# Patient Record
Sex: Female | Born: 1945
Health system: Southern US, Community
[De-identification: ages and names within clinical notes are randomized; demographics above are authoritative.]

## PROBLEM LIST (undated history)

## (undated) DIAGNOSIS — G35 Multiple sclerosis: Secondary | ICD-10-CM

## (undated) DIAGNOSIS — M810 Age-related osteoporosis without current pathological fracture: Secondary | ICD-10-CM

## (undated) DIAGNOSIS — E119 Type 2 diabetes mellitus without complications: Secondary | ICD-10-CM

## (undated) DIAGNOSIS — F329 Major depressive disorder, single episode, unspecified: Secondary | ICD-10-CM

## (undated) DIAGNOSIS — K219 Gastro-esophageal reflux disease without esophagitis: Secondary | ICD-10-CM

## (undated) DIAGNOSIS — F32A Depression, unspecified: Secondary | ICD-10-CM

## (undated) DIAGNOSIS — E785 Hyperlipidemia, unspecified: Secondary | ICD-10-CM

## (undated) HISTORY — PX: HIP FRACTURE SURGERY: SHX118

## (undated) HISTORY — DX: Multiple sclerosis: G35

## (undated) HISTORY — DX: Major depressive disorder, single episode, unspecified: F32.9

## (undated) HISTORY — DX: Depression, unspecified: F32.A

## (undated) HISTORY — DX: Gastro-esophageal reflux disease without esophagitis: K21.9

## (undated) HISTORY — DX: Hyperlipidemia, unspecified: E78.5

---

## 2017-10-23 ENCOUNTER — Other Ambulatory Visit: Payer: Self-pay

## 2017-10-23 ENCOUNTER — Emergency Department (HOSPITAL_COMMUNITY)
Admission: EM | Admit: 2017-10-23 | Discharge: 2017-10-23 | Disposition: A | Discharge status: Home / Self Care | Payer: Medicare Other | Attending: Emergency Medicine | Admitting: Emergency Medicine

## 2017-10-23 ENCOUNTER — Emergency Department (HOSPITAL_COMMUNITY): Payer: Medicare Other

## 2017-10-23 ENCOUNTER — Encounter (HOSPITAL_COMMUNITY): Payer: Self-pay | Admitting: Emergency Medicine

## 2017-10-23 DIAGNOSIS — M810 Age-related osteoporosis without current pathological fracture: Secondary | ICD-10-CM | POA: Diagnosis not present

## 2017-10-23 DIAGNOSIS — Z993 Dependence on wheelchair: Secondary | ICD-10-CM | POA: Diagnosis not present

## 2017-10-23 DIAGNOSIS — W230XXA Caught, crushed, jammed, or pinched between moving objects, initial encounter: Secondary | ICD-10-CM | POA: Insufficient documentation

## 2017-10-23 DIAGNOSIS — S93401A Sprain of unspecified ligament of right ankle, initial encounter: Secondary | ICD-10-CM

## 2017-10-23 DIAGNOSIS — E119 Type 2 diabetes mellitus without complications: Secondary | ICD-10-CM | POA: Insufficient documentation

## 2017-10-23 DIAGNOSIS — Y999 Unspecified external cause status: Secondary | ICD-10-CM | POA: Diagnosis not present

## 2017-10-23 DIAGNOSIS — G35 Multiple sclerosis: Secondary | ICD-10-CM | POA: Diagnosis not present

## 2017-10-23 DIAGNOSIS — Y929 Unspecified place or not applicable: Secondary | ICD-10-CM | POA: Insufficient documentation

## 2017-10-23 DIAGNOSIS — S99911A Unspecified injury of right ankle, initial encounter: Secondary | ICD-10-CM | POA: Diagnosis present

## 2017-10-23 DIAGNOSIS — Y939 Activity, unspecified: Secondary | ICD-10-CM | POA: Insufficient documentation

## 2017-10-23 DIAGNOSIS — T1490XA Injury, unspecified, initial encounter: Secondary | ICD-10-CM

## 2017-10-23 HISTORY — DX: Age-related osteoporosis without current pathological fracture: M81.0

## 2017-10-23 HISTORY — DX: Type 2 diabetes mellitus without complications: E11.9

## 2017-10-23 NOTE — ED Triage Notes (Signed)
Reports getting right foot twisted in wheelchair.  Having pain since.  Patient does not walk normally anyway.

## 2017-10-23 NOTE — Discharge Instructions (Signed)
Please read attached information regarding your condition. Wear Ace wrap as directed.  Continue taking ibuprofen or Tylenol as needed for pain. Follow-up with orthopedist listed below for further evaluation if symptoms persist. Continue your home medications as previously prescribed. Return to ED for worsening symptoms, additional injuries or falls, numbness in legs, red hot or tender joint.

## 2017-10-23 NOTE — Progress Notes (Signed)
Orthopedic Tech Progress Note Patient Details:  Claudia Walter [birth year] [id number]  Ortho Devices Type of Ortho Device: Ace wrap Ortho Device/Splint Location: rle ankle Ortho Device/Splint Interventions: Ordered, Application, Adjustment   Post Interventions Patient Tolerated: Well Instructions Provided: Care of device, Adjustment of device   Karolee Stamps 10/23/2017, 10:43 PM

## 2017-10-23 NOTE — ED Provider Notes (Signed)
[facility] EMERGENCY DEPARTMENT Provider Note   CSN: [id number] Arrival date & time: 10/23/17  1955     History   Chief Complaint Chief Complaint  Patient presents with  . Ankle Injury    HPI Claudia Walter is a 71 y.o. female history of multiple sclerosis, diabetes, osteoporosis, who presents to ED for evaluation of ankle pain and swelling for the past week.  She states that symptoms began after she was going down a ramp on her wheelchair (which she uses at baseline) when she got her ankle caught under the wheelchair.  She states that she did not fall out of the wheelchair, hit her head or lose consciousness.  States that over the past week she has had progressive pain worse with movement and swelling.  She has tried ibuprofen intermittently with mild relief in her symptoms.  She states she gets the most relief when she elevates her supports her foot.  She has not had any previous fracture, dislocations or procedures in the area.  She did have a hip fracture several years ago on the right side and reports decreased with range of motion at baseline since then.  She denies any numbness in the legs, wounds, back pain.  HPI  Past Medical History:  Diagnosis Date  . Diabetes mellitus without complication ([place])   . MS (multiple sclerosis) ([place])   . Osteoporosis     There are no active problems to display for this patient.   Past Surgical History:  Procedure Laterality Date  . CESAREAN SECTION N/A    X 2  . HIP FRACTURE SURGERY      OB History    No data available       Home Medications    Prior to Admission medications   Not on File    Family History No family history on file.  Social History Social History   Tobacco Use  . Smoking status: Former Research scientist (life sciences)  . Smokeless tobacco: Never Used  Substance Use Topics  . Alcohol use: Yes    Comment: occasionally  . Drug use: No     Allergies   Sulfa antibiotics   Review of Systems Review of  Systems  Constitutional: Negative for chills and fever.  Gastrointestinal: Negative for nausea and vomiting.  Musculoskeletal: Positive for arthralgias and joint swelling. Negative for back pain, gait problem, myalgias, neck pain and neck stiffness.  Skin: Negative for color change, rash and wound.  Neurological: Negative for weakness and numbness.     Physical Exam Updated Vital Signs BP (!) 160/79 (BP Location: Right Arm)   Pulse 91   Temp 98.4 F (36.9 C) (Oral)   Resp 18   Ht 5' (1.524 m)   Wt 68 kg (150 lb)   SpO2 95%   BMI 29.29 kg/m   Physical Exam  Constitutional: She appears well-developed and well-nourished. No distress.  Nontoxic-appearing and in no acute distress.  Pleasant.  HENT:  Head: Normocephalic and atraumatic.  Eyes: Conjunctivae and EOM are normal. No scleral icterus.  Neck: Normal range of motion.  Pulmonary/Chest: Effort normal. No respiratory distress.  Musculoskeletal: Normal range of motion. She exhibits edema and tenderness. She exhibits no deformity.  Mild edema and tenderness to palpation of the lateral malleolus of the right ankle.  Sensation intact to light touch of bilateral lower extremities.  2+ DP pulse noted bilaterally.  Patient unable to perform range of motion of toes, which she states is per her baseline.  Neurological: She  is alert.  Skin: No rash noted. She is not diaphoretic.  Psychiatric: She has a normal mood and affect.  Nursing note and vitals reviewed.    ED Treatments / Results  Labs (all labs ordered are listed, but only abnormal results are displayed) Labs Reviewed - No data to display  EKG  EKG Interpretation None       Radiology Dg Ankle 2 Views Right  Result Date: 10/23/2017 CLINICAL DATA:  Foot got caught in wheelchair with pain, initial encounter EXAM: RIGHT ANKLE - 2 VIEW COMPARISON:  None. FINDINGS: Generalized osteopenia is noted consistent with the given clinical history of immobility. Soft tissue  swelling is noted medially. No acute fracture or dislocation is noted. IMPRESSION: No acute fracture noted.  Soft tissue changes are seen medially. Electronically Signed   By: Inez Catalina M.D.   On: 10/23/2017 21:16    Procedures Procedures (including critical care time)  Medications Ordered in ED Medications - No data to display   Initial Impression / Assessment and Plan / ED Course  I have reviewed the triage vital signs and the nursing notes.  Pertinent labs & imaging results that were available during my care of the patient were reviewed by me and considered in my medical decision making (see chart for details).     Patient presents to ED for evaluation of right ankle pain for the past week after getting twisted in a wheelchair.  She is nonambulatory at baseline and wheelchair for transportation.  She does have a history of MS and osteoporosis.  She denies any other injuries, back pain, headache or vision changes.  She has some edema and tenderness noted at the lateral malleolus of the right ankle.  She has limited range of motion of the digits on the right foot states that this is similar to her baseline due to her MS.  She has no wounds, deficits on her neurological exam.  Her x-rays returned as negative for acute abnormalities. I have low suspicion for infectious or vascular cause of her symptoms. I suspect ligamentous injury or contusion.  Will give patient Ace wrap, advised her to continue anti-inflammatories and to follow-up with orthopedist if symptoms persist.  Patient appears stable for discharge at this time.  Strict return precautions given.  Patient discussed with and seen by Dr. Tamera Punt.  Final Clinical Impressions(s) / ED Diagnoses   Final diagnoses:  Sprain of right ankle, unspecified ligament, initial encounter    ED Discharge Orders    None     Portions of this note were generated with Dragon dictation software. Dictation errors may occur despite best attempts at  proofreading.    Delia Heady, PA-C 10/23/17 2200    Malvin Johns, MD 10/23/17 2303

## 2017-10-23 NOTE — ED Notes (Signed)
Patient transported to X-ray 

## 2017-12-14 ENCOUNTER — Ambulatory Visit: Payer: BLUE CROSS/BLUE SHIELD | Admitting: Family Medicine

## 2017-12-14 ENCOUNTER — Ambulatory Visit (INDEPENDENT_AMBULATORY_CARE_PROVIDER_SITE_OTHER): Payer: Medicare Other | Admitting: Family Medicine

## 2017-12-14 VITALS — BP 125/80 | HR 85 | Temp 98.2°F

## 2017-12-14 DIAGNOSIS — K449 Diaphragmatic hernia without obstruction or gangrene: Secondary | ICD-10-CM

## 2017-12-14 DIAGNOSIS — Z9359 Other cystostomy status: Secondary | ICD-10-CM

## 2017-12-14 DIAGNOSIS — E119 Type 2 diabetes mellitus without complications: Secondary | ICD-10-CM | POA: Diagnosis not present

## 2017-12-14 DIAGNOSIS — E782 Mixed hyperlipidemia: Secondary | ICD-10-CM | POA: Diagnosis not present

## 2017-12-14 DIAGNOSIS — D649 Anemia, unspecified: Secondary | ICD-10-CM

## 2017-12-14 DIAGNOSIS — G35 Multiple sclerosis: Secondary | ICD-10-CM | POA: Insufficient documentation

## 2017-12-14 DIAGNOSIS — E785 Hyperlipidemia, unspecified: Secondary | ICD-10-CM | POA: Insufficient documentation

## 2017-12-14 DIAGNOSIS — M810 Age-related osteoporosis without current pathological fracture: Secondary | ICD-10-CM

## 2017-12-14 DIAGNOSIS — N319 Neuromuscular dysfunction of bladder, unspecified: Secondary | ICD-10-CM

## 2017-12-14 DIAGNOSIS — E118 Type 2 diabetes mellitus with unspecified complications: Secondary | ICD-10-CM | POA: Insufficient documentation

## 2017-12-14 MED ORDER — BUPROPION HCL 75 MG PO TABS: 75.0000 mg | ORAL_TABLET | Freq: Every day | ORAL | 0 refills | Status: DC

## 2017-12-14 MED ORDER — CARBAMAZEPINE 200 MG PO TABS: 200.0000 mg | ORAL_TABLET | Freq: Two times a day (BID) | ORAL | 0 refills | Status: DC

## 2017-12-14 MED ORDER — GABAPENTIN 300 MG PO CAPS: 300.0000 mg | ORAL_CAPSULE | Freq: Two times a day (BID) | ORAL | 0 refills | Status: DC

## 2017-12-14 MED ORDER — POLYETHYLENE GLYCOL 3350 17 GM/SCOOP PO POWD: 17.0000 g | Freq: Once | ORAL | 3 refills | Status: AC

## 2017-12-14 MED ORDER — PRAVASTATIN SODIUM 20 MG PO TABS: 20.0000 mg | ORAL_TABLET | Freq: Every day | ORAL | 3 refills | Status: DC

## 2017-12-14 NOTE — Progress Notes (Signed)
    Subjective:    Patient ID: Claudia Walter, female    DOB: 07-13-1946, 72 y.o.   MRN: [id number]   CC: establish care  Moved here from [place]. Is established with urology and neurology in [place] already.  Todays concerns include: used to be on Prolia injections, did not get these in [place]. Had recent DEXA in [place]. She is concerned she should be on Prolia still.  Has air mattress obtained from home health service in [place], it is leaking air, unsure what she can do to get this fixed. She has not had a recent weight in >5 years. She is unable to be weighed on our scale in wheelchair. She knows how much chair weighs.  PMH- MS, neurogenic bladder with suprapubic cath, T2DM, hiatal hernia, osteoporosis Meds- ibuprofen as needed, pravastatin 20 mg, k-phos, metformin 500 every day, dulcolax, oxybutynin 5 mg, carbamazepine 200 mg BID, baclofen 20 mg BID, buproprion 75 mg daily, gabapentin 300 BID - (only taking once a day for gaba, carb, baclofen) Allergies- sulfa drugs Surg Hx- c section x 2, hip surgery x 2, suprapubic cath FH- colon cancer in father, mom DM, HTN, grandmother thyroid disease [place]- living w/ son and his GF, occasional alcohol, no drugs, no smoking  Smoking status reviewed- non-smoker  Review of Systems - denies fevers, chills, chest pain, SOB, decreased appetite, lethargy.   Objective:  BP 125/80 (Cuff Size: Normal)   Pulse 85   Temp 98.2 F (36.8 C) (Oral)   SpO2 99%  Vitals and nursing note reviewed  General: pleasant woman in wheelchair in NAD HEENT: normocephalic, moist mucous membranes Neck: supple, non-tender, without lymphadenopathy Cardiac: RRR, clear S1 and S2, no murmurs, rubs, or gallops Respiratory: clear to auscultation bilaterally, no increased work of breathing Abdomen: soft, nontender, nondistended, no masses or organomegaly. Bowel sounds present Neuro: wheelchair bound. Moves all extremities equally.   Assessment & Plan:    Type 2 diabetes  mellitus (HCC)  Will check A1C today. Continue current meds for now. Will inform patient of results and make adjustments from there. Check BMP to assess kidney function today.  Hiatal hernia  Patient states this was followed by GI in [place]. She would like to be referred to GI again for this issue. Referral placed.  Multiple sclerosis ([place])  Patient in need of [place] aide, nurse for suprapubic cath changes. Will order HH. Patient has appt w/ neurology next month. Refilled some of her medications to get her to this appointment.  Osteoporosis  Will obtain records and review most recent DEXA. Will decide course of treatment based on these results.  Hyperlipidemia  Check lipid panel today.  Anemia  Check CBC today.   Return in about 4 weeks (around 01/11/2018).   Lucila Maine, DO Family Medicine Resident PGY-2

## 2017-12-14 NOTE — Patient Instructions (Signed)
   It was great seeing you today!  I'd like to review your records closely and go from there.  I'll order the home health nurse for you to help with catheter changes.   If you have questions or concerns please do not hesitate to call at [phone].  Lucila Maine, DO PGY-2, [facility] 12/14/2017 3:14 PM

## 2017-12-15 DIAGNOSIS — D649 Anemia, unspecified: Secondary | ICD-10-CM | POA: Insufficient documentation

## 2017-12-15 DIAGNOSIS — K449 Diaphragmatic hernia without obstruction or gangrene: Secondary | ICD-10-CM | POA: Insufficient documentation

## 2017-12-15 LAB — BASIC METABOLIC PANEL
BUN/Creatinine Ratio: 24 (ref 12–28)
BUN: 19 mg/dL (ref 8–27)
CALCIUM: 9.6 mg/dL (ref 8.7–10.3)
CO2: 23 mmol/L (ref 20–29)
CREATININE: 0.78 mg/dL (ref 0.57–1.00)
Chloride: 99 mmol/L (ref 96–106)
GFR calc Af Amer: 88 mL/min/{1.73_m2} (ref >59)
GFR calc non Af Amer: 76 mL/min/{1.73_m2} (ref >59)
GLUCOSE: 96 mg/dL (ref 65–99)
POTASSIUM: 4.5 mmol/L (ref 3.5–5.2)
SODIUM: 141 mmol/L (ref 134–144)

## 2017-12-15 LAB — LIPID PANEL
CHOL/HDL RATIO: 6.1 ratio — AB (ref 0.0–4.4)
CHOLESTEROL TOTAL: 250 mg/dL — AB (ref 100–199)
HDL: 41 mg/dL (ref >39)
LDL CALC: 159 mg/dL — AB (ref 0–99)
TRIGLYCERIDES: 249 mg/dL — AB (ref 0–149)
VLDL CHOLESTEROL CAL: 50 mg/dL — AB (ref 5–40)

## 2017-12-15 LAB — CBC
Hematocrit: 35.7 % (ref 34.0–46.6)
Hemoglobin: 11.7 g/dL (ref 11.1–15.9)
MCH: 27.3 pg (ref 26.6–33.0)
MCHC: 32.8 g/dL (ref 31.5–35.7)
MCV: 83 fL (ref 79–97)
PLATELETS: 437 10*3/uL — AB (ref 150–379)
RBC: 4.29 x10E6/uL (ref 3.77–5.28)
RDW: 14.2 % (ref 12.3–15.4)
WBC: 8.3 10*3/uL (ref 3.4–10.8)

## 2017-12-15 LAB — HEMOGLOBIN A1C
ESTIMATED AVERAGE GLUCOSE: 140 mg/dL
Hgb A1c MFr Bld: 6.5 % — ABNORMAL HIGH (ref 4.8–5.6)

## 2017-12-15 NOTE — Assessment & Plan Note (Signed)
Check lipid panel today 

## 2017-12-15 NOTE — Assessment & Plan Note (Signed)
  Will obtain records and review most recent DEXA. Will decide course of treatment based on these results.

## 2017-12-15 NOTE — Assessment & Plan Note (Signed)
Check CBC today.  

## 2017-12-15 NOTE — Assessment & Plan Note (Signed)
  Patient in need of [facility] aide, nurse for suprapubic cath changes. Will order HH. Patient has appt w/ neurology next month. Refilled some of her medications to get her to this appointment.

## 2017-12-15 NOTE — Assessment & Plan Note (Addendum)
  Will check A1C today. Continue current meds for now. Will inform patient of results and make adjustments from there. Check BMP to assess kidney function today.

## 2017-12-15 NOTE — Assessment & Plan Note (Signed)
  Patient states this was followed by GI in [place]. She would like to be referred to GI again for this issue. Referral placed.

## 2017-12-17 ENCOUNTER — Telehealth: Payer: Self-pay | Admitting: Family Medicine

## 2017-12-17 ENCOUNTER — Encounter: Payer: Self-pay | Admitting: Gastroenterology

## 2017-12-17 ENCOUNTER — Encounter: Payer: Self-pay | Admitting: Family Medicine

## 2017-12-17 NOTE — Telephone Encounter (Signed)
  Called Ms. Brackeen to review labs. A1C 6.5. Cholesterol panel elevated. She reports she hasn't been taking it faithfully like she should; forgets occasionally. She reports she will start taking it more. Does not want to increase dose at this point. She is requesting copy of her labs.   Lucila Maine, DO PGY-2, [facility] 12/17/2017 11:50 AM

## 2018-01-04 ENCOUNTER — Ambulatory Visit: Payer: BLUE CROSS/BLUE SHIELD | Admitting: Family Medicine

## 2018-01-06 ENCOUNTER — Ambulatory Visit: Payer: Medicare Other | Admitting: Family Medicine

## 2018-01-14 ENCOUNTER — Other Ambulatory Visit: Payer: Self-pay

## 2018-01-14 MED ORDER — PRAVASTATIN SODIUM 20 MG PO TABS: 20.0000 mg | ORAL_TABLET | Freq: Every day | ORAL | 3 refills | Status: DC

## 2018-01-18 ENCOUNTER — Ambulatory Visit (INDEPENDENT_AMBULATORY_CARE_PROVIDER_SITE_OTHER): Payer: Medicare Other | Admitting: Neurology

## 2018-01-18 ENCOUNTER — Telehealth: Payer: Self-pay | Admitting: Neurology

## 2018-01-18 ENCOUNTER — Encounter: Payer: Self-pay | Admitting: Neurology

## 2018-01-18 VITALS — BP 116/65 | HR 95

## 2018-01-18 DIAGNOSIS — G35 Multiple sclerosis: Secondary | ICD-10-CM | POA: Diagnosis not present

## 2018-01-18 DIAGNOSIS — N319 Neuromuscular dysfunction of bladder, unspecified: Secondary | ICD-10-CM

## 2018-01-18 DIAGNOSIS — Z9359 Other cystostomy status: Secondary | ICD-10-CM | POA: Diagnosis not present

## 2018-01-18 MED ORDER — GABAPENTIN 300 MG PO CAPS: 300.0000 mg | ORAL_CAPSULE | Freq: Two times a day (BID) | ORAL | 4 refills | Status: DC

## 2018-01-18 MED ORDER — BUPROPION HCL 75 MG PO TABS: 75.0000 mg | ORAL_TABLET | Freq: Every day | ORAL | 4 refills | Status: DC

## 2018-01-18 NOTE — Telephone Encounter (Signed)
Please call her wheelchair company,   [employer] in [place]  At [phone]  To fix her old wheelchair, and sitting clinic, evaluate a new chair, last evaluation was in 2009

## 2018-01-18 NOTE — Progress Notes (Signed)
PATIENT: Claudia Walter DOB: Feb 25, 1946  Chief Complaint  Patient presents with  . Multiple Sclerosis    She is here to establish new neurological care. She was diagnosed with MS in 1994.  She has been on Betaseron and Copaxone in the past.  She has been off medications completely for the last four years.  She is wheelchair bound and has a suprapubic catheter.  She lives with her son, his girlfriend and her children.    Marland Kitchen PCP    [name], DO (previously treated by Dr. [name] at [facility])     HISTORICAL  Claudia Walter is a 72 year old female, seen in refer by primary care physician [name], for evaluation of relapsing remitting multiple sclerosis,  She was brought in by transportation, in electronic wheelchair, alone at today's clinical visit,  I was able to review old her previous neurological evaluation from [facility] by Dr.   [name], most recent note was dated May 14, 2017,  She is a retired Public relations account executive, was diagnosed with multiple sclerosis since 1993, she presented with thoracic myelopathy, initial symptoms involved pinching around her waist felt like tightening sensation, paresthesia involving all limbs, lumbar puncture was positive, Evoked potential was positive,  She had slowly progressive worsening gait abnormality, remembered in 2000, after her mother passed away, she began to ambulate with a cane, in 2005, after her husband passed away, she began to use her walker, has to quit her job due to worsening gait difficulty, was given a course of IV steroid, did improve her strength temporarily, Betaseron since 2004, she moved to [place] in 2007 due to family stress, was followed by local neurologist [name] at, , [facility], noticed continue progressive gait abnormality, eventually begin to use wheelchair in 2009, but still able to take a few steps with his walker, switched to  Copaxone in 2013,   In 2014 she injured her right hip, later also suffered right tibial and fibular fracture, was put in place for 8 weeks, was never able to walk again, she also stopped long-term immunomodification therapy since 2014, had a short trial of Ampyra without helping her gait.   Per record, last MRI was in 2018, showed stable hemisphere lesions,  Laboratory evaluation per record showed: vitamin D was 17, she is JC virus positive with index of 0.41, Tegretol level was 9.4,  She is now living with her son, and his girlfriend and children, she is tearful during today's interview, as being her wheelchair for 10 years, it has become malfunction, she has more difficulty positioning herself in the wheelchair, also complains of worsening bilateral hip pain due to difficulty positioning, developed decubitus ulcer.  She had suprapubic catheter for 10 years, was recently evaluated by urologist [name] at [facility] urologist, ultrasound of kidney was reported normal  MRI of the brain without contrast in November 2018 from [place] reported mild cerebral atrophy, with proportional ventricular dilatation, scattered supratentorium periventricular, and subcortical white matter hyper intensity, was reported as stable,  Tegretol 200mg  help her Laboratory evaluation in June 2018, normal CBC hemoglobin of 12.8, CMP creatinine of 0.76, normal TSH 3.78, vitamin D of 41, mild elevated alkaline phosphate 158, B12 is 564,  REVIEW OF SYSTEMS: Full 14 system review of systems performed and notable only for joint pain, achy muscles, constipation depression  ALLERGIES: Allergies  Allergen Reactions  . Sulfa Antibiotics Nausea And Vomiting  HOME MEDICATIONS: Current Outpatient Medications  Medication Sig Dispense Refill  . BACLOFEN PO Take 40 mg by mouth 2 (two) times daily.    Marland Kitchen buPROPion (WELLBUTRIN) 75 MG tablet Take 1 tablet (75 mg total) by mouth daily. 30 tablet 0  . carbamazepine  (TEGRETOL) 200 MG tablet Take 1 tablet (200 mg total) by mouth 2 (two) times daily. 60 tablet 0  . gabapentin (NEURONTIN) 300 MG capsule Take 1 capsule (300 mg total) by mouth 2 (two) times daily. 60 capsule 0  . HYDROCHLOROTHIAZIDE PO Take 25 mg by mouth daily.    . metFORMIN (GLUCOPHAGE) 500 MG tablet Take 500 mg by mouth daily.    . OXYBUTYNIN CHLORIDE PO Take 5 mg by mouth daily.    . pravastatin (PRAVACHOL) 20 MG tablet Take 1 tablet (20 mg total) by mouth daily. 90 tablet 3   No current facility-administered medications for this visit.     PAST MEDICAL HISTORY: Past Medical History:  Diagnosis Date  . Depression   . Diabetes mellitus without complication ([place])   . Hyperlipemia   . MS (multiple sclerosis) ([place])   . Osteoporosis     PAST SURGICAL HISTORY: Past Surgical History:  Procedure Laterality Date  . CESAREAN SECTION N/A    X 2  . HIP FRACTURE SURGERY Right     FAMILY HISTORY: Family History  Problem Relation Age of Onset  . Diabetes Mother   . Heart attack Mother   . Colon cancer Father     SOCIAL HISTORY:  Social History   Socioeconomic History  . Marital status: Widowed    Spouse name: Not on file  . Number of children: 2  . Years of education: college  . Highest education level: Bachelor's degree (e.g., BA, AB, BS)  Occupational History  . Occupation: Disabled  Social Needs  . Financial resource strain: Not on file  . Food insecurity:    Worry: Not on file    Inability: Not on file  . Transportation needs:    Medical: Not on file    Non-medical: Not on file  Tobacco Use  . Smoking status: Former Smoker    Types: Cigarettes    Last attempt to quit: 1978    Years since quitting: 41.2  . Smokeless tobacco: Never Used  Substance and Sexual Activity  . Alcohol use: Yes    Comment: occasional glass of wine  . Drug use: Never  . Sexual activity: Not on file  Lifestyle  . Physical activity:    Days per week: Not on file    Minutes per  session: Not on file  . Stress: Not on file  Relationships  . Social connections:    Talks on phone: Not on file    Gets together: Not on file    Attends religious service: Not on file    Active member of club or organization: Not on file    Attends meetings of clubs or organizations: Not on file    Relationship status: Not on file  . Intimate partner violence:    Fear of current or ex partner: Not on file    Emotionally abused: Not on file    Physically abused: Not on file    Forced sexual activity: Not on file  Other Topics Concern  . Not on file  Social History Narrative   Lives at home with her son, his girlfriend and her children.   Left-handed.   1-2 cups caffeine daily.  PHYSICAL EXAM   Vitals:   01/18/18 1415  BP: 116/65  Pulse: 95    Not recorded      There is no height or weight on file to calculate BMI.  PHYSICAL EXAMNIATION:  Gen: NAD, conversant, well nourised, obese, well groomed                     Cardiovascular: Regular rate rhythm, no peripheral edema, warm, nontender. Eyes: Conjunctivae clear without exudates or hemorrhage Neck: Supple, no carotid bruits. Pulmonary: Clear to auscultation bilaterally   NEUROLOGICAL EXAM:  MENTAL STATUS: Depressed looking elderly female, sitting her wheelchair, Speech:    Speech is normal; fluent and spontaneous with normal comprehension.  Cognition:     Orientation to time, place and person     Normal recent and remote memory     Normal Attention span and concentration     Normal Language, naming, repeating,spontaneous speech     Fund of knowledge   CRANIAL NERVES: CN II: Visual fields are full to confrontation.   Pupils are round equal and briskly reactive to light. CN III, IV, VI: extraocular movement are normal. No ptosis. CN V: Facial sensation is intact to pinprick in all 3 divisions bilaterally. Corneal responses are intact.  CN VII: Face is symmetric with normal eye closure and smile. CN VIII:  Hearing is normal to rubbing fingers CN IX, X: Palate elevates symmetrically. Phonation is normal. CN XI: Head turning and shoulder shrug are intact CN XII: Tongue is midline with normal movements and no atrophy.  MOTOR: She can move bilateral upper extremity against gravity, but complains of mild difficulty with left upper extremity during left shoulder pain, she has no bilateral lower extremity proximal and distal muscles.  REFLEXES: Reflexes are 2+ and symmetric at the biceps, triceps, absent at knees and ankles. Plantar responses are flexor.  SENSORY: Withdraw to pain  COORDINATION:  There is no dysmetria on finger-to-nose  GAIT/STANCE: Nonambulatory   DIAGNOSTIC DATA (LABS, IMAGING, TESTING) - I reviewed patient records, labs, notes, testing and imaging myself where available.   ASSESSMENT AND PLAN  Keyetta Hollingworth is a 72 y.o. female   Multiple sclerosis, likely secondary progressive  Home physical therapy, wound care  Electronic wheelchair evaluations,  She has stopped long-term immunomodulation therapy since 2014,  Will consider repeat MRI of the brain, cervical, thoracic spine if she can continue follow-up    Marcial Pacas, M.D. Ph.D.  [facility] [address], [place], [place] [address] Ph: [phone] Fax: [phone]  CC: [name], DO

## 2018-01-20 ENCOUNTER — Other Ambulatory Visit: Payer: Self-pay

## 2018-01-20 ENCOUNTER — Ambulatory Visit (INDEPENDENT_AMBULATORY_CARE_PROVIDER_SITE_OTHER): Payer: Medicare Other | Admitting: Family Medicine

## 2018-01-20 ENCOUNTER — Encounter: Payer: Self-pay | Admitting: Family Medicine

## 2018-01-20 VITALS — BP 118/58 | HR 98 | Temp 98.1°F | Ht 60.0 in

## 2018-01-20 DIAGNOSIS — M25512 Pain in left shoulder: Secondary | ICD-10-CM

## 2018-01-20 MED ORDER — PREDNISONE 50 MG PO TABS: 50.0000 mg | ORAL_TABLET | Freq: Every day | ORAL | 1 refills | Status: DC

## 2018-01-20 NOTE — Telephone Encounter (Signed)
Called [employer] and spoke to Conway.  She informed me the company went out to the patient's house on 01/12/18 for a service call.  They have sent orders to her insurance plan for approval of repair and/or replacement.  They will eventually need paperwork from our office and will fax it over for completion at the time it is needed.

## 2018-01-20 NOTE — Progress Notes (Signed)
    Subjective:    Patient ID: Claudia Walter, female    DOB: 06-26-1946, 72 y.o.   MRN: [id number]   CC: left shoulder pain  Hurt arm picking up a bag 2 weeks ago Very sore, throbbing throughout day Taking ibuprofen 600 mg 1 to 2 times a day with minimal relief Also using topical tiger balm which has worked in the past w/o relief Certain movements make it hurt worse- doing hair, lifting items Resting in bed makes pain better Denies weakness in arms, numbness or tingling Has some associated pain radiating up to the back of her neck  Smoking status reviewed- former smoker  Review of Systems- see HPI   Objective:  BP (!) 118/58   Pulse 98   Temp 98.1 F (36.7 C) (Oral)   Ht 5' (1.524 m)   SpO2 93%   BMI 29.29 kg/m  Vitals and nursing note reviewed  General: well nourished, in no acute distress HEENT: normocephalic, moist mucous membranes Neck: supple, full ROM. Tender to palpation of left sided paraspinal muscles.  Cardiac: RRR, clear S1 and S2, no murmurs, rubs, or gallops Respiratory: clear to auscultation bilaterally, no increased work of breathing Extremities: decreased ROM in shoulder abduction on left. Positive empty can test. Neurovascularly in tact distally. Skin: warm and dry, no rashes noted Neuro: alert and oriented, no focal deficits. Strength 5/5 in upper extremities bilaterally, sensation in tact.    Assessment & Plan:    1. Acute pain of left shoulder Consistent with acute rotator cuff tendinitis. No muscle weakness or numbness/tingling. Continue 600 mg ibuprofen, increase to q8 hours. Add 50 mg prednisone x 5 days. Continue topical measures including ice or heat as needed. If no improvement, would refer to ortho for further management. Patient verbalized understanding and agreement with plan.   Return if symptoms worsen or fail to improve.   Lucila Maine, DO Family Medicine Resident PGY-2

## 2018-01-20 NOTE — Patient Instructions (Signed)
It was great seeing you today!  Please let me know if you are still having pain in a week. I'll refer you to ortho if no improvement.   If you have questions or concerns please do not hesitate to call at [phone].  Lucila Maine, DO PGY-2, [facility] 01/20/2018 2:34 PM   Rotator Cuff Tendinitis Rotator cuff tendinitis is inflammation of the tough, cord-like bands that connect muscle to bone (tendons) in the rotator cuff. The rotator cuff includes all of the muscles and tendons that connect the arm to the shoulder. The rotator cuff holds the head of the upper arm bone (humerus) in the cup (fossa) of the shoulder blade (scapula). This condition can lead to a long-lasting (chronic) tear. The tear may be partial or complete. What are the causes? This condition is usually caused by overusing the rotator cuff. What increases the risk? This condition is more likely to develop in athletes and workers who frequently use their shoulder or reach over their heads. This can include activities such as:  Tennis.  Baseball or softball.  Swimming.  Construction work.  Painting.  What are the signs or symptoms? Symptoms of this condition include:  Pain spreading (radiating) from the shoulder to the upper arm.  Swelling and tenderness in front of the shoulder.  Pain when reaching, pulling, or lifting the arm above the head.  Pain when lowering the arm from above the head.  Minor pain in the shoulder when resting.  Increased pain in the shoulder at night.  Difficulty placing the arm behind the back.  How is this diagnosed? This condition is diagnosed with a medical history and physical exam. Tests may also be done, including:  X-rays.  MRI.  Ultrasounds.  CT or MR arthrogram. During this test, a contrast material is injected and then images are taken.  How is this treated? Treatment for this condition depends on the severity of the condition. In less  severe cases, treatment may include:  Rest. This may be done with a sling that holds the shoulder still (immobilization). Your health care provider may also recommend avoiding activities that involve lifting your arm over your head.  Icing the shoulder.  Anti-inflammatory medicines, such as aspirin or ibuprofen.  In more severe cases, treatment may include:  Physical therapy.  Steroid injections.  Surgery.  Follow these instructions at home: If you have a sling:  Wear the sling as told by your health care provider. Remove it only as told by your health care provider.  Loosen the sling if your fingers tingle, become numb, or turn cold and blue.  Keep the sling clean.  If the sling is not waterproof, do not let it get wet. Remove it, if allowed, or cover it with a watertight covering when you take a bath or shower. Managing pain, stiffness, and swelling  If directed, put ice on the injured area. ? If you have a removable sling, remove it as told by your health care provider. ? Put ice in a plastic bag. ? Place a towel between your skin and the bag. ? Leave the ice on for 20 minutes, 2-3 times a day.  Move your fingers often to avoid stiffness and to lessen swelling.  Raise (elevate) the injured area above the level of your heart while you are lying down.  Find a comfortable sleeping position or sleep on a recliner, if available. Driving  Do not drive or use heavy machinery while taking prescription pain medicine.  Ask your health care provider when it is safe to drive if you have a sling on your arm. Activity  Rest your shoulder as told by your health care provider.  Return to your normal activities as told by your health care provider. Ask your health care provider what activities are safe for you.  Do any exercises or stretches as told by your health care provider.  If you do repetitive overhead tasks, take small breaks in between and include stretching exercises  as told by your health care provider. General instructions  Do not use any products that contain nicotine or tobacco, such as cigarettes and e-cigarettes. These can delay healing. If you need help quitting, ask your health care provider.  Take over-the-counter and prescription medicines only as told by your health care provider.  Keep all follow-up visits as told by your health care provider. This is important. Contact a health care provider if:  Your pain gets worse.  You have new pain in your arm, hands, or fingers.  Your pain is not relieved with medicine or does not get better after 6 weeks of treatment.  You have cracking sensations when moving your shoulder in certain directions.  You hear a snapping sound after using your shoulder, followed by severe pain and weakness. Get help right away if:  Your arm, hand, or fingers are numb or tingling.  Your arm, hand, or fingers are swollen or painful or they turn white or blue. Summary  Rotator cuff tendinitis is inflammation of the tough, cord-like bands that connect muscle to bone (tendons) in the rotator cuff.  This condition is usually caused by overusing the rotator cuff, which includes all of the muscles and tendons that connect the arm to the shoulder.  This condition is more likely to develop in athletes and workers who frequently use their shoulder or reach over their heads.  Treatment generally includes rest, anti-inflammatory medicines, and icing. In some cases, physical therapy and steroid injections may be needed. In severe cases, surgery may be needed. This information is not intended to replace advice given to you by your health care provider. Make sure you discuss any questions you have with your health care provider. Document Released: 01/02/2004 Document Revised: 09/28/2016 Document Reviewed: 09/28/2016 Elsevier Interactive Patient Education  2017 [employer].

## 2018-01-26 ENCOUNTER — Telehealth: Payer: Self-pay | Admitting: Family Medicine

## 2018-01-26 DIAGNOSIS — M810 Age-related osteoporosis without current pathological fracture: Secondary | ICD-10-CM

## 2018-01-26 NOTE — Telephone Encounter (Signed)
  Patient requesting Prolia for osteoporosis. I had requested records from prior provider in [place] which I got, but there is no DEXA scan report in there to go off of. We need to call provider and request DEXA specifically, otherwise repeat scan.   She used to be seen at [facility], [place] [phone]- Phone number.

## 2018-01-26 NOTE — Telephone Encounter (Signed)
Office closed will try again in the morning. Jelani Trueba,CMA

## 2018-01-28 NOTE — Telephone Encounter (Signed)
Spoke with Tamika with their office and she will be faxing the Dexa scan from 2016 to our office.  Will call her back after lunch if it is not received.  Oluwatimilehin Balfour,CMA

## 2018-01-28 NOTE — Telephone Encounter (Signed)
Record received and placed in PCPs box.  It was suggested that patient have a repeat DEXA in 3-5 years, but will wait for provider to order it and then we can call to schedule it. Jazmin Hartsell,CMA

## 2018-01-31 ENCOUNTER — Encounter: Payer: Self-pay | Admitting: Gastroenterology

## 2018-01-31 ENCOUNTER — Ambulatory Visit (INDEPENDENT_AMBULATORY_CARE_PROVIDER_SITE_OTHER): Payer: Medicare Other | Admitting: Gastroenterology

## 2018-01-31 VITALS — BP 110/58 | HR 104

## 2018-01-31 DIAGNOSIS — G35 Multiple sclerosis: Secondary | ICD-10-CM

## 2018-01-31 DIAGNOSIS — K5909 Other constipation: Secondary | ICD-10-CM

## 2018-01-31 DIAGNOSIS — K219 Gastro-esophageal reflux disease without esophagitis: Secondary | ICD-10-CM | POA: Diagnosis not present

## 2018-01-31 MED ORDER — OMEPRAZOLE 20 MG PO CPDR: 20.0000 mg | DELAYED_RELEASE_CAPSULE | Freq: Every day | ORAL | 1 refills | Status: DC

## 2018-01-31 MED ORDER — LINACLOTIDE 145 MCG PO CAPS: 145.0000 ug | ORAL_CAPSULE | Freq: Every day | ORAL | 0 refills | Status: DC

## 2018-01-31 NOTE — Progress Notes (Signed)
HPI :  72 y/o female with history of MS, depression, HLD, osteoporosis, here for new patient evaluation for reflux. She is wheelchair bound for history of MS but otherwise in good health.  She states she has had longstanding symptoms of reflux for years. Main symptoms include pyrosis and regurgitation. No dysphagia or odynophagia. This has bothered her for years. Initially had an EGD done while in [place], 04/23/2009, by Dr. Donivan Scull showing LA grade B esophagitis with a "large" hiatal hernia. She was placed on omeprazole 40mg  / day at that time which she states worked really well to treat her symptoms. Over time she was able to come off the omeprazole and symptoms had not bothered her for a while. More recently she has had recurrence of symptoms. When using a different wheel chair, one in which sat her up at a different angle, she thinks she had more reflux symptoms. Not using anything for her symptoms at present time. No dysphagia. Some decreased appetite with some occasional "gas pains". She never had a follow up EGD.  She's also had some constipation bothering her. She endorses having a bowel movement about once per week without blood in the stools noted. She is using Miralax once daily which helps somewhat but does not have any significant benefit from it. She has not tried higher dosing. She is using ibuprofen twice daily for chronic pains.   No FH of esophageal cancer. Father had CRC diagnosed at age 33s. She's had 2 prior colonoscopies done but no reports available, unclear when the last one was done.   Past Medical History:  Diagnosis Date  . Depression   . Diabetes mellitus without complication ([place])   . Hyperlipemia   . MS (multiple sclerosis) ([place])   . Osteoporosis      Past Surgical History:  Procedure Laterality Date  . CESAREAN SECTION N/A    X 2  . HIP FRACTURE SURGERY Right    Family History  Problem Relation Age of Onset  . Diabetes Mother   . Heart attack  Mother   . Colon cancer Father    Social History   Tobacco Use  . Smoking status: Former Smoker    Types: Cigarettes    Last attempt to quit: 1978    Years since quitting: 41.2  . Smokeless tobacco: Never Used  Substance Use Topics  . Alcohol use: Yes    Comment: occasional glass of wine  . Drug use: Never   Current Outpatient Medications  Medication Sig Dispense Refill  . BACLOFEN PO Take 40 mg by mouth 2 (two) times daily.    Marland Kitchen buPROPion (WELLBUTRIN) 75 MG tablet Take 1 tablet (75 mg total) by mouth daily. 90 tablet 4  . gabapentin (NEURONTIN) 300 MG capsule Take 1 capsule (300 mg total) by mouth 2 (two) times daily. 180 capsule 4  . HYDROCHLOROTHIAZIDE PO Take 25 mg by mouth daily.    Marland Kitchen ibuprofen (ADVIL,MOTRIN) 600 MG tablet Take 600-1,200 mg by mouth daily.    . metFORMIN (GLUCOPHAGE) 500 MG tablet Take 500 mg by mouth daily.    . OXYBUTYNIN CHLORIDE PO Take 5 mg by mouth daily.    . pravastatin (PRAVACHOL) 20 MG tablet Take 1 tablet (20 mg total) by mouth daily. 90 tablet 3  . predniSONE (DELTASONE) 50 MG tablet Take 1 tablet (50 mg total) by mouth daily with breakfast. 5 tablet 1   No current facility-administered medications for this visit.    Allergies  Allergen Reactions  .  Sulfa Antibiotics Nausea And Vomiting     Review of Systems: All systems reviewed and negative except where noted in HPI.   Lab Results  Component Value Date   WBC 8.3 12/14/2017   HGB 11.7 12/14/2017   HCT 35.7 12/14/2017   MCV 83 12/14/2017   PLT 437 (H) 12/14/2017    Lab Results  Component Value Date   CREATININE 0.78 12/14/2017   BUN 19 12/14/2017   NA 141 12/14/2017   K 4.5 12/14/2017   CL 99 12/14/2017   CO2 23 12/14/2017    No results found for: ALT, AST, GGT, ALKPHOS, BILITOT    Physical Exam: BP (!) 110/58   Pulse (!) 104  Constitutional: Pleasant,wemale in no acute distress, in wheelchair HEENT: Normocephalic and atraumatic. Conjunctivae are normal. No scleral  icterus. Neck supple.  Cardiovascular: Normal rate, regular rhythm.  Pulmonary/chest: Effort normal and breath sounds normal. No wheezing, rales or rhonchi. Abdominal: Soft, nondistended, nontender.There are no masses palpable. No hepatomegaly. Extremities: no edema Lymphadenopathy: No cervical adenopathy noted. Neurological: Alert and oriented to person place and time. Skin: Skin is warm and dry. No rashes noted. Psychiatric: Normal mood and affect. Behavior is normal.   ASSESSMENT AND PLAN: 72 year old female with a history of multiple sclerosis leading to wheelchair dependence, here for new patient visit regarding reflux and chronic constipation.  GERD - recurrence of symptoms, which may potentially be related to positional changes in her wheelchair.  Prior EGD reviewed, she had grade B esophagitis at the time of her last exam, has not had a follow-up exam to rule out Barrett's.  This is in the setting of a large hiatal hernia as well.  Recommend we resume an antacid to get a control of symptoms.  Recommending starting with omeprazole 20 mg once to twice daily, using the lowest dose needed to control symptoms.  I offered her a follow-up of upper endoscopy in a few months to reassess for interval healing of the esophagus and rule out Barrett's esophagus. If no evidence of Barrett's may want to manage her reflux with H2 blocker if possible given history of osteoporosis. I discussed the risks and benefits of endoscopy with her, following discussion of the she want to proceed with an exam.  If her symptoms fail to respond to omeprazole interim I asked her to contact me.  Chronic constipation - using low-dose MiraLAX which has provided some benefit but she remains largely symptomatic.  I discussed other options with her to include simply increasing the dose of her MiraLAX as needed versus other options such as a trial of Linzess.  She want to try Linzess is a suspect she will need something stronger  MiraLAX to help with the symptoms.  We will start her on 145 mcg once a day, free sample provided for 2 weeks.  If this works for her we can fill a prescription.  I otherwise we will reach out to her previous GI physician and obtain records from her last colonoscopy to clarify when she is next due for screening.  She agreed with the plan.  Leando Cellar, MD [facility]

## 2018-01-31 NOTE — Patient Instructions (Addendum)
If you are age 73 or older, your body mass index should be between 23-30. Your There is no height or weight on file to calculate BMI. If this is out of the aforementioned range listed, please consider follow up with your Primary Care Provider.  If you are age 62 or younger, your body mass index should be between 19-25. Your There is no height or weight on file to calculate BMI. If this is out of the aformentioned range listed, please consider follow up with your Primary Care Provider.   You have been scheduled for an endoscopy. Please follow written instructions given to you at your visit today. If you use inhalers (even only as needed), please bring them with you on the day of your procedure. Your physician has requested that you go to www.startemmi.com and enter the access code given to you at your visit today. This web site gives a general overview about your procedure. However, you should still follow specific instructions given to you by our office regarding your preparation for the procedure.   We have sent the following medications to your pharmacy for you to pick up at your convenience: Omeprazole 20 mg: Take 1 to 2 capsules once a day  We have given you samples of the following medication to take: Linzess 145 mcg  Thank you for entrusting me with your care and for choosing [employer], Dr. [place] Cellar

## 2018-02-02 ENCOUNTER — Telehealth: Payer: Self-pay

## 2018-02-02 ENCOUNTER — Other Ambulatory Visit: Payer: Self-pay | Admitting: Family Medicine

## 2018-02-02 MED ORDER — DENOSUMAB 60 MG/ML ~~LOC~~ SOLN: 60.0000 mg | SUBCUTANEOUS | 0 refills | Status: DC

## 2018-02-02 NOTE — Telephone Encounter (Signed)
  Order placed. T-score -3.0 at femur. Will order prolia for patient.

## 2018-02-02 NOTE — Telephone Encounter (Signed)
Received fax from [facility] requesting prior authorization of Prolia. PA completed via CoverMyMeds. Status pending. Will check status in 24 hours. Danley Danker, RN [facility] RN)

## 2018-02-04 ENCOUNTER — Telehealth: Payer: Self-pay

## 2018-02-04 NOTE — Telephone Encounter (Signed)
Caryl Pina- RN with [facility] calling for orders for PT, OT and Home Health Aid. Her call back for VO [phone] Wallace Cullens, RN

## 2018-02-04 NOTE — Telephone Encounter (Signed)
Insurer has sent clinical questions that must be completed. I have answered the first page but PCP will need to complete the rest. Placed in PCP's box. Danley Danker, RN [facility] RN)

## 2018-02-04 NOTE — Telephone Encounter (Signed)
HH orders verbally given to [facility].   Lucila Maine, DO PGY-2, [facility] 02/04/2018 4:18 PM

## 2018-02-07 NOTE — Telephone Encounter (Signed)
Received fax from [facility] that [place] approved from 02/07/18 - 02/05/2021. [facility] notified. Danley Danker, RN [facility] RN)

## 2018-02-08 ENCOUNTER — Telehealth: Payer: Self-pay | Admitting: Family Medicine

## 2018-02-08 NOTE — Telephone Encounter (Signed)
Caryl Pina from [facility] called concerning pt. They wanted to let Dr Vanetta Shawl know that Claudia Walter is having lower abdominal pain and is seeing Dr Karsten Ro for these issues. Caryl Pina also would like to have a verbal request for the Pt to have additional skilled nurse visits for her ulcer on her buttock. The best number to contact Caryl Pina is [phone].

## 2018-02-08 NOTE — Telephone Encounter (Signed)
Returned call to [place] for verbal orders for skilled nursing re: pressure ulcer. LM w/ verbal orders.

## 2018-02-08 NOTE — Telephone Encounter (Signed)
Thank you, patient may bring this medication into our office for RN visit to administer. Thanks!

## 2018-02-08 NOTE — Telephone Encounter (Signed)
Will forward to MD. Jazmin Hartsell,CMA  

## 2018-02-09 ENCOUNTER — Ambulatory Visit: Payer: Medicare Other | Attending: Neurology | Admitting: Physical Therapy

## 2018-02-09 DIAGNOSIS — M6281 Muscle weakness (generalized): Secondary | ICD-10-CM | POA: Diagnosis present

## 2018-02-09 DIAGNOSIS — R2689 Other abnormalities of gait and mobility: Secondary | ICD-10-CM | POA: Diagnosis present

## 2018-02-10 ENCOUNTER — Encounter: Payer: Self-pay | Admitting: Physical Therapy

## 2018-02-10 ENCOUNTER — Telehealth: Payer: Self-pay

## 2018-02-10 ENCOUNTER — Other Ambulatory Visit: Payer: Self-pay

## 2018-02-10 NOTE — Telephone Encounter (Signed)
Betsy- PT with [facility] calling for verbal orders to see patient 1x wk for 2 week 2x wk for 2 weeks Her call back .[phone]

## 2018-02-10 NOTE — Telephone Encounter (Signed)
Will- OT with [facility] calling for verbal orders to see pt  1x for 1 week 2x for 2 weeks Call back for VO [phone] Wallace Cullens, RN

## 2018-02-10 NOTE — Therapy (Signed)
[facility] [address] [place], [place], [address] Phone: [phone]   Fax:  [phone]  Physical Therapy Evaluation  Patient Details  Name: Claudia Walter MRN: [id number] Date of Birth: Aug 06, 1946 Referring Provider: Dr. Marcial Pacas   Encounter Date: 02/09/2018  PT End of Session - 02/10/18 2242    Visit Number  1    PT Start Time  0930    PT Stop Time  1050    PT Time Calculation (min)  80 min       Past Medical History:  Diagnosis Date  . Depression   . Diabetes mellitus without complication ([place])   . Hyperlipemia   . MS (multiple sclerosis) ([place])   . Osteoporosis     Past Surgical History:  Procedure Laterality Date  . CESAREAN SECTION N/A    X 2  . HIP FRACTURE SURGERY Right     There were no vitals filed for this visit.   Subjective Assessment - 02/10/18 2241    Currently in Pain?  No/denies         [facility] PT Assessment - 02/10/18 0001      Assessment   Medical Diagnosis  Multiple Sclerosis    Referring Provider  Dr. Marcial Pacas    Onset Date/Surgical Date  -- 1994      Precautions   Precautions  Fall      Prior Function   Level of Independence  Needs assistance with ADLs;Needs assistance with transfers;Needs assistance with homemaking                Objective measurements completed on examination: See above findings.            Pt seen for power wheelchair eval with Deberah Pelton, ATP with [employer]; recommend group 3 power wheelchair with power tilt and recline              Plan - 02/10/18 2243    Clinical Impression Statement  Pt is a 72 yr old lady with MS who presents for power wheelchair evaluation.  Pt evaluated for Group 3 power wheelchair - Erlene Quan Sisk, ATP with [employer] present for eval; recommend power wheelchair with power tilt and recline    Clinical Presentation  Evolving    Clinical Decision Making  Moderate    Rehab Potential  Good    PT  Frequency  One time visit    PT Treatment/Interventions  -- wheelchair management    Recommended Other Services  obtain power wheelchair from [employer]     Consulted and Agree with Plan of Care  Patient       Patient will benefit from skilled therapeutic intervention in order to improve the following deficits and impairments:  Hypomobility  Visit Diagnosis: Muscle weakness (generalized) - Plan: PT plan of care cert/re-cert  Other abnormalities of gait and mobility - Plan: PT plan of care cert/re-cert     Problem List Patient Active Problem List   Diagnosis Date Noted  . Hiatal hernia 12/15/2017  . Anemia 12/15/2017  . Multiple sclerosis ([place]) 12/14/2017  . Neurogenic bladder 12/14/2017  . Suprapubic catheter ([place]) 12/14/2017  . Osteoporosis 12/14/2017  . Type 2 diabetes mellitus ([place]) 12/14/2017  . Hyperlipidemia 12/14/2017    Alda Lea, PT 02/10/2018, 10:50 PM  [facility] [address] [place], [place], [address] Phone: [phone]   Fax:  [phone]  Name: Claudia Walter MRN: [id number] Date of Birth: 02-02-1946

## 2018-02-11 NOTE — Telephone Encounter (Signed)
Verbal orders given to Will for OT and Betsy for PT.  Lucila Maine, DO PGY-2, [facility] 02/11/2018 8:41 AM

## 2018-02-17 ENCOUNTER — Encounter: Payer: Self-pay | Admitting: Family Medicine

## 2018-02-25 ENCOUNTER — Telehealth: Payer: Self-pay | Admitting: Neurology

## 2018-02-25 DIAGNOSIS — G35 Multiple sclerosis: Secondary | ICD-10-CM

## 2018-02-25 DIAGNOSIS — E118 Type 2 diabetes mellitus with unspecified complications: Secondary | ICD-10-CM

## 2018-02-25 NOTE — Telephone Encounter (Signed)
Spoke to patient - she requested this prescription be mailed to her home address.  Rx written, signed by MD and mailed today.

## 2018-02-25 NOTE — Telephone Encounter (Signed)
Pt has called stating that for 4 years now she has been on an air mattress prescribed by another provider.  Pt states now that this air mattress has low pressure and not holding air very long.  Pt would like to know if Dr Krista Blue could write another prescription for her to obtain another air mattress through a medical supply company.  Please call

## 2018-02-25 NOTE — Addendum Note (Signed)
Addended by: Desmond Lope on: 02/25/2018 09:47 AM   Modules accepted: Orders

## 2018-03-07 ENCOUNTER — Inpatient Hospital Stay: Admission: RE | Admit: 2018-03-07 | Payer: BLUE CROSS/BLUE SHIELD | Source: Ambulatory Visit

## 2018-03-15 ENCOUNTER — Telehealth: Payer: Self-pay | Admitting: Neurology

## 2018-03-15 NOTE — Telephone Encounter (Signed)
Paperwork was faxed and confirmed receipt last week but was dated 03/08/18.  I have re-faxed it again and left Jerene Pitch a message letting her know.  I also provided our fax number, in case she needs Dr. Rhea Belton signature on any additional orders for this patient.

## 2018-03-15 NOTE — Telephone Encounter (Signed)
Brooke @ [employer] states on 05-16 she faxed over the paperwork that needs signing and dating re: the final Prescription for pt's power wheel chair.  Jerene Pitch is asking to be called at [phone] xt [phone]

## 2018-03-23 ENCOUNTER — Telehealth: Payer: Self-pay | Admitting: Neurology

## 2018-03-23 MED ORDER — BACLOFEN 20 MG PO TABS: 20.0000 mg | ORAL_TABLET | Freq: Two times a day (BID) | ORAL | 11 refills | Status: DC

## 2018-03-23 NOTE — Telephone Encounter (Signed)
Spoke to patient - she confirmed the dosage of her baclofen 20mg , one tablet BID.  Says Dr. Krista Blue was going to continue her refills - rx sent to the pharmacy.

## 2018-03-23 NOTE — Telephone Encounter (Signed)
Patient requesting a new Rx for Baclofen sent to [facility] [address].

## 2018-03-23 NOTE — Addendum Note (Signed)
Addended by: Noberto Retort C on: 03/23/2018 01:40 PM   Modules accepted: Orders

## 2018-03-24 ENCOUNTER — Telehealth: Payer: Self-pay

## 2018-03-24 NOTE — Telephone Encounter (Signed)
Faxed orders received from [facility] for Skilled Nurse for PRN visit. Orders placed in PCP's box for signature. Please return to nurse clinic when complete.  Danley Danker, RN [facility] RN)

## 2018-03-25 NOTE — Telephone Encounter (Signed)
Order # [id number] for SN visit faxed back to [facility] at [phone]. Placed in scan queue.  Danley Danker, RN [facility] RN)

## 2018-03-25 NOTE — Telephone Encounter (Signed)
Signed, placed in RN box.

## 2018-03-29 ENCOUNTER — Telehealth: Payer: Self-pay

## 2018-03-29 NOTE — Telephone Encounter (Signed)
Faxed order # [id number] received from [facility] for Social Work Evaluation results. Orders placed in PCP's box for signature. Please return to nurse clinic when complete.  Danley Danker, RN [facility] RN)

## 2018-03-30 NOTE — Telephone Encounter (Signed)
Completed and return to RN

## 2018-03-30 NOTE — Telephone Encounter (Signed)
Order # [id number] faxed to [facility] at [phone] and placed in batch scanning. Danley Danker, RN [facility] RN)

## 2018-04-08 ENCOUNTER — Encounter (HOSPITAL_COMMUNITY): Payer: Self-pay | Admitting: *Deleted

## 2018-04-12 ENCOUNTER — Ambulatory Visit (HOSPITAL_COMMUNITY)
Admission: RE | Admit: 2018-04-12 | Discharge: 2018-04-12 | Disposition: A | Discharge status: Home / Self Care | Payer: Medicare Other | Source: Ambulatory Visit | Attending: Gastroenterology | Admitting: Gastroenterology

## 2018-04-12 ENCOUNTER — Ambulatory Visit (HOSPITAL_COMMUNITY): Payer: Medicare Other | Admitting: Certified Registered Nurse Anesthetist

## 2018-04-12 ENCOUNTER — Other Ambulatory Visit: Payer: Self-pay

## 2018-04-12 ENCOUNTER — Encounter (HOSPITAL_COMMUNITY)
Admission: RE | Disposition: A | Discharge status: Home / Self Care | Payer: Self-pay | Source: Ambulatory Visit | Attending: Gastroenterology

## 2018-04-12 ENCOUNTER — Encounter (HOSPITAL_COMMUNITY): Payer: Self-pay

## 2018-04-12 DIAGNOSIS — E785 Hyperlipidemia, unspecified: Secondary | ICD-10-CM | POA: Insufficient documentation

## 2018-04-12 DIAGNOSIS — K210 Gastro-esophageal reflux disease with esophagitis: Secondary | ICD-10-CM | POA: Insufficient documentation

## 2018-04-12 DIAGNOSIS — E119 Type 2 diabetes mellitus without complications: Secondary | ICD-10-CM | POA: Insufficient documentation

## 2018-04-12 DIAGNOSIS — K449 Diaphragmatic hernia without obstruction or gangrene: Secondary | ICD-10-CM | POA: Diagnosis not present

## 2018-04-12 DIAGNOSIS — K259 Gastric ulcer, unspecified as acute or chronic, without hemorrhage or perforation: Secondary | ICD-10-CM | POA: Diagnosis not present

## 2018-04-12 DIAGNOSIS — Z993 Dependence on wheelchair: Secondary | ICD-10-CM | POA: Insufficient documentation

## 2018-04-12 DIAGNOSIS — K317 Polyp of stomach and duodenum: Secondary | ICD-10-CM | POA: Diagnosis not present

## 2018-04-12 DIAGNOSIS — K319 Disease of stomach and duodenum, unspecified: Secondary | ICD-10-CM | POA: Diagnosis not present

## 2018-04-12 DIAGNOSIS — Z87891 Personal history of nicotine dependence: Secondary | ICD-10-CM | POA: Insufficient documentation

## 2018-04-12 DIAGNOSIS — K219 Gastro-esophageal reflux disease without esophagitis: Secondary | ICD-10-CM

## 2018-04-12 DIAGNOSIS — Z79899 Other long term (current) drug therapy: Secondary | ICD-10-CM | POA: Insufficient documentation

## 2018-04-12 DIAGNOSIS — G35 Multiple sclerosis: Secondary | ICD-10-CM | POA: Insufficient documentation

## 2018-04-12 HISTORY — PX: ESOPHAGOGASTRODUODENOSCOPY (EGD) WITH PROPOFOL: SHX5813

## 2018-04-12 SURGERY — ESOPHAGOGASTRODUODENOSCOPY (EGD) WITH PROPOFOL
Anesthesia: Monitor Anesthesia Care

## 2018-04-12 MED ORDER — PROPOFOL 10 MG/ML IV BOLUS
INTRAVENOUS | Status: AC
  Filled 2018-04-12: qty 20

## 2018-04-12 MED ORDER — PROPOFOL 500 MG/50ML IV EMUL
INTRAVENOUS | Status: DC | PRN
  Administered 2018-04-12: 120 ug/kg/min via INTRAVENOUS

## 2018-04-12 MED ORDER — PROPOFOL 10 MG/ML IV BOLUS
INTRAVENOUS | Status: AC
  Filled 2018-04-12: qty 40

## 2018-04-12 MED ORDER — PROPOFOL 10 MG/ML IV BOLUS
INTRAVENOUS | Status: DC | PRN
  Administered 2018-04-12: 20 mg via INTRAVENOUS
  Administered 2018-04-12: 10 mg via INTRAVENOUS

## 2018-04-12 MED ORDER — LACTATED RINGERS IV SOLN
INTRAVENOUS | Status: DC
Start: 2018-04-12 — End: 2018-04-12
  Administered 2018-04-12: 11:00:00 via INTRAVENOUS

## 2018-04-12 MED ORDER — LIDOCAINE 2% (20 MG/ML) 5 ML SYRINGE
INTRAMUSCULAR | Status: DC | PRN
  Administered 2018-04-12: 100 mg via INTRAVENOUS

## 2018-04-12 SURGICAL SUPPLY — 14 items

## 2018-04-12 NOTE — Discharge Instructions (Signed)
YOU HAD AN ENDOSCOPIC PROCEDURE TODAY: Refer to the procedure report and other information in the discharge instructions given to you for any specific questions about what was found during the examination. If this information does not answer your questions, please call [place] office at [phone] to clarify.  ° °YOU SHOULD EXPECT: Some feelings of bloating in the abdomen. Passage of more gas than usual. Walking can help get rid of the air that was put into your GI tract during the procedure and reduce the bloating. If you had a lower endoscopy (such as a colonoscopy or flexible sigmoidoscopy) you may notice spotting of blood in your stool or on the toilet paper. Some abdominal soreness may be present for a day or two, also. ° °DIET: Your first meal following the procedure should be a light meal and then it is ok to progress to your normal diet. A half-sandwich or bowl of soup is an example of a good first meal. Heavy or fried foods are harder to digest and may make you feel nauseous or bloated. Drink plenty of fluids but you should avoid alcoholic beverages for 24 hours. If you had a esophageal dilation, please see attached instructions for diet.   ° °ACTIVITY: Your care partner should take you home directly after the procedure. You should plan to take it easy, moving slowly for the rest of the day. You can resume normal activity the day after the procedure however YOU SHOULD NOT DRIVE, use power tools, machinery or perform tasks that involve climbing or major physical exertion for 24 hours (because of the sedation medicines used during the test).  ° °SYMPTOMS TO REPORT IMMEDIATELY: °A gastroenterologist can be reached at any hour. Please call [phone]  for any of the following symptoms:  °Following lower endoscopy (colonoscopy, flexible sigmoidoscopy) °Excessive amounts of blood in the stool  °Significant tenderness, worsening of abdominal pains  °Swelling of the abdomen that is new, acute  °Fever of 100° or  higher  °Following upper endoscopy (EGD, EUS, ERCP, esophageal dilation) °Vomiting of blood or coffee ground material  °New, significant abdominal pain  °New, significant chest pain or pain under the shoulder blades  °Painful or persistently difficult swallowing  °New shortness of breath  °Black, tarry-looking or red, bloody stools ° °FOLLOW UP:  °If any biopsies were taken you will be contacted by phone or by letter within the next 1-3 weeks. Call [phone]  if you have not heard about the biopsies in 3 weeks.  °Please also call with any specific questions about appointments or follow up tests. ° °

## 2018-04-12 NOTE — Op Note (Signed)
[facility] Patient Name: Claudia Walter Procedure Date: 04/12/2018 MRN: [id number] Attending MD: Carlota Raspberry. Havery Moros , MD Date of Birth: 01-05-1946 CSN: [id number] Age: 72 Admit Type: Outpatient Procedure:                Upper GI endoscopy Indications:              history of reflux esophagitis, now on omeprazole                            with better control of symptoms. EGD to                            re-evaluate, ensure no evidence of Barrett's                            esophagus Providers:                Carlota Raspberry. Havery Moros, MD, Burtis Junes, RN, Charolette Child, Technician, Dione Booze, CRNA Referring MD:              Medicines:                Monitored Anesthesia Care Complications:            No immediate complications. Estimated blood loss:                            Minimal. Estimated Blood Loss:     Estimated blood loss was minimal. Procedure:                Pre-Anesthesia Assessment:                           - Prior to the procedure, a History and Physical                            was performed, and patient medications and                            allergies were reviewed. The patient's tolerance of                            previous anesthesia was also reviewed. The risks                            and benefits of the procedure and the sedation                            options and risks were discussed with the patient.                            All questions were answered, and informed consent                            was  obtained. Prior Anticoagulants: The patient has                            taken no previous anticoagulant or antiplatelet                            agents. ASA Grade Assessment: III - A patient with                            severe systemic disease. After reviewing the risks                            and benefits, the patient was deemed in                            satisfactory condition to  undergo the procedure.                           After obtaining informed consent, the endoscope was                            passed under direct vision. Throughout the                            procedure, the patient's blood pressure, pulse, and                            oxygen saturations were monitored continuously. The                            Endoscope was introduced through the mouth, and                            advanced to the second part of duodenum. The upper                            GI endoscopy was accomplished without difficulty.                            The patient tolerated the procedure well. Scope In: Scope Out: Findings:      Esophagogastric landmarks were identified: the Z-line was found at 32       cm, the gastroesophageal junction was found at 32 cm and the upper       extent of the gastric folds was found at 35 cm from the incisors.      A 3 cm hiatal hernia was present.      The exam of the esophagus was otherwise normal. No evidence of       esophagitis or Barrett's esophagus      A few small sessile benign appearing polyps were found in the gastric       body. Biopsies were taken with a cold forceps for histology.      One non-bleeding superficial gastric ulcer with no stigmata of bleeding       was found in the gastric  antrum, roughly a few mm in size, appeared to       be healing.      The exam of the stomach was otherwise normal.      Biopsies were taken with a cold forceps in the gastric body, at the       incisura and in the gastric antrum for Helicobacter pylori testing.      The duodenal bulb and second portion of the duodenum were normal.       Biopsies were taken with a cold forceps for histology. Impression:               - Esophagogastric landmarks identified.                           - 3 cm hiatal hernia.                           - Normal esophagus otherwise - no Barrett's or                            esophagitis                            - A few benign appearing gastric polyps. Biopsied.                           - Non-bleeding small gastric ulcer with no stigmata                            of bleeding.                           - Normal duodenal bulb and second portion of the                            duodenum. Biopsied.                           - Biopsies were taken with a cold forceps for                            Helicobacter pylori testing. Moderate Sedation:      No moderate sedation, case performed with MAC Recommendation:           - Patient has a contact number available for                            emergencies. The signs and symptoms of potential                            delayed complications were discussed with the                            patient. Return to normal activities tomorrow.                            Written discharge instructions were provided to  the                            patient.                           - Resume previous diet.                           - Continue present medications.                           - Continue omeprazole 19m twice daily for another                            month, then decrease to once daily as tolerated.                            Long term recommend lowest dose needed to control                            symptoms.                           - Await pathology results.                           - If Linzess 1466m was too strong for your                            treatment of constipation, can consider low dose at                            7269mif needed. Otherwise continue Miralax if that                            is working                           - We have not yet received records of prior                            colonoscopy report, we will again attempt to obtain                            those to clarify findings on your last exam Procedure Code(s):        --- Professional ---                           [id number]py, flexible,                            transoral; with biopsy, single or multiple Diagnosis Code(s):        --- Professional ---  K44.9, Diaphragmatic hernia without obstruction or                            gangrene                           K31.7, Polyp of stomach and duodenum                           K25.9, Gastric ulcer, unspecified as acute or                            chronic, without hemorrhage or perforation                           K21.0, Gastro-esophageal reflux disease with                            esophagitis CPT copyright 2017 American Medical Association. All rights reserved. The codes documented in this report are preliminary and upon coder review may  be revised to meet current compliance requirements. Remo Lipps P. Armbruster, MD 04/12/2018 11:46:20 AM This report has been signed electronically. Number of Addenda: 0

## 2018-04-12 NOTE — Interval H&P Note (Signed)
History and Physical Interval Note:  04/12/2018 11:14 AM  Claudia Walter  has presented today for surgery, with the diagnosis of gerd/jmh  The various methods of treatment have been discussed with the patient and family. After consideration of risks, benefits and other options for treatment, the patient has consented to  Procedure(s): ESOPHAGOGASTRODUODENOSCOPY (EGD) WITH PROPOFOL (N/A) as a surgical intervention .  The patient's history has been reviewed, patient examined, no change in status, stable for surgery.  I have reviewed the patient's chart and labs.  Questions were answered to the patient's satisfaction.     [place]

## 2018-04-12 NOTE — Anesthesia Procedure Notes (Signed)
Procedure Name: MAC Date/Time: 04/12/2018 11:25 AM Performed by: Dione Booze, CRNA Pre-anesthesia Checklist: Patient identified, Emergency Drugs available, Suction available and Patient being monitored Patient Re-evaluated:Patient Re-evaluated prior to induction Oxygen Delivery Method: Nasal cannula Placement Confirmation: positive ETCO2

## 2018-04-12 NOTE — H&P (Signed)
HPI:   Claudia Walter is a 72 y.o. female with a history of MS, wheelchair bound, with history of GERD, history of esophagitis, here for EGD in light of recent worsening of reflux and screening for Barrett's esophagitis. Now on higher dose of omeprazole with improvement in symptoms.   Past Medical History:  Diagnosis Date  . Depression   . Diabetes mellitus without complication ([place])   . Hyperlipemia   . MS (multiple sclerosis) ([place])   . Osteoporosis     Past Surgical History:  Procedure Laterality Date  . CESAREAN SECTION N/A    X 2  . HIP FRACTURE SURGERY Right     Family History  Problem Relation Age of Onset  . Diabetes Mother   . Heart attack Mother   . Colon cancer Father      Social History   Tobacco Use  . Smoking status: Former Smoker    Types: Cigarettes    Last attempt to quit: 1978    Years since quitting: 41.4  . Smokeless tobacco: Never Used  Substance Use Topics  . Alcohol use: Yes    Comment: occasional glass of wine  . Drug use: Never    Prior to Admission medications   Medication Sig Start Date End Date Taking? Authorizing Provider  Ascorbic Acid (VITAMIN C) 1000 MG tablet Take 1,000 mg by mouth every other day.   Yes [provider]  baclofen (LIORESAL) 20 MG tablet Take 1 tablet (20 mg total) by mouth 2 (two) times daily. 03/23/18  Yes Marcial Pacas, MD  buPROPion (WELLBUTRIN) 75 MG tablet Take 1 tablet (75 mg total) by mouth daily. 01/18/18  Yes Marcial Pacas, MD  Cyanocobalamin (VITAMIN B-12 PO) Place 1 mL under the tongue daily.   Yes [provider]  gabapentin (NEURONTIN) 300 MG capsule Take 1 capsule (300 mg total) by mouth 2 (two) times daily. 01/18/18  Yes Marcial Pacas, MD  hydrochlorothiazide (HYDRODIURIL) 25 MG tablet Take 25 mg by mouth 3 (three) times a week.    Yes [provider]  ibuprofen (ADVIL,MOTRIN) 600 MG tablet Take 600 mg by mouth daily.    Yes [provider]  metFORMIN (GLUCOPHAGE)  500 MG tablet Take 500 mg by mouth daily with breakfast.    Yes [provider]  omeprazole (PRILOSEC) 20 MG capsule Take 1-2 capsules (20-40 mg total) by mouth daily. Patient taking differently: Take 20 mg by mouth daily.  01/31/18  Yes Lanetta Figuero, Carlota Raspberry, MD  oxybutynin (DITROPAN) 5 MG tablet Take 5 mg by mouth daily.    Yes [provider]  polyethylene glycol (MIRALAX / GLYCOLAX) packet Take 17 g by mouth daily as needed for moderate constipation.   Yes [provider]  pravastatin (PRAVACHOL) 20 MG tablet Take 1 tablet (20 mg total) by mouth daily. 01/14/18  Yes Diallo, Abdoulaye, MD  denosumab (PROLIA) 60 MG/ML SOLN injection Inject 60 mg into the skin every 6 (six) months. Administer in upper arm, thigh, or abdomen Patient not taking: Reported on 04/08/2018 02/02/18   [name], DO  linaclotide [facility]) 145 MCG CAPS capsule Take 1 capsule (145 mcg total) by mouth daily before breakfast. Patient not taking: Reported on 04/08/2018 01/31/18   Martin Smeal, Carlota Raspberry, MD    Current Facility-Administered Medications  Medication Dose Route Frequency Provider Last Rate Last Dose  . lactated ringers infusion   Intravenous Continuous Kazi Reppond, Carlota Raspberry, MD 125  mL/hr at 04/12/18 1044      Allergies as of 01/31/2018 - Review Complete 01/31/2018  Allergen Reaction Noted  . Sulfa antibiotics Nausea And Vomiting 10/23/2017     Review of Systems:    As per HPI, otherwise negative    Physical Exam:  Vital signs in last 24 hours: Temp:  [98.1 F (36.7 C)] 98.1 F (36.7 C) (06/18 1033) Pulse Rate:  [85] 85 (06/18 1033) Resp:  [14] 14 (06/18 1033) BP: (152)/(66) 152/66 (06/18 1033) SpO2:  [100 %] 100 % (06/18 1033)   General:   Pleasant female in NAD Lungs:  Respirations even and unlabored. Lungs clear to auscultation bilaterally.    Heart:  Regular rate and rhythm; no MRG Abdomen:  Soft, nondistended, nontender.  No appreciable masses or hepatomegaly.   Lab  Results  Component Value Date   WBC 8.3 12/14/2017   HGB 11.7 12/14/2017   HCT 35.7 12/14/2017   MCV 83 12/14/2017   PLT 437 (H) 12/14/2017    Lab Results  Component Value Date   CREATININE 0.78 12/14/2017   BUN 19 12/14/2017   NA 141 12/14/2017   K 4.5 12/14/2017   CL 99 12/14/2017   CO2 23 12/14/2017    No results found for: ALT, AST, GGT, ALKPHOS, BILITOT    Impression / Plan:  72 y/o female with MS, wheelchair bound, history of GERD / esophagitis, worsening reflux recently here for EGD to further evaluate and to screen for BE. I have discussed risks / benefits of EGD and anesthesia with her, she wishes to proceed.   Candor Cellar, MD [facility]

## 2018-04-12 NOTE — Transfer of Care (Signed)
Immediate Anesthesia Transfer of Care Note  Patient: Claudia Walter  Procedure(s) Performed: ESOPHAGOGASTRODUODENOSCOPY (EGD) WITH PROPOFOL (N/A )  Patient Location: Endoscopy Unit  Anesthesia Type:MAC  Level of Consciousness: awake, alert , oriented and patient cooperative  Airway & Oxygen Therapy: Patient Spontanous Breathing and Patient connected to face mask oxygen  Post-op Assessment: Report given to RN, Post -op Vital signs reviewed and stable and Patient moving all extremities  Post vital signs: Reviewed and stable  Last Vitals:  Vitals Value Taken Time  BP 122/66 04/12/2018 11:46 AM  Temp    Pulse 96 04/12/2018 11:46 AM  Resp 19 04/12/2018 11:46 AM  SpO2 100 % 04/12/2018 11:46 AM  Vitals shown include unvalidated device data.  Last Pain:  Vitals:   04/12/18 1033  TempSrc: Oral  PainSc: 0-No pain         Complications: No apparent anesthesia complications

## 2018-04-12 NOTE — Anesthesia Preprocedure Evaluation (Addendum)
Anesthesia Evaluation  Patient identified by MRN, date of birth, ID band Patient awake    Reviewed: Allergy & Precautions, NPO status , Patient's Chart, lab work & pertinent test results  Airway Mallampati: II  TM Distance: >3 FB Neck ROM: Full    Dental  (+) Edentulous Upper, Edentulous Lower   Pulmonary former smoker,    breath sounds clear to auscultation       Cardiovascular  Rhythm:Regular Rate:Normal     Neuro/Psych    GI/Hepatic   Endo/Other  diabetes  Renal/GU      Musculoskeletal   Abdominal   Peds  Hematology   Anesthesia Other Findings   Reproductive/Obstetrics                            Anesthesia Physical Anesthesia Plan  ASA: III  Anesthesia Plan: MAC   Post-op Pain Management:    Induction: Intravenous  PONV Risk Score and Plan: Ondansetron and Propofol infusion  Airway Management Planned: Nasal Cannula and Natural Airway  Additional Equipment:   Intra-op Plan:   Post-operative Plan:   Informed Consent: I have reviewed the patients History and Physical, chart, labs and discussed the procedure including the risks, benefits and alternatives for the proposed anesthesia with the patient or authorized representative who has indicated his/her understanding and acceptance.     Plan Discussed with: CRNA and Anesthesiologist  Anesthesia Plan Comments:        Anesthesia Quick Evaluation

## 2018-04-12 NOTE — Anesthesia Postprocedure Evaluation (Signed)
Anesthesia Post Note  Patient: Claudia Walter  Procedure(s) Performed: ESOPHAGOGASTRODUODENOSCOPY (EGD) WITH PROPOFOL (N/A )     Patient location during evaluation: PACU Anesthesia Type: MAC Level of consciousness: awake and alert Pain management: pain level controlled Vital Signs Assessment: post-procedure vital signs reviewed and stable Respiratory status: spontaneous breathing, nonlabored ventilation, respiratory function stable and patient connected to nasal cannula oxygen Cardiovascular status: stable and blood pressure returned to baseline Postop Assessment: no apparent nausea or vomiting Anesthetic complications: no    Last Vitals:  Vitals:   04/12/18 1200 04/12/18 1213  BP: 119/82 138/69  Pulse: 80 85  Resp: 15 16  Temp:    SpO2: 98% 92%    Last Pain:  Vitals:   04/12/18 1213  TempSrc:   PainSc: 0-No pain                 Teale Goodgame COKER

## 2018-04-13 ENCOUNTER — Encounter (HOSPITAL_COMMUNITY): Payer: Self-pay | Admitting: Gastroenterology

## 2018-04-13 ENCOUNTER — Telehealth: Payer: Self-pay

## 2018-04-13 ENCOUNTER — Encounter: Payer: Self-pay | Admitting: Gastroenterology

## 2018-04-13 NOTE — Telephone Encounter (Signed)
They stated they have no records for this patient.  Dr. Temple Pacini office, [place] in [place] did not respond to request.  LM for Dr. Temple Pacini' office to call back.

## 2018-04-13 NOTE — Telephone Encounter (Signed)
-----   Message from Roetta Sessions, [place] sent at 04/12/2018  1:48 PM EDT ----- Regarding: FW: colonoscopy report Records re requested 04-12-18 from [facility] in [place].  STAT    ----- Message ----- From: Yetta Flock, MD Sent: 04/12/2018  11:46 AM To: Roetta Sessions, CMA Subject: colonoscopy report                             Hi Jan, We attempted to obtain prior colonoscopy report for this patient. I never saw anything come in. Did you? If not, we can put out the request again if we still have the information? Thanks

## 2018-04-15 NOTE — Telephone Encounter (Signed)
To clarify, [facility] said they have no record of patient.  Left another message at Dr. Temple Pacini office for someone to call me back.

## 2018-04-18 NOTE — Telephone Encounter (Signed)
  Received endo and colon records from [facility].  Put on Dr. Doyne Keel desk for review.

## 2018-04-19 ENCOUNTER — Inpatient Hospital Stay: Admission: RE | Admit: 2018-04-19 | Payer: BLUE CROSS/BLUE SHIELD | Source: Ambulatory Visit

## 2018-04-19 ENCOUNTER — Telehealth: Payer: Self-pay | Admitting: Neurology

## 2018-04-19 NOTE — Telephone Encounter (Signed)
Spoke to patient - she is having left leg pain, starting in her foot and traveling up the front of her leg and around the knee.  Says this happened years ago but she cannot recall the cause but was provided hydrocodone and the pain resolved.  She denies swelling or redness to the area.  The pain has been occurring intermittently over the last 3-4 weeks.  She was instructed to contact her PCP so that the pain could be properly evaluated and the appropriate treatment initiated.  She will call her PCP this afternoon.  Separately, she is concerned about bilateral leg stiffness.  Her follow up with Dr. Krista Blue has been moved to 04/27/18.

## 2018-04-19 NOTE — Telephone Encounter (Signed)
Patient having intermittent pain in left leg x 2 weeks at night. Her appointment with Dr. Krista Blue is 05-23-18. Please call and discuss.

## 2018-04-27 ENCOUNTER — Encounter: Payer: Self-pay | Admitting: Neurology

## 2018-04-27 ENCOUNTER — Ambulatory Visit (INDEPENDENT_AMBULATORY_CARE_PROVIDER_SITE_OTHER): Payer: Medicare Other | Admitting: Neurology

## 2018-04-27 VITALS — BP 146/69 | HR 99

## 2018-04-27 DIAGNOSIS — G35 Multiple sclerosis: Secondary | ICD-10-CM | POA: Diagnosis not present

## 2018-04-27 DIAGNOSIS — M79605 Pain in left leg: Secondary | ICD-10-CM

## 2018-04-27 MED ORDER — TRAMADOL HCL 50 MG PO TABS: 50.0000 mg | ORAL_TABLET | Freq: Four times a day (QID) | ORAL | 4 refills | Status: DC | PRN

## 2018-04-27 NOTE — Patient Instructions (Signed)
[facility]   Address: [address], [place], [place] [address]   Phone: [phone] 

## 2018-04-27 NOTE — Progress Notes (Signed)
PATIENT: Claudia Walter DOB: [birth year]  Chief Complaint  Patient presents with  . Follow-up    Rm 2, alone: Pt's legs are causing her pain. Fees like "bone pain" when she lays down.  Marland Kitchen PCP    Dr. Lucila Maine     HISTORICAL  Claudia Walter is a 72 year old female, seen in refer by primary care physician [name], for evaluation of relapsing remitting multiple sclerosis, initial evaluation was on January 18, 2018,  She was brought in by transportation, in electronic wheelchair, alone at today's clinical visit,  I was able to review old her previous neurological evaluation from [facility] by Dr.   [name], most recent note was on May 14, 2017,  She is a retired [employer] account executive, was diagnosed with multiple sclerosis since 1993, she presented with thoracic myelopathy, initial symptoms involved pinching around her waist felt like tightening sensation, paresthesia involving all limbs, lumbar puncture was positive, Evoked potential was positive,  She had slowly progressive worsening gait abnormality, remembered in 2000, after her mother passed away, she began to ambulate with a cane, in 2005, after her husband passed away, she began to use her walker, has to quit her job due to worsening gait difficulty, was given a course of IV steroid, did improve her strength temporarily, Betaseron since 2004, she moved to [place] in 2007 due to family stress, was followed by local neurologist [name] at, , [facility], noticed continue progressive gait abnormality, eventually begin to use wheelchair in 2009, but still able to take a few steps with his walker, switched to Copaxone in 2013,   In 2014 she injured her right hip, later also suffered right tibial and fibular fracture, was treated with a brace for 8 weeks, was never able to walk again, she also stopped long-term immunomodification therapy since 2014, had a short trial of Ampyra without helping her  gait.   Per record, last MRI was in 2018, showed stable hemisphere lesions,  Laboratory evaluation per record showed: vitamin D was 26, she is JC virus positive with index of 0.41, Tegretol level was 9.4,  She is now living with her son, and his girlfriend and children, she is tearful during today's interview, she has been in her current wheelchair for 10 years, it has become malfunction, she has more difficulty positioning herself in the wheelchair, also complains of worsening bilateral hip pain due to difficulty positioning, developed decubitus ulcer.  She had suprapubic catheter for 10 years, was recently evaluated by urologist [name] at [facility] urologist, ultrasound of kidney was reported normal  MRI of the brain without contrast in November 2018 from [place] reported mild cerebral atrophy, with proportional ventricular dilatation, scattered supratentorium periventricular, and subcortical white matter hyper intensity, was reported as stable,  Laboratory evaluation in June 2018, normal CBC hemoglobin of 12.8, CMP creatinine of 0.76, normal TSH 3.78, vitamin D of 41, mild elevated alkaline phosphate 158, B12 is 564,  UPDATE April 27 2018: She continue with electronic wheelchair today, was dropped in by transportation, she reported intermittent episode of sharp pain starting at left knee, radiating to left shin, lasting for hours, ibuprofen only provide limited help  She also reported that this morning, she was very tired, difficulty to be awakened, similar to the symptoms she had previously for her UTI,   REVIEW OF SYSTEMS: Full 14 system review of systems performed and notable only for joint pain, achy muscles, constipation depression  ALLERGIES:  Allergies  Allergen Reactions  . Sulfa Antibiotics Nausea And Vomiting    HOME MEDICATIONS: Current Outpatient Medications  Medication Sig Dispense Refill  . Ascorbic Acid (VITAMIN C) 1000 MG tablet Take 1,000 mg by mouth every  other day.    . baclofen (LIORESAL) 20 MG tablet Take 1 tablet (20 mg total) by mouth 2 (two) times daily. 60 each 11  . buPROPion (WELLBUTRIN) 75 MG tablet Take 1 tablet (75 mg total) by mouth daily. 90 tablet 4  . Cyanocobalamin (VITAMIN B-12 PO) Place 1 mL under the tongue daily.    Marland Kitchen gabapentin (NEURONTIN) 300 MG capsule Take 1 capsule (300 mg total) by mouth 2 (two) times daily. 180 capsule 4  . hydrochlorothiazide (HYDRODIURIL) 25 MG tablet Take 25 mg by mouth 3 (three) times a week.     . metFORMIN (GLUCOPHAGE) 500 MG tablet Take 500 mg by mouth daily with breakfast.     . omeprazole (PRILOSEC) 20 MG capsule Take 1-2 capsules (20-40 mg total) by mouth daily. (Patient taking differently: Take 20 mg by mouth daily. ) 90 capsule 1  . oxybutynin (DITROPAN) 5 MG tablet Take 5 mg by mouth daily.     . polyethylene glycol (MIRALAX / GLYCOLAX) packet Take 17 g by mouth daily as needed for moderate constipation.    . pravastatin (PRAVACHOL) 20 MG tablet Take 1 tablet (20 mg total) by mouth daily. 90 tablet 3   No current facility-administered medications for this visit.     PAST MEDICAL HISTORY: Past Medical History:  Diagnosis Date  . Depression   . Diabetes mellitus without complication ([place])   . Hyperlipemia   . MS (multiple sclerosis) ([place])   . Osteoporosis     PAST SURGICAL HISTORY: Past Surgical History:  Procedure Laterality Date  . CESAREAN SECTION N/A    X 2  . ESOPHAGOGASTRODUODENOSCOPY (EGD) WITH PROPOFOL N/A 04/12/2018   Procedure: ESOPHAGOGASTRODUODENOSCOPY (EGD) WITH PROPOFOL;  Surgeon: Yetta Flock, MD;  Location: [facility];  Service: Gastroenterology;  Laterality: N/A;  . HIP FRACTURE SURGERY Right     FAMILY HISTORY: Family History  Problem Relation Age of Onset  . Diabetes Mother   . Heart attack Mother   . Colon cancer Father     SOCIAL HISTORY:  Social History   Socioeconomic History  . Marital status: Widowed    Spouse name: Not on file    . Number of children: 2  . Years of education: college  . Highest education level: Bachelor's degree (e.g., BA, AB, BS)  Occupational History  . Occupation: Disabled  Social Needs  . Financial resource strain: Not on file  . Food insecurity:    Worry: Not on file    Inability: Not on file  . Transportation needs:    Medical: Not on file    Non-medical: Not on file  Tobacco Use  . Smoking status: Former Smoker    Types: Cigarettes    Last attempt to quit: 1978    Years since quitting: 41.5  . Smokeless tobacco: Never Used  Substance and Sexual Activity  . Alcohol use: Yes    Comment: occasional glass of wine  . Drug use: Never  . Sexual activity: Not on file  Lifestyle  . Physical activity:    Days per week: Not on file    Minutes per session: Not on file  . Stress: Not on file  Relationships  . Social connections:    Talks on phone: Not on file  Gets together: Not on file    Attends religious service: Not on file    Active member of club or organization: Not on file    Attends meetings of clubs or organizations: Not on file    Relationship status: Not on file  . Intimate partner violence:    Fear of current or ex partner: Not on file    Emotionally abused: Not on file    Physically abused: Not on file    Forced sexual activity: Not on file  Other Topics Concern  . Not on file  Social History Narrative   Lives at home with her son, his girlfriend and her children.   Left-handed.   1-2 cups caffeine daily.     PHYSICAL EXAM   Vitals:   04/27/18 1442  BP: (!) 146/69  Pulse: 99    Not recorded      There is no height or weight on file to calculate BMI.  PHYSICAL EXAMNIATION:  Gen: NAD, conversant, well nourised, obese, well groomed                     Cardiovascular: Regular rate rhythm, no peripheral edema, warm, nontender. Eyes: Conjunctivae clear without exudates or hemorrhage Neck: Supple, no carotid bruits. Pulmonary: Clear to auscultation  bilaterally   NEUROLOGICAL EXAM:  MENTAL STATUS: Depressed looking elderly female, sitting her wheelchair, Speech:    Speech is normal; fluent and spontaneous with normal comprehension.  Cognition:     Orientation to time, place and person     Normal recent and remote memory     Normal Attention span and concentration     Normal Language, naming, repeating,spontaneous speech     Fund of knowledge   CRANIAL NERVES: CN II: Visual fields are full to confrontation.   Pupils are round equal and briskly reactive to light. CN III, IV, VI: extraocular movement are normal. No ptosis. CN V: Facial sensation is intact to pinprick in all 3 divisions bilaterally. Corneal responses are intact.  CN VII: Face is symmetric with normal eye closure and smile. CN VIII: Hearing is normal to rubbing fingers CN IX, X: Palate elevates symmetrically. Phonation is normal. CN XI: Head turning and shoulder shrug are intact CN XII: Tongue is midline with normal movements and no atrophy.  MOTOR: She can move bilateral upper extremity against gravity, but complains of mild difficulty with left upper extremity during left shoulder pain, she has no bilateral lower extremity proximal and distal muscles.  REFLEXES: Reflexes are 2+ and symmetric at the biceps, triceps, absent at knees and ankles. Plantar responses are flexor.  SENSORY: Withdraw to pain  COORDINATION:  There is no dysmetria on finger-to-nose  GAIT/STANCE: Nonambulatory   DIAGNOSTIC DATA (LABS, IMAGING, TESTING) - I reviewed patient records, labs, notes, testing and imaging myself where available.   ASSESSMENT AND PLAN  Claudia Walter is a 72 y.o. female   Multiple sclerosis, likely secondary progressive  Home physical therapy, nursing wound care, social worker  She has stopped long-term immunomodulation therapy since 2014,  Last MRI of the brain, cervical, thoracic spine in November 2018  New onset left leg pain  Likely neuropathic pain,  may take extra dose of gabapentin 300-600 at nighttime  Tramadol 50 mg as needed    Marcial Pacas, M.D. Ph.D.  [facility] [address], [place], [place] [address] Ph: [phone] Fax: [phone]  CC: [name], DO

## 2018-04-29 ENCOUNTER — Telehealth: Payer: Self-pay | Admitting: Gastroenterology

## 2018-04-29 ENCOUNTER — Other Ambulatory Visit: Payer: Self-pay

## 2018-04-29 DIAGNOSIS — Z1211 Encounter for screening for malignant neoplasm of colon: Secondary | ICD-10-CM

## 2018-04-29 NOTE — Telephone Encounter (Signed)
Left message to call back to [facility].

## 2018-04-29 NOTE — Telephone Encounter (Signed)
Results of prior colonoscopy arrives as followed:  Colonoscopy 06/16/2011 - Dr. Donivan Scull - redundant, long colon which was technically challenging exam. Poor prep. Recommended repeat exam in 3-5 years.   Claudia Walter it appears the patient is due for a screening colonoscopy given this report of her last exam with poor prep. She has chronic constipation and MS, her exam would need to be done at the hospital. If she is willing, can you help coordinate a screening colonoscopy at the hospital given her history of MS / immobility. Further, I think she would warrant a 2 day prep given findings on this last exam unfortunately.

## 2018-04-29 NOTE — Telephone Encounter (Signed)
Pre-visit on 06/10/18 at 11:00 am Colonoscopy at [facility] on 826/19 arrive at 9:30 am  Left a message for the patient to call.

## 2018-04-30 LAB — UA/M W/RFLX CULTURE, COMP
Bilirubin, UA: NEGATIVE
GLUCOSE, UA: NEGATIVE
Nitrite, UA: POSITIVE — AB
RBC, UA: NEGATIVE
SPEC GRAV UA: 1.021 (ref 1.005–1.030)
UUROB: 1 mg/dL (ref 0.2–1.0)

## 2018-04-30 LAB — MICROSCOPIC EXAMINATION: CASTS: NONE SEEN /LPF

## 2018-04-30 LAB — URINE CULTURE, COMPREHENSIVE

## 2018-05-02 ENCOUNTER — Telehealth: Payer: Self-pay | Admitting: Neurology

## 2018-05-02 MED ORDER — FLUCONAZOLE 100 MG PO TABS: 200.0000 mg | ORAL_TABLET | Freq: Every day | ORAL | 0 refills | Status: DC

## 2018-05-02 NOTE — Telephone Encounter (Addendum)
Spoke to patient - she is aware of the results and will start the ordered antibiotic.  She will ask her home health nurse to repeat the UA and urine culture after treatment.  States she is also making an appt with her urologist to have her catheter checked.

## 2018-05-02 NOTE — Telephone Encounter (Signed)
Please call patient, UA showed evidence of UTI, yeast infection, I have called in fluconazole 100mg  2 tabs daily x 2 weeks.  Please make sure, home health nurse repeat UA, urine culture, after treatment

## 2018-05-02 NOTE — Telephone Encounter (Signed)
I have not heard back from patient, mailed letter with pre-visit and colonoscopy appointment. If these dates are not convenient asked her to call us as soon as possible to reschedule.

## 2018-05-17 ENCOUNTER — Ambulatory Visit: Payer: BLUE CROSS/BLUE SHIELD | Admitting: Family Medicine

## 2018-05-23 ENCOUNTER — Encounter

## 2018-05-23 ENCOUNTER — Ambulatory Visit: Payer: Medicare Other | Admitting: Neurology

## 2018-06-02 ENCOUNTER — Telehealth: Payer: Self-pay

## 2018-06-02 DIAGNOSIS — E1142 Type 2 diabetes mellitus with diabetic polyneuropathy: Secondary | ICD-10-CM

## 2018-06-02 NOTE — Telephone Encounter (Signed)
Patient left message requesting referral to podiatrist.   Call back [phone]  Danley Danker, RN [facility] [place])

## 2018-06-03 NOTE — Telephone Encounter (Signed)
Referral made 

## 2018-06-10 ENCOUNTER — Telehealth: Payer: Self-pay | Admitting: Gastroenterology

## 2018-06-10 NOTE — Telephone Encounter (Signed)
Pt was scheduled for a pre-visit this morning for her procedure on 06/20/18 at [facility]. She just called stating that she cannot make it to pre-visit today, next pre-visit is on 06/16/18, too close to proc date. Pls call pt to r/s. Thank you.

## 2018-06-10 NOTE — Telephone Encounter (Signed)
Spoke to patient, she has numerous issues with rides and is dependent on her son to help her get ready. Rescheduled her pre-visit test to 8/22. Verbally went over her instruction to stop eating anything with a lot of roughage on 8/21-nuts, seeds, corn, beans. She verbalized her understanding of instructions, she is not on any BT medications. Looking back at notes she will need a 2 day prep.

## 2018-06-16 ENCOUNTER — Telehealth: Payer: Self-pay | Admitting: Gastroenterology

## 2018-06-16 ENCOUNTER — Ambulatory Visit: Payer: Medicare Other | Admitting: Family Medicine

## 2018-06-16 NOTE — Telephone Encounter (Signed)
Patient canceled pre visit for today 8.22.19. Patient is currently scheduled at hosp for this Monday 8.26.19. Patient requesting a call back to move appt at hosp and reschedule pv. Patient states her son did not get her up in time to make the bus, so she could not make 3:30pm appt today.

## 2018-06-16 NOTE — Telephone Encounter (Signed)
This is the second time that patient has cancelled her pre-visit appointment just before appointment time, she is currently scheduled for procedure at hospital on Monday 8/26. Patient is requesting to be rescheduled for both hospital date and pre-visit. Patient does not have reliable resources.

## 2018-06-16 NOTE — Telephone Encounter (Signed)
Claudia Walter is there any way she can come in for a pre-visit tomorrow, or can we go over things over the phone with her? Is she not able to make the appointment on Monday? If she can't make the appointment on Monday she should be charged a cancellation fee as we won't be able to fill that spot and this is the second time this has happened. Is she does not follow through with the next scheduled appointment, if she re-schedules, she may not be able to schedule any more procedures with [place].

## 2018-06-17 NOTE — Telephone Encounter (Signed)
Called patient this morning, had to leave a voice message. We have no available pre-visit appointments today so patient will not been given prep instructions. I let her know that I we will have to cancel her procedure for Monday at the hospital. Unfortunately, will need to charge her the cancellation fee as we were not given enough notice about canceling. I told her to call our office back and I will reschedule both pre-visit and next available hospital slot but that if she is unable to keep these appointments.

## 2018-06-20 ENCOUNTER — Ambulatory Visit (HOSPITAL_COMMUNITY): Admission: RE | Admit: 2018-06-20 | Payer: Medicare Other | Source: Ambulatory Visit | Admitting: Gastroenterology

## 2018-06-20 ENCOUNTER — Encounter (HOSPITAL_COMMUNITY): Admission: RE | Payer: Self-pay | Source: Ambulatory Visit

## 2018-06-20 SURGERY — COLONOSCOPY WITH PROPOFOL
Anesthesia: Monitor Anesthesia Care

## 2018-07-18 ENCOUNTER — Ambulatory Visit: Payer: Medicare Other | Admitting: Family Medicine

## 2018-08-01 ENCOUNTER — Telehealth: Payer: Self-pay

## 2018-08-01 NOTE — Telephone Encounter (Signed)
Sent patient a letter re: rescheduling a colonoscopy at the hospital. She will need a pre-op appointment and a 2 day prep.

## 2018-08-01 NOTE — Telephone Encounter (Signed)
-----   Message from Wyatt Haste, RN sent at 06/20/2018  2:47 PM EDT ----- Contact patient at end of Sept to see if wants to reschedule pre-visit and colonoscopy at [facility]

## 2018-08-01 NOTE — Telephone Encounter (Signed)
Caryl Pina, nurse with [employer], called nurse line stating the pt has not had a BM in over a week. Caryl Pina stated she has been using one cap full of miralax once a day since yesterday with no relief. Caryl Pina stated the patient has normal bowel sounds and just wanted to know if anything else could be done. Pt does have an upcoming apt and informed her it was probably ok to increase to two capfuls a day if still no relief in a couple of days one in morning and one in evening. Ashleys call back [phone] if you would like to speak with her.

## 2018-08-01 NOTE — Telephone Encounter (Signed)
Agree with titrating up miralax- it has only been 1 day of using the medication, I anticipate she will have a BM soon. Thanks.

## 2018-08-05 ENCOUNTER — Ambulatory Visit: Payer: Medicare Other | Admitting: Family Medicine

## 2018-09-05 ENCOUNTER — Other Ambulatory Visit: Payer: Self-pay

## 2018-09-05 ENCOUNTER — Ambulatory Visit (INDEPENDENT_AMBULATORY_CARE_PROVIDER_SITE_OTHER): Payer: Medicare Other | Admitting: Family Medicine

## 2018-09-05 ENCOUNTER — Ambulatory Visit: Payer: Medicare Other | Admitting: Psychology

## 2018-09-05 ENCOUNTER — Encounter: Payer: Self-pay | Admitting: Family Medicine

## 2018-09-05 VITALS — BP 125/68 | HR 102 | Temp 98.1°F

## 2018-09-05 DIAGNOSIS — Z658 Other specified problems related to psychosocial circumstances: Secondary | ICD-10-CM

## 2018-09-05 DIAGNOSIS — M810 Age-related osteoporosis without current pathological fracture: Secondary | ICD-10-CM

## 2018-09-05 DIAGNOSIS — Z23 Encounter for immunization: Secondary | ICD-10-CM | POA: Diagnosis not present

## 2018-09-05 DIAGNOSIS — Z789 Other specified health status: Secondary | ICD-10-CM | POA: Insufficient documentation

## 2018-09-05 DIAGNOSIS — E119 Type 2 diabetes mellitus without complications: Secondary | ICD-10-CM

## 2018-09-05 LAB — POCT GLYCOSYLATED HEMOGLOBIN (HGB A1C): HbA1c, POC (controlled diabetic range): 6.8 % (ref 0.0–7.0)

## 2018-09-05 MED ORDER — CALCIUM 600-200 MG-UNIT PO TABS: 1.0000 | ORAL_TABLET | Freq: Every day | ORAL | 11 refills | Status: DC

## 2018-09-05 NOTE — Assessment & Plan Note (Signed)
ASSESSMENT: Patient currently experiencing frustration towards her son, whom she depends on to get out of bed in the morning and into her bed every night, as she uses a motorized wheelchair. Patient reports that there may be days where she lays in bed all day if she does not receive assistance to get out. Patient reports that her son's girlfriend has been helping her get out of bed on most days and reports that it has been hard to communicate with her son to help out. [facility] intern offered a behavioral strategy to increase effective communication with son, specifically to calmly state her request to her son in the same room and continue to repeat her request until it is completed, rather than shouting from a different room and waiting. Patient reported that she would be open to trying this out this week. Patient is also interested in receiving services for either senior programs with activities (e.g., arts & crafts, bingo, lectures), senior housing that is affordable, or home assistance services.   PLAN: 1. Follow up with behavioral health clinician on : [facility] intern plans to follow-up with social work to acquire senior program/housing resources and will provide to patient over the phone.  2. Behavioral recommendations: 1) Assertive communication through calm, consistent requests to increase assistance with daily routines from her son  3. Referral(s): [employer] to [employer]

## 2018-09-05 NOTE — BH Specialist Note (Signed)
Integrated Behavioral Health Initial Visit  MRN: [id number] Name: Verlia Kaney  Number of [place] Clinician visits:: 1/6 Session Start time: 2:20 PM  Session End time: 2:40 PM Total time: 20 minutes  Type of Service: [place] Interpretor:No. Interpretor Name and Language: N/A   Warm Hand Off Completed.       SUBJECTIVE: Ashyah Quizon is a 72 y.o. female.  Patient was referred by Dr. Vanetta Shawl for community resources and her distress related to her son.  Patient reports the following symptoms/concerns: Frustration towards her son for inconsistently helping her get out of bed and into bed, given that she has mobility difficulties and uses a motorized wheelchair.  Duration of problem: Ongoing; Severity of problem: moderate  OBJECTIVE: Mood: Neutral and Affect: Appropriate Risk of harm to self or others: No evidence of SI.   LIFE CONTEXT: Family and Social: Lives with her son and her son's girlfriend.  School/Work: Not assessed.  Self-Care: Not assessed.  Life Changes: Inconsistent care from her son to assist with her mobility.   GOALS ADDRESSED: Patient will: 1. Increase knowledge and/or ability of: Communicate with her son about her care  2. Demonstrate ability to: Increase healthy adjustment to current life circumstances  INTERVENTIONS: Interventions utilized: Link to [employer] and Reflective Listening  Standardized Assessments completed: Not utilized.   ASSESSMENT: Patient currently experiencing frustration towards her son, whom she depends on to get out of bed in the morning and into her bed every night, as she uses a motorized wheelchair. Patient reports that there may be days where she lays in bed all day if she does not receive assistance to get out. Patient reports that her son's girlfriend has been helping her get out of bed on most days and reports that it has been hard to communicate with her son to help  out. [facility] intern offered a behavioral strategy to increase effective communication with son, specifically to calmly state her request to her son in the same room and continue to repeat her request until it is completed, rather than shouting from a different room and waiting. Patient reported that she would be open to trying this out this week. Patient is also interested in receiving services for either senior programs with activities (e.g., arts & crafts, bingo, lectures), senior housing that is affordable, or home assistance services.   PLAN: 1. Follow up with behavioral health clinician on : [facility] intern plans to follow-up with social work to acquire senior program/housing resources and will provide to patient over the phone.  2. Behavioral recommendations: 1) Assertive communication through calm, consistent requests to increase assistance with daily routines from her son  3. Referral(s): [employer] to [employer]

## 2018-09-05 NOTE — Patient Instructions (Addendum)
   It was great seeing you today!  Please take the vitamin D and calcium daily. We'll see you back in 3 months to check in and do labs.   If you have questions or concerns please do not hesitate to call at [phone].  Lucila Maine, DO PGY-3, [facility] 09/05/2018 2:27 PM

## 2018-09-05 NOTE — Progress Notes (Signed)
    Subjective:    Patient ID: Claudia Walter, female    DOB: 05/08/1946, 72 y.o.   MRN: [id number]   CC: follow up DM  DM- doing well, her cbgs fasting have been 110. Typically avg during day is 150-190. She has not seen an eye doctor in [place] and is interested in going to one. She also would like to see a podiatrist to keep up with foot care, she's unable to do it herself due to MS/wheelchair status.   She could not afford prolia for her osteoarthritis. She did get this medication twice when she was living in [place] and did not have to pay for it. To her knowledge she was not offered an oral bisphosphonate. She is not sure why. She is unable to sit straight up due to the MS/pain when doing this. She also has reflux. She is not taking vitamin D or calcium supplements.  On medication review patient only takes wellbutrin as prescribed. She takes her other medications in the morning then maybe in the evening as needed. She tries to take metformin daily but often forgets.   She is hoping to speak with [facility] for support socially. She lives with her adult son who she calls an alcoholic. He does not care for her as much as she would like. She is dependent on him for transportation. She would like information about going to an adult day program during the day.   Smoking status reviewed- former smoker  Review of Systems- see HPI   Objective:  BP 125/68   Pulse (!) 102   Temp 98.1 F (36.7 C) (Oral)   SpO2 94%  Vitals and nursing note reviewed  General: wheelchair bound, well nourished, in no acute distress HEENT: normocephalic, MMM Cardiac: RRR, clear S1 and S2, no murmurs, rubs, or gallops Respiratory: clear to auscultation bilaterally, no increased work of breathing Neuro: alert and oriented, no focal deficits   Assessment & Plan:    Type 2 diabetes mellitus (HCC)  A1C 6.8 today, will continue current regimen of metformin 500 mg daily. Referral to ophtho and podiatry  made.  Osteoporosis  Patient unable to afford prolia. Not a good candidate for bisphosphonate due to gerd and inability to stand upright. Will supplement calcium and vitamin D.  Inadequate social support  Warm handoff to [facility] today, see their note for details.     Return in about 3 months (around 12/06/2018).   Lucila Maine, DO Family Medicine Resident PGY-3

## 2018-09-06 ENCOUNTER — Encounter: Payer: Self-pay | Admitting: Family Medicine

## 2018-09-06 DIAGNOSIS — Z658 Other specified problems related to psychosocial circumstances: Secondary | ICD-10-CM | POA: Insufficient documentation

## 2018-09-06 NOTE — Assessment & Plan Note (Signed)
  Warm handoff to [facility] today, see their note for details.

## 2018-09-06 NOTE — Assessment & Plan Note (Signed)
  Patient unable to afford prolia. Not a good candidate for bisphosphonate due to gerd and inability to stand upright. Will supplement calcium and vitamin D.

## 2018-09-06 NOTE — Addendum Note (Signed)
Addended by: Zella Ball E on: 09/06/2018 02:30 PM   Modules accepted: Level of Service

## 2018-09-06 NOTE — Assessment & Plan Note (Signed)
  A1C 6.8 today, will continue current regimen of metformin 500 mg daily. Referral to ophtho and podiatry made.

## 2018-09-12 ENCOUNTER — Other Ambulatory Visit: Payer: Self-pay | Admitting: Family Medicine

## 2018-09-12 ENCOUNTER — Telehealth: Payer: Self-pay | Admitting: Psychology

## 2018-09-12 DIAGNOSIS — G35 Multiple sclerosis: Secondary | ICD-10-CM

## 2018-09-12 DIAGNOSIS — Z9359 Other cystostomy status: Secondary | ICD-10-CM
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
## 2018-09-12 NOTE — Progress Notes (Addendum)
  Discussed patient with integrated care team, will re-order home health aide services for patient. I have also ordered a home CSW consult and adding RN services as well.  Lucila Maine, DO PGY-3, [facility] 09/12/2018 11:28 AM

## 2018-09-13 ENCOUNTER — Telehealth: Payer: Self-pay | Admitting: Psychology

## 2018-09-13 NOTE — Addendum Note (Signed)
Addended by: Lucila Maine C on: 09/13/2018 11:41 AM   Modules accepted: Orders
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
## 2018-09-14 NOTE — Progress Notes (Signed)
Orders and demographics faxed to [facility] @ [phone]. Fleeger, Salome Spotted, CMA

## 2018-09-15 ENCOUNTER — Ambulatory Visit: Payer: Medicare Other | Admitting: Podiatry

## 2018-09-15 NOTE — Telephone Encounter (Signed)
Thank you for the update!

## 2018-09-27 ENCOUNTER — Ambulatory Visit: Payer: Medicare Other | Admitting: Podiatry

## 2018-10-04 ENCOUNTER — Ambulatory Visit: Payer: Medicare Other | Admitting: Podiatry

## 2018-10-10 ENCOUNTER — Ambulatory Visit (INDEPENDENT_AMBULATORY_CARE_PROVIDER_SITE_OTHER): Payer: Medicare Other | Admitting: Podiatry

## 2018-10-10 ENCOUNTER — Encounter: Payer: Self-pay | Admitting: Podiatry

## 2018-10-10 VITALS — BP 91/49

## 2018-10-10 DIAGNOSIS — M79675 Pain in left toe(s): Secondary | ICD-10-CM | POA: Diagnosis not present

## 2018-10-10 DIAGNOSIS — B351 Tinea unguium: Secondary | ICD-10-CM | POA: Diagnosis not present

## 2018-10-10 DIAGNOSIS — G35 Multiple sclerosis: Secondary | ICD-10-CM

## 2018-10-10 DIAGNOSIS — G5793 Unspecified mononeuropathy of bilateral lower limbs: Secondary | ICD-10-CM

## 2018-10-10 DIAGNOSIS — M79674 Pain in right toe(s): Secondary | ICD-10-CM

## 2018-10-10 NOTE — Patient Instructions (Signed)

## 2018-10-11 ENCOUNTER — Encounter: Payer: Self-pay | Admitting: Podiatry

## 2018-10-11 NOTE — Progress Notes (Signed)
Subjective: Claudia Walter presents today with history of multiple sclerosis, neuropathy with cc of painful, mycotic toenails.  Pain is aggravated when wearing enclosed shoe gear.  She moved to [place] from [place] one year ago.  Patient has peripheral neuropathy managed with gabapentin.  She is nonambulatory due to multiple infections/surgeries right hip which resulted in non-functional right limb.  She is confined to a motorized chair and requires assistance with transfers.  Past Medical History:  Diagnosis Date  . Depression   . Diabetes mellitus without complication ([place])   . Hyperlipemia   . Osteoporosis     Patient Active Problem List   Diagnosis Date Noted  . Inadequate social support 09/06/2018  . Needs assistance with community resources 09/05/2018  . Gastroesophageal reflux disease   . Gastric ulcer without hemorrhage or perforation   . Hiatal hernia 12/15/2017  . Anemia 12/15/2017  . Multiple sclerosis ([place]) 12/14/2017  . Neurogenic bladder 12/14/2017  . Suprapubic catheter ([place]) 12/14/2017  . Osteoporosis 12/14/2017  . Type 2 diabetes mellitus ([place]) 12/14/2017  . Hyperlipidemia 12/14/2017    Past Surgical History:  Procedure Laterality Date  . CESAREAN SECTION N/A    X 2  . ESOPHAGOGASTRODUODENOSCOPY (EGD) WITH PROPOFOL N/A 04/12/2018   Procedure: ESOPHAGOGASTRODUODENOSCOPY (EGD) WITH PROPOFOL;  Surgeon: Yetta Flock, MD;  Location: [facility];  Service: Gastroenterology;  Laterality: N/A;  . HIP FRACTURE SURGERY Right      Current Outpatient Medications:  .  baclofen (LIORESAL) 20 MG tablet, Take 1 tablet (20 mg total) by mouth 2 (two) times daily., Disp: 60 each, Rfl: 11 .  buPROPion (WELLBUTRIN) 75 MG tablet, Take 1 tablet (75 mg total) by mouth daily., Disp: 90 tablet, Rfl: 4 .  Calcium 600-200 MG-UNIT tablet, Take 1 tablet by mouth daily., Disp: 30 tablet, Rfl: 11 .  Cyanocobalamin (VITAMIN B-12 PO), Place 1 mL under the tongue daily., Disp: , Rfl:  .   fluconazole (DIFLUCAN) 100 MG tablet, Take 2 tablets (200 mg total) by mouth daily. 200mg  daily for 2 weeks, Disp: 28 tablet, Rfl: 0 .  gabapentin (NEURONTIN) 300 MG capsule, Take 1 capsule (300 mg total) by mouth 2 (two) times daily. (Patient taking differently: Take 600 mg by mouth 2 (two) times daily. ), Disp: 180 capsule, Rfl: 4 .  metFORMIN (GLUCOPHAGE) 500 MG tablet, Take 500 mg by mouth daily with breakfast. , Disp: , Rfl:  .  omeprazole (PRILOSEC) 20 MG capsule, Take 1-2 capsules (20-40 mg total) by mouth daily. (Patient taking differently: Take 20 mg by mouth daily. ), Disp: 90 capsule, Rfl: 1 .  oxybutynin (DITROPAN) 5 MG tablet, Take 5 mg by mouth daily. , Disp: , Rfl:  .  polyethylene glycol (MIRALAX / GLYCOLAX) packet, Take 17 g by mouth daily as needed for moderate constipation., Disp: , Rfl:  .  pravastatin (PRAVACHOL) 20 MG tablet, Take 1 tablet (20 mg total) by mouth daily., Disp: 90 tablet, Rfl: 3 .  traMADol (ULTRAM) 50 MG tablet, Take 1 tablet (50 mg total) by mouth every 6 (six) hours as needed. (Patient taking differently: Take 50 mg by mouth every 6 (six) hours as needed for moderate pain. ), Disp: 30 tablet, Rfl: 4 .  vitamin C (ASCORBIC ACID) 500 MG tablet, Take 1,000 mg by mouth daily. , Disp: , Rfl:   Allergies  Allergen Reactions  . Sulfa Antibiotics Nausea And Vomiting    Social History   Occupational History  . Occupation: Disabled  Tobacco Use  . Smoking  status: Former Smoker    Types: Cigarettes    Last attempt to quit: 1978    Years since quitting: 41.9  . Smokeless tobacco: Never Used  Substance and Sexual Activity  . Alcohol use: Yes    Comment: occasional glass of wine  . Drug use: Never  . Sexual activity: Not on file    Family History  Problem Relation Age of Onset  . Diabetes Mother   . Heart attack Mother   . Colon cancer Father     Immunization History  Administered Date(s) Administered  . Influenza,inj,Quad PF,6+ Mos 09/05/2018     Review of systems: Positive Findings in bold print.  Constitutional:  chills, fatigue, fever, sweats, weight change Communication: Optometrist, sign Ecologist, hand writing, iPad/Android device Eyes: diplopia, glare,  light sensitivity, eyeglasses, blindness Ears nose mouth throat: Hard of hearing, deaf, sign language,  vertigo,   bloody nose,  rhinitis,  cold sores, snoring Cardiovascular: HTN, edema, arrhythmia, pacemaker in place, defibrillator in place,  chest pain/tightness, chronic anticoagulation, blood clot Respiratory:  difficulty breathing, denies congestion, SOB, wheezing, cough Gastrointestinal: abdominal pain, diarrhea, nausea, vomiting,  Genitourinary:  nocturia,  pain on urination,  blood in urine, Foley catheter, urinary urgency Musculoskeletal: Uses mobility aid,  cramping, stiff joints, painful joints,  Skin: +changes in toenails, color change dryness, itchy skin, mole changes, or rash  Neurological: numbness, paresthesias, burning in feet, denies fainting,  seizure, change in speech. denies headaches, memory problems/poor historian, cerebral palsy Endocrine: diabetes, hypothyroidism, hyperthyroidism,  dry mouth, flushing, denies heat intolerance,  cold intolerance,  excessive thirst, denies polyuria,  nocturia Hematological:  easy bleeding,  excessive bleeding, easy bruising, enlarged lymph nodes, on long term blood thinner Allergy/immunological:  hives, frequent infections, multiple drug allergies, seasonal allergies,  Psychiatric:  anxiety, depression, mood disorder, suicidal ideations, hallucinations   Objective:  Vascular Examination: Capillary refill time <3 seconds x 10 digits Dorsalis pedis 2/4 b/l Posterior tibial pulses 1/4 b/l Digital hair x 10 digits was decreased Skin temperature WNL b/l  Dermatological Examination: Skin with age related and disuse atrophy b/l  Toenails 1-5 right, 2-5 left discolored, thick, dystrophic with subungual debris  and pain with palpation to nailbeds due to thickness of nails. Onycholysis of right 3rd digit nailplate with clean nailbed. No erythema, no edema, no drainage.  Musculoskeletal: Muscle strength 0/5 RLE, 3/5 LLE  Neurological: Sensation with 10 gram monofilament is diminished RLE, intact LLE Vibratory sensation is diminished RLE, intact LLE  Assessment: 1. Painful onychomycosis toenails 1-5 right, 2-5 left  2. Multiple sclerosis 3. Peripheral neuropathy  Plan: 1. Toenails 1-5 right, 2-5 left were debrided in length and girth without iatrogenic bleeding. 2. Patient to continue soft, supportive shoe gear 3. Patient to report any pedal injuries to medical professional  4. Follow up 3 months. Patient/POA to call should there be a concern in the interim.

## 2018-11-01 ENCOUNTER — Telehealth: Payer: Self-pay | Admitting: Neurology

## 2018-11-01 ENCOUNTER — Other Ambulatory Visit: Payer: Self-pay | Admitting: *Deleted

## 2018-11-01 MED ORDER — BUPROPION HCL 75 MG PO TABS: 75.0000 mg | ORAL_TABLET | Freq: Every day | ORAL | 0 refills | Status: DC

## 2018-11-01 MED ORDER — BUPROPION HCL 75 MG PO TABS: 75.0000 mg | ORAL_TABLET | Freq: Two times a day (BID) | ORAL | 0 refills | Status: DC

## 2018-11-01 NOTE — Telephone Encounter (Addendum)
Per rx sent in by Dr. Krista Blue to mail order, the patient is taking generic Wellbutrin 75mg , one tablet daily.  A 30-day supply was sent to [facility] so she will not run out of medication while waiting on her mail order delivery (#30 x 0).  Initially, a prescription was sent to [facility] for twice daily dosing.  This was in error and that prescription was canceled at the pharmacy (spoke to [place] at [facility] who voided the incorrect rx).

## 2018-11-01 NOTE — Telephone Encounter (Signed)
Pt is asking if 5 buPROPion (WELLBUTRIN) 75 MG tablet can be called into  [facility] #[id number] While she waits for her mail order from  [facility]

## 2018-11-01 NOTE — Addendum Note (Signed)
Addended by: Noberto Retort C on: 11/01/2018 10:49 AM   Modules accepted: Orders

## 2018-11-02 ENCOUNTER — Telehealth: Payer: Self-pay | Admitting: *Deleted

## 2018-11-02 NOTE — Telephone Encounter (Signed)
Thanks

## 2018-11-02 NOTE — Telephone Encounter (Signed)
[facility] wanted to let MD know that they have re certified pt and will continue to see her. Cloris Flippo, Salome Spotted, CMA

## 2018-11-15 NOTE — Progress Notes (Signed)
[facility]  PATIENT: Claudia Walter DOB: 1946-09-18   REASON FOR VISIT: Follow-up for multiple sclerosis, HISTORY FROM: Patient     HISTORY OF PRESENT ILLNESS: Claudia Walter is a 73 year old female, seen in refer by primary care physician [name], for evaluation of relapsing remitting multiple sclerosis, initial evaluation was on January 18, 2018,  She was brought in by transportation, in electronic wheelchair, alone at today's clinical visit,  I was able to review old her previous neurological evaluation from [facility] by Dr.   [name], most recent note was on May 14, 2017,  She is a retired Public relations account executive, was diagnosed with multiple sclerosis since 1993, she presented with thoracic myelopathy, initial symptoms involved pinching around her waist felt like tightening sensation, paresthesia involving all limbs, lumbar puncture was positive, Evoked potential was positive,  She had slowly progressive worsening gait abnormality, remembered in 2000, after her mother passed away, she began to ambulate with a cane, in 2005, after her husband passed away, she began to use her walker, has to quit her job due to worsening gait difficulty, was given a course of IV steroid, did improve her strength temporarily, Betaseron since 2004, she moved to [place] in 2007 due to family stress, was followed by local neurologist [name] at, , [facility], noticed continue progressive gait abnormality, eventually begin to use wheelchair in 2009, but still able to take a few steps with his walker, switched to Copaxone in 2013,   In 2014 she injured her right hip, later also suffered right tibial and fibular fracture, was treated with a brace for 8 weeks, was never able to walk again, she also stopped long-term immunomodification therapy since 2014, had a short trial of Ampyra without helping her gait.   Per record, last MRI was in 2018,  showed stable hemisphere lesions,  Laboratory evaluation per record showed: vitamin D was 54, she is JC virus positive with index of 0.41, Tegretol level was 9.4,  She is now living with her son, and his girlfriend and children, she is tearful during today's interview, she has been in her current wheelchair for 10 years, it has become malfunction, she has more difficulty positioning herself in the wheelchair, also complains of worsening bilateral hip pain due to difficulty positioning, developed decubitus ulcer.  She had suprapubic catheter for 10 years, was recently evaluated by urologist [name] at [facility] urologist, ultrasound of kidney was reported normal  MRI of the brain without contrast in November 2018 from [place] reported mild cerebral atrophy, with proportional ventricular dilatation, scattered supratentorium periventricular, and subcortical white matter hyper intensity, was reported as stable,  Laboratory evaluation in June 2018, normal CBC hemoglobin of 12.8, CMP creatinine of 0.76, normal TSH 3.78, vitamin D of 41, mild elevated alkaline phosphate 158, B12 is 564,  UPDATE April 27 2018:YY She continue with electronic wheelchair today, was dropped in by transportation, she reported intermittent episode of sharp pain starting at left knee, radiating to left shin, lasting for hours, ibuprofen only provide limited help  She also reported that this morning, she was very tired, difficulty to be awakened, similar to the symptoms she had previously for her UTI, UPDATE 1/22/2020CM Claudia Walter, 73 year old female returns for follow-up with history of multiple sclerosis likely secondary progressive stopped her immunomodulation therapy in 2014.  She was diagnosed in 1993.  She has a new Clinical research associate.  At her last visit she was complaining of intermittent  sharp pain at the left knee but that is much better and she is no longer taking tramadol.  She continues to have home health  for CNA services for bathing and her suprapubic catheter is changed by nursing.  She did have a decubitus ulcer but that is mostly healed.  She was getting physical therapy but has plateaued.  She remains on baclofen Wellbutrin and Neurontin from this office.  She requires help to transfer from bed to to wheelchair.  She lives with her son and his girlfriend.  Last MRI of the brain in 2018 was stable.  She returns for reevaluation  REVIEW OF SYSTEMS: Full 14 system review of systems performed and notable only for those listed, all others are neg:  Constitutional: neg  Cardiovascular: neg Ear/Nose/Throat: neg  Skin: neg Eyes: neg Respiratory: neg Gastroitestinal: neg  Hematology/Lymphatic: neg  Endocrine: neg Musculoskeletal: Gait disorder due to lower extremity weakness Allergy/Immunology: neg Neurological: neg Psychiatric: Depression  Sleep : neg   ALLERGIES: Allergies  Allergen Reactions  . Sulfa Antibiotics Nausea And Vomiting    HOME MEDICATIONS: Outpatient Medications Prior to Visit  Medication Sig Dispense Refill  . baclofen (LIORESAL) 20 MG tablet Take 1 tablet (20 mg total) by mouth 2 (two) times daily. 60 each 11  . buPROPion (WELLBUTRIN) 75 MG tablet Take 1 tablet (75 mg total) by mouth daily. 30 tablet 0  . Calcium 600-200 MG-UNIT tablet Take 1 tablet by mouth daily. 30 tablet 11  . Cyanocobalamin (VITAMIN B-12 PO) Place 1 mL under the tongue daily.    . fluconazole (DIFLUCAN) 100 MG tablet Take 2 tablets (200 mg total) by mouth daily. 200mg  daily for 2 weeks 28 tablet 0  . gabapentin (NEURONTIN) 300 MG capsule Take 1 capsule (300 mg total) by mouth 2 (two) times daily. (Patient taking differently: Take 600 mg by mouth 2 (two) times daily. ) 180 capsule 4  . metFORMIN (GLUCOPHAGE) 500 MG tablet Take 500 mg by mouth daily with breakfast.     . omeprazole (PRILOSEC) 20 MG capsule Take 1-2 capsules (20-40 mg total) by mouth daily. (Patient taking differently: Take 20 mg by  mouth daily. ) 90 capsule 1  . oxybutynin (DITROPAN) 5 MG tablet Take 5 mg by mouth daily.     . polyethylene glycol (MIRALAX / GLYCOLAX) packet Take 17 g by mouth daily as needed for moderate constipation.    . pravastatin (PRAVACHOL) 20 MG tablet Take 1 tablet (20 mg total) by mouth daily. 90 tablet 3  . traMADol (ULTRAM) 50 MG tablet Take 1 tablet (50 mg total) by mouth every 6 (six) hours as needed. (Patient taking differently: Take 50 mg by mouth every 6 (six) hours as needed for moderate pain. ) 30 tablet 4  . vitamin C (ASCORBIC ACID) 500 MG tablet Take 1,000 mg by mouth daily.     Marland Kitchen buPROPion (WELLBUTRIN) 75 MG tablet Take 1 tablet (75 mg total) by mouth daily. 90 tablet 4   No facility-administered medications prior to visit.     PAST MEDICAL HISTORY: Past Medical History:  Diagnosis Date  . Depression   . Diabetes mellitus without complication ([place])   . Hyperlipemia   . Osteoporosis     PAST SURGICAL HISTORY: Past Surgical History:  Procedure Laterality Date  . CESAREAN SECTION N/A    X 2  . ESOPHAGOGASTRODUODENOSCOPY (EGD) WITH PROPOFOL N/A 04/12/2018   Procedure: ESOPHAGOGASTRODUODENOSCOPY (EGD) WITH PROPOFOL;  Surgeon: Yetta Flock, MD;  Location: [facility];  Service: Gastroenterology;  Laterality: N/A;  . HIP FRACTURE SURGERY Right     FAMILY HISTORY: Family History  Problem Relation Age of Onset  . Diabetes Mother   . Heart attack Mother   . Colon cancer Father     SOCIAL HISTORY: Social History   Socioeconomic History  . Marital status: Widowed    Spouse name: Not on file  . Number of children: 2  . Years of education: college  . Highest education level: Bachelor's degree (e.g., BA, AB, BS)  Occupational History  . Occupation: Disabled  Social Needs  . Financial resource strain: Not on file  . Food insecurity:    Worry: Not on file    Inability: Not on file  . Transportation needs:    Medical: Not on file    Non-medical: Not on file    Tobacco Use  . Smoking status: Former Smoker    Types: Cigarettes    Last attempt to quit: 1978    Years since quitting: 42.0  . Smokeless tobacco: Never Used  Substance and Sexual Activity  . Alcohol use: Yes    Comment: occasional glass of wine  . Drug use: Never  . Sexual activity: Not on file  Lifestyle  . Physical activity:    Days per week: Not on file    Minutes per session: Not on file  . Stress: Not on file  Relationships  . Social connections:    Talks on phone: Not on file    Gets together: Not on file    Attends religious service: Not on file    Active member of club or organization: Not on file    Attends meetings of clubs or organizations: Not on file    Relationship status: Not on file  . Intimate partner violence:    Fear of current or ex partner: Not on file    Emotionally abused: Not on file    Physically abused: Not on file    Forced sexual activity: Not on file  Other Topics Concern  . Not on file  Social History Narrative   Lives at home with her son, his girlfriend and her children.   Left-handed.   1-2 cups caffeine daily.     PHYSICAL EXAM  Vitals:   11/16/18 1510  Height: 5' (1.524 m)   Body mass index is 29.29 kg/m.  Generalized: Well developed, obese female in no acute distress well-groomed Head: normocephalic and atraumatic,. Oropharynx benign  Neck: Supple,  Musculoskeletal: No deformity   Neurological examination   Mentation: Alert oriented to time, place, history taking. Attention span and concentration appropriate. Recent and remote memory intact.  Follows all commands speech and language fluent.   Cranial nerve II-XII: Pupils were equal round reactive to light extraocular movements were full, visual field were full on confrontational test. Facial sensation and strength were normal. hearing was intact to finger rubbing bilaterally. Uvula tongue midline. head turning and shoulder shrug were normal and symmetric.Tongue protrusion  into cheek strength was normal. Motor: normal bulk and tone, full strength in the BUE, she has bilateral lower extremity proximal and distal weakness  Sensory: Withdraws to pain Coordination: finger-nose-finger, , no dysmetria Reflexes: Brachioradialis 2/2, biceps 2/2, triceps 2/2, patellar absent Achilles absent plantar responses were flexor bilaterally. Gait and Station: Nonambulatory  DIAGNOSTIC DATA (LABS, IMAGING, TESTING) - I reviewed patient records, labs, notes, testing and imaging myself where available.  Lab Results  Component Value Date   WBC 8.3 12/14/2017   HGB  11.7 12/14/2017   HCT 35.7 12/14/2017   MCV 83 12/14/2017   PLT 437 (H) 12/14/2017      Component Value Date/Time   NA 141 12/14/2017 1540   K 4.5 12/14/2017 1540   CL 99 12/14/2017 1540   CO2 23 12/14/2017 1540   GLUCOSE 96 12/14/2017 1540   BUN 19 12/14/2017 1540   CREATININE 0.78 12/14/2017 1540   CALCIUM 9.6 12/14/2017 1540   GFRNONAA 76 12/14/2017 1540   GFRAA 88 12/14/2017 1540   Lab Results  Component Value Date   CHOL 250 (H) 12/14/2017   HDL 41 12/14/2017   LDLCALC 159 (H) 12/14/2017   TRIG 249 (H) 12/14/2017   CHOLHDL 6.1 (H) 12/14/2017   Lab Results  Component Value Date   HGBA1C 6.8 09/05/2018    ASSESSMENT AND PLAN  73 y.o. year old female  has a past medical history of Depression, Diabetes mellitus without complication ([place]), Hyperlipemia, and Osteoporosis.  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  She stopped long-term immunomodulation therapy in 2014.   MRI of the brain, cervical, thoracic spine in November 2018  PLAN: Continue home health nursing services Continues baclofen at current dose Continue Wellbutrin at current dose will refill Continue Neurontin at current dose F/U in 8 months Claudia Walter, [facility], [facility], [facility] [address] [place], [place] [address] [phone]

## 2018-11-16 ENCOUNTER — Ambulatory Visit (INDEPENDENT_AMBULATORY_CARE_PROVIDER_SITE_OTHER): Payer: Medicare Other | Admitting: Nurse Practitioner

## 2018-11-16 ENCOUNTER — Encounter: Payer: Self-pay | Admitting: Nurse Practitioner

## 2018-11-16 VITALS — BP 105/59 | HR 95 | Ht 60.0 in

## 2018-11-16 DIAGNOSIS — G35 Multiple sclerosis: Secondary | ICD-10-CM

## 2018-11-16 DIAGNOSIS — R269 Unspecified abnormalities of gait and mobility: Secondary | ICD-10-CM

## 2018-11-16 DIAGNOSIS — N319 Neuromuscular dysfunction of bladder, unspecified: Secondary | ICD-10-CM | POA: Diagnosis not present

## 2018-11-16 MED ORDER — BUPROPION HCL 75 MG PO TABS: 75.0000 mg | ORAL_TABLET | Freq: Every day | ORAL | 1 refills | Status: DC

## 2018-11-16 MED ORDER — GABAPENTIN 300 MG PO CAPS: 600.0000 mg | ORAL_CAPSULE | Freq: Two times a day (BID) | ORAL | 1 refills | Status: DC

## 2018-11-16 NOTE — Patient Instructions (Signed)
Continue home health nursing services Continues back baclofen at current dose Continue Wellbutrin at current dose will refill Continue Neurontin at current dose F/U in 8 months

## 2018-11-17 ENCOUNTER — Telehealth: Payer: Self-pay

## 2018-11-17 DIAGNOSIS — G35 Multiple sclerosis: Secondary | ICD-10-CM

## 2018-11-17 NOTE — Telephone Encounter (Signed)
Jenny Reichmann, RN with [facility], called. Patient requesting a new mattress.   Needs order sent. Please call Cindy at [phone].  Danley Danker, RN [facility] RN)

## 2018-11-17 NOTE — Progress Notes (Signed)
I have reviewed and agreed above plan. 

## 2018-11-23 NOTE — Telephone Encounter (Signed)
Community message sent to [facility]. Danley Danker, RN [facility] RN)

## 2018-11-23 NOTE — Telephone Encounter (Signed)
  I called Cindy back, it was difficult to hear her, she said I need to put in a DME order for mattress. DME order placed.  Lucila Maine, DO PGY-3, [facility] 11/23/2018 9:53 AM

## 2018-12-30 ENCOUNTER — Telehealth: Payer: Self-pay

## 2018-12-30 NOTE — Telephone Encounter (Signed)
Dorian Pod from [facility] called for verbal orders:  Re-certification for monthly suprapubic cath and also MSW evaluation for needs.  Call back is [phone]  Danley Danker, RN [facility])

## 2019-01-03 ENCOUNTER — Encounter (INDEPENDENT_AMBULATORY_CARE_PROVIDER_SITE_OTHER): Payer: Self-pay

## 2019-01-03 ENCOUNTER — Ambulatory Visit (INDEPENDENT_AMBULATORY_CARE_PROVIDER_SITE_OTHER): Payer: Medicare Other | Admitting: Gastroenterology

## 2019-01-03 ENCOUNTER — Encounter: Payer: Self-pay | Admitting: Gastroenterology

## 2019-01-03 ENCOUNTER — Other Ambulatory Visit (INDEPENDENT_AMBULATORY_CARE_PROVIDER_SITE_OTHER): Payer: Medicare Other

## 2019-01-03 VITALS — BP 130/60 | HR 92

## 2019-01-03 DIAGNOSIS — Z800 Family history of malignant neoplasm of digestive organs: Secondary | ICD-10-CM | POA: Diagnosis not present

## 2019-01-03 DIAGNOSIS — R1031 Right lower quadrant pain: Secondary | ICD-10-CM | POA: Diagnosis not present

## 2019-01-03 DIAGNOSIS — K219 Gastro-esophageal reflux disease without esophagitis: Secondary | ICD-10-CM

## 2019-01-03 LAB — CBC WITH DIFFERENTIAL/PLATELET
Basophils Absolute: 0.1 10*3/uL (ref 0.0–0.1)
Basophils Relative: 0.7 % (ref 0.0–3.0)
Eosinophils Absolute: 0.2 10*3/uL (ref 0.0–0.7)
Eosinophils Relative: 2.5 % (ref 0.0–5.0)
HCT: 36.5 % (ref 36.0–46.0)
Hemoglobin: 12 g/dL (ref 12.0–15.0)
Lymphocytes Relative: 31.6 % (ref 12.0–46.0)
Lymphs Abs: 2.9 10*3/uL (ref 0.7–4.0)
MCHC: 32.8 g/dL (ref 30.0–36.0)
MCV: 83.3 fl (ref 78.0–100.0)
MONOS PCT: 6.4 % (ref 3.0–12.0)
Monocytes Absolute: 0.6 10*3/uL (ref 0.1–1.0)
Neutro Abs: 5.3 10*3/uL (ref 1.4–7.7)
Neutrophils Relative %: 58.8 % (ref 43.0–77.0)
Platelets: 444 10*3/uL — ABNORMAL HIGH (ref 150.0–400.0)
RBC: 4.38 Mil/uL (ref 3.87–5.11)
RDW: 14.1 % (ref 11.5–15.5)
WBC: 9.1 10*3/uL (ref 4.0–10.5)

## 2019-01-03 MED ORDER — DICYCLOMINE HCL 10 MG PO CAPS: 10.0000 mg | ORAL_CAPSULE | Freq: Three times a day (TID) | ORAL | 0 refills | Status: DC | PRN

## 2019-01-03 NOTE — Telephone Encounter (Signed)
VO given to [employer]

## 2019-01-03 NOTE — Patient Instructions (Signed)
If you are age 74 or older, your body mass index should be between 23-30. Your There is no height or weight on file to calculate BMI. If this is out of the aforementioned range listed, please consider follow up with your Primary Care Provider.  If you are age 64 or younger, your body mass index should be between 19-25. Your There is no height or weight on file to calculate BMI. If this is out of the aformentioned range listed, please consider follow up with your Primary Care Provider.   Please go to the lab in the basement of our building to have lab work done as you leave today. Hit "B" for basement when you get on the elevator.  When the doors open the lab is on your left.  We will call you with the results. Thank you.   You have been scheduled for a CT scan of the abdomen and pelvis at [facility] ([address]---this is in the same building as [facility]).   You are scheduled on Thursday, 01-12-19 at 9:30am. You should arrive 15 minutes prior to your appointment time for registration. Please follow the written instructions below on the day of your exam:  WARNING: IF YOU ARE ALLERGIC TO IODINE/X-RAY DYE, PLEASE NOTIFY RADIOLOGY IMMEDIATELY AT [phone]! YOU WILL BE GIVEN A 13 HOUR PREMEDICATION PREP.  1) Do not eat anything after 5:30am (4 hours prior to your test) 2) You have been given 2 bottles of oral contrast to drink. The solution may taste better if refrigerated, but do NOT add ice or any other liquid to this solution. Shake well before drinking.    Drink 1 bottle of contrast @ 7:30am (2 hours prior to your exam)  Drink 1 bottle of contrast @ 8:30am (1 hour prior to your exam)  You may take any medications as prescribed with a small amount of water, if necessary. If you take any of the following medications: METFORMIN, GLUCOPHAGE, GLUCOVANCE, AVANDAMET, RIOMET, FORTAMET, ACTOPLUS MET, JANUMET, GLUMETZA or METAGLIP, you MAY be asked to HOLD this medication 48  hours AFTER the exam.  The purpose of you drinking the oral contrast is to aid in the visualization of your intestinal tract. The contrast solution may cause some diarrhea. Depending on your individual set of symptoms, you may also receive an intravenous injection of x-ray contrast/dye. Plan on being at [facility] for 30 minutes or longer, depending on the type of exam you are having performed.  This test typically takes 30-45 minutes to complete.  If you have any questions regarding your exam or if you need to reschedule, you may call the CT department at [phone] between the hours of 8:00 am and 5:00 pm, Monday-Friday.  ______________________________________________________  We have sent the following medications to your pharmacy for you to pick up at your convenience: Bentyl 10mg: Take every 8 hours as needed  Thank you for entrusting me with your care and for choosing [facility], Dr. Steven Armbruster      

## 2019-01-03 NOTE — Progress Notes (Signed)
HPI :  73 y/o female here for a follow up visit. She has a history of MS, she is wheelchair bound due to this issue. I have seen her in the past for reflux and constipation.   Since that last seen her she had an endoscopy in June 2019. She had a 3 cm hiatal hernia, benign gastric polyps with a small superficial gastric ulcer. Biopsies negative for H. Pylori. We increased her dose of omeprazole to 40 mg daily. For the most part this is been controlling her reflux symptoms fairly well. She denies any routine rake through with this.  We also got the report from her prior colonoscopy done in 2012, at which point this was noted to be a long and redundant colon which was a technically challenging exam in the setting of a poor prep. It was recommended to repeat the exam in 3-5 years. At one point in time she had scheduled a colonoscopy at the hospital, but then canceled. She requires hospitalization with assistance for bowel prep to complete the exam.  I have given her some Linzess for constipation dosed at 145 g per day. This was too strong for her and caused loose stools. She has been using MiraLAX as needed. She has about 2 bowel movements a week which was her typical regimen and she states she feels okay on this regimen. She is not having any obvious blood in the stools. She denies any weight loss.  Her main complaint is having episodic pain in the right lower quadrant. This been going on for a few months now. She states after she is eating something heavy she will feel some intense pain in the right lower quadrant which can last for several hours. She finds lying down will eventually help make this go away the following morning after start. She otherwise denies any trauma to the area or other clear precipitants. She denies any nausea or vomiting with this. No fevers. She's had about 4 total episodes over the past few months. In between these episodes she does not have any pain at all. Bowel movements do  not help the pain go away.  Father had CRC diagnosed at age 48s  EGD 04/12/18 - 3cm HH, benign gastric hyperplastic polyps, small superficial ulceration, biopsies for H pylori negative  Colonoscopy 06/16/2011 - Dr. Donivan Scull - redundant, long colon which was technically challenging exam. Poor prep. Recommended repeat exam in 3-5 years.    Past Medical History:  Diagnosis Date  . Depression   . Diabetes mellitus without complication ([place])   . GERD (gastroesophageal reflux disease)   . Hyperlipemia   . Multiple sclerosis ([place])   . Osteoporosis      Past Surgical History:  Procedure Laterality Date  . CESAREAN SECTION N/A    X 2  . ESOPHAGOGASTRODUODENOSCOPY (EGD) WITH PROPOFOL N/A 04/12/2018   Procedure: ESOPHAGOGASTRODUODENOSCOPY (EGD) WITH PROPOFOL;  Surgeon: Yetta Flock, MD;  [facility];  Service: Gastroenterology;  Laterality: N/A;  . HIP FRACTURE SURGERY Right    Family History  Problem Relation Age of Onset  . Diabetes Mother   . Heart attack Mother   . Colon cancer Father    Social History   Tobacco Use  . Smoking status: Former Smoker    Types: Cigarettes    Last attempt to quit: 1978    Years since quitting: 42.2  . Smokeless tobacco: Never Used  Substance Use Topics  . Alcohol use: Yes    Comment: occasional  glass of wine  . Drug use: Never   Current Outpatient Medications  Medication Sig Dispense Refill  . baclofen (LIORESAL) 20 MG tablet Take 1 tablet (20 mg total) by mouth 2 (two) times daily. 60 each 11  . buPROPion (WELLBUTRIN) 75 MG tablet Take 1 tablet (75 mg total) by mouth daily. 90 tablet 1  . Cyanocobalamin (VITAMIN B-12 PO) Place 1 mL under the tongue daily.    Marland Kitchen gabapentin (NEURONTIN) 300 MG capsule Take 2 capsules (600 mg total) by mouth 2 (two) times daily. 360 capsule 1  . metFORMIN (GLUCOPHAGE) 500 MG tablet Take 500 mg by mouth daily with breakfast.     . omeprazole (PRILOSEC) 20 MG capsule Take 1-2 capsules  (20-40 mg total) by mouth daily. (Patient taking differently: Take 20 mg by mouth daily. ) 90 capsule 1  . oxybutynin (DITROPAN) 5 MG tablet Take 5 mg by mouth daily.     . polyethylene glycol (MIRALAX / GLYCOLAX) packet Take 17 g by mouth daily as needed for moderate constipation.    . pravastatin (PRAVACHOL) 20 MG tablet Take 1 tablet (20 mg total) by mouth daily. 90 tablet 3  . vitamin C (ASCORBIC ACID) 500 MG tablet Take 1,000 mg by mouth daily.     Marland Kitchen dicyclomine (BENTYL) 10 MG capsule Take 1 capsule (10 mg total) by mouth every 8 (eight) hours as needed for spasms. 20 capsule 0   No current facility-administered medications for this visit.    Allergies  Allergen Reactions  . Sulfa Antibiotics Nausea And Vomiting     Review of Systems: All systems reviewed and negative except where noted in HPI.   Lab Results  Component Value Date   WBC 8.3 12/14/2017   HGB 11.7 12/14/2017   HCT 35.7 12/14/2017   MCV 83 12/14/2017   PLT 437 (H) 12/14/2017    Lab Results  Component Value Date   CREATININE 0.78 12/14/2017   BUN 19 12/14/2017   NA 141 12/14/2017   K 4.5 12/14/2017   CL 99 12/14/2017   CO2 23 12/14/2017    No results found for: ALT, AST, GGT, ALKPHOS, BILITOT   Physical Exam: BP 130/60 (BP Location: Left Arm, Patient Position: Sitting, Cuff Size: Normal)   Pulse 92  Constitutional: Pleasant,female in no acute distress, in wheelchair HEENT: Normocephalic and atraumatic. Conjunctivae are normal. No scleral icterus. Neck supple.  Cardiovascular: Normal rate, regular rhythm.  Pulmonary/chest: Effort normal and breath sounds normal. No wheezing, rales or rhonchi. Abdominal: Soft, nondistended, mild TTP in RLQ. There are no masses palpable. No hepatomegaly. Extremities: no edema Lymphadenopathy: No cervical adenopathy noted. Neurological: Alert and oriented to person place and time. Skin: Skin is warm and dry. No rashes noted. Psychiatric: Normal mood and affect.  Behavior is normal.   ASSESSMENT AND PLAN: 73 year old female here for reassessment of the following issues:  Right lower quadrant pain - as above, 4 episodes of severe pain over the past few months without clear precipitants. Appears too low for biliary colic although with her positioning in the wheelchair this is possible, but seems less likely. She has some mild tenderness on exam today. Discussed ddx with her, unclear what is causing her pain based on just history, could be bowel spasm however she does not think constipation causing her problems right now. Discussed options. Will send for basic labs today, has not had labs in a year, and recommend CT scan of the abdomen and pelvis with contrast to further evaluate initially,  exclude anything concerning or serious. As below, this will also give a gross evaluation of her colon to ensure no mass lesions. Will give a trial of bentyl to use in the interim in case pain recurs, will see if it helps. She agreed.  GERD - recommend lowest dose of omeprazole to control symptoms, she can use 20mg  to 40mg  as needed. No Barrett's on EGD.  Colon cancer screening / FH of colon cancer - discussed how aggressive she wishes to be with this. She will require hospitalization for assistance with bowel prep and colonoscopy. Given her age and comorbidities, we discussed risks / benefits of this. Her father had colon cancer. She wishes to await CT scan first, and will think about this and let me know how she wants to proceed.   Iatan Cellar, MD [facility]

## 2019-01-05 NOTE — Progress Notes (Unsigned)
error 

## 2019-01-06 ENCOUNTER — Encounter: Payer: Self-pay | Admitting: Family Medicine

## 2019-01-06 ENCOUNTER — Other Ambulatory Visit (INDEPENDENT_AMBULATORY_CARE_PROVIDER_SITE_OTHER): Payer: Medicare Other

## 2019-01-06 DIAGNOSIS — R1031 Right lower quadrant pain: Secondary | ICD-10-CM | POA: Diagnosis not present

## 2019-01-06 DIAGNOSIS — K219 Gastro-esophageal reflux disease without esophagitis: Secondary | ICD-10-CM

## 2019-01-06 DIAGNOSIS — Z800 Family history of malignant neoplasm of digestive organs: Secondary | ICD-10-CM | POA: Diagnosis not present

## 2019-01-06 LAB — COMPREHENSIVE METABOLIC PANEL
ALT: 14 U/L (ref 0–35)
AST: 14 U/L (ref 0–37)
Albumin: 3.6 g/dL (ref 3.5–5.2)
Alkaline Phosphatase: 116 U/L (ref 39–117)
BILIRUBIN TOTAL: 0.3 mg/dL (ref 0.2–1.2)
BUN: 18 mg/dL (ref 6–23)
CO2: 30 mEq/L (ref 19–32)
Calcium: 9.1 mg/dL (ref 8.4–10.5)
Chloride: 101 mEq/L (ref 96–112)
Creatinine, Ser: 0.89 mg/dL (ref 0.40–1.20)
GFR: 62 mL/min (ref >60.00)
Glucose, Bld: 193 mg/dL — ABNORMAL HIGH (ref 70–99)
POTASSIUM: 3.5 meq/L (ref 3.5–5.1)
Sodium: 139 mEq/L (ref 135–145)
Total Protein: 6.7 g/dL (ref 6.0–8.3)

## 2019-01-09 ENCOUNTER — Ambulatory Visit: Payer: Medicare Other | Admitting: Podiatry

## 2019-01-10 ENCOUNTER — Telehealth: Payer: Self-pay

## 2019-01-10 NOTE — Telephone Encounter (Signed)
Patient asks for a prescription of Ibuprofen 600 mg. She takes one daily. Brought the prescription with her when she moved from [place] and now needs refill.  Call back is [phone].  Danley Danker, RN [facility] RN)

## 2019-01-11 ENCOUNTER — Telehealth: Payer: Self-pay | Admitting: Gastroenterology

## 2019-01-11 MED ORDER — IBUPROFEN 600 MG PO TABS: 600.0000 mg | ORAL_TABLET | Freq: Every day | ORAL | 3 refills | Status: DC | PRN

## 2019-01-11 NOTE — Telephone Encounter (Signed)
rx sent in  Lucila Maine, DO PGY-3, [facility] 01/11/2019 7:12 PM

## 2019-01-11 NOTE — Telephone Encounter (Signed)
Thanks for the note. The patients labs are normal and reassuring. I recently saw her in clinic and she complained of pain ongoing for a few months. If her pain has resolved and she is feeling better we can hold off on the CT right now. She can call back to reschedule if it occurs. Thanks

## 2019-01-11 NOTE — Telephone Encounter (Signed)
Pt called wanting to speak with the nurse about the scan that she has. 01/12/2019

## 2019-01-11 NOTE — Telephone Encounter (Signed)
Please advise on her urgency of the Ct abdomen and pelvis with contrast using current COVID 19 guidelines. The patient has asked to wait on going for her imaging. States she is doing well and she is pain free.  Please advise the acceptable interval.

## 2019-01-12 ENCOUNTER — Inpatient Hospital Stay: Admission: RE | Admit: 2019-01-12 | Payer: Medicare Other | Source: Ambulatory Visit

## 2019-01-12 NOTE — Telephone Encounter (Signed)
Patient is advised and she states agreement with this plan.

## 2019-02-02 ENCOUNTER — Ambulatory Visit: Payer: Medicare Other | Admitting: Podiatry

## 2019-03-06 ENCOUNTER — Telehealth: Payer: Self-pay | Admitting: *Deleted

## 2019-03-06 ENCOUNTER — Telehealth: Payer: Self-pay

## 2019-03-06 ENCOUNTER — Other Ambulatory Visit: Payer: Self-pay

## 2019-03-06 DIAGNOSIS — R1031 Right lower quadrant pain: Secondary | ICD-10-CM

## 2019-03-06 NOTE — Telephone Encounter (Signed)
Letter mailed to patient asking her to call us when she wants to reschedule her CT

## 2019-03-06 NOTE — Progress Notes (Signed)
Because of COVID-19 we had cancelled your CT and are now able to reschedule some of these procedures. We understand per our telephone call that you are not comfortable yet, coming in for this procedure. Please give [place] a call at [phone] when you would like to reschedule this   Thank-you  [facility]

## 2019-03-06 NOTE — Telephone Encounter (Signed)
-----   Message from Marlon Pel, RN sent at 03/06/2019 12:03 PM EDT ----- Regarding: FW: Labs for CT She refused CT.  Way need to call her back and reschedule post Covid- [place] patient ----- Message ----- From: Osvaldo Shipper, NT Sent: 03/06/2019  11:38 AM EDT To: Marlon Pel, RN Subject: RE: Labs for CT                                Pt refused to come in at this time.   Erline Levine  ----- Message ----- From: Marlon Pel, RN Sent: 03/06/2019  11:08 AM EDT To: Osvaldo Shipper, NT Subject: RE: Labs for [place] send to me and I will send them where they go.  Sorry about [place].   Have a good week ----- Message ----- From: Osvaldo Shipper, NT Sent: 03/06/2019  10:37 AM EDT To: Marlon Pel, RN Subject: Labs for CT                                    Sherri   Good morning Sherri I am starting to work back through this list is it ok if I message you on each one? If not tell me how you would like for me to handle this?  Can you put in a BMET for PT.     Erline Levine

## 2019-03-06 NOTE — Telephone Encounter (Signed)
Called patient to reschedule CT Appt that was canceled due to COVID restrictions pt does not want to reschedule at this time she will call when she is ready. Pt was given direct number to call to reschedule [phone].

## 2019-03-08 ENCOUNTER — Ambulatory Visit: Payer: Medicare Other | Admitting: Podiatry

## 2019-03-27 ENCOUNTER — Telehealth: Payer: Self-pay | Admitting: *Deleted

## 2019-03-27 DIAGNOSIS — G35 Multiple sclerosis: Secondary | ICD-10-CM

## 2019-03-27 NOTE — Telephone Encounter (Signed)
Pt is requesting a script for a new air mattress.  She would like this mailed to her.  She states it has been 5 years since she received one and she is now due.  She wants a generic script stating air mattress at first and will call back if it needs to be more specific. Christen Bame, CMA

## 2019-03-29 NOTE — Telephone Encounter (Signed)
Placed in mail as requested.  Christen Bame, CMA

## 2019-03-29 NOTE — Telephone Encounter (Signed)
Printed rx  

## 2019-04-12 ENCOUNTER — Other Ambulatory Visit: Payer: Self-pay | Admitting: Neurology

## 2019-04-25 ENCOUNTER — Ambulatory Visit: Payer: Medicare Other | Admitting: Podiatry

## 2019-05-02 ENCOUNTER — Telehealth: Payer: Self-pay | Admitting: *Deleted

## 2019-05-02 NOTE — Telephone Encounter (Signed)
Claudia Walter at [facility] and left VM per pt request to verbally recertify home health order. Called pt and left VM that request had been completed. Let both parties know that if any forms need to be signed they can be faxed to [facility].

## 2019-05-02 NOTE — Telephone Encounter (Signed)
Call completed to [facility] at [facility]. Notified pt that request was completed.

## 2019-05-02 NOTE — Telephone Encounter (Signed)
Dorian Pod needs verbal order to re-certifiy patient for home health.  You can LM on her VM. Christen Bame, CMA

## 2019-05-18 ENCOUNTER — Other Ambulatory Visit: Payer: Self-pay

## 2019-05-19 MED ORDER — IBUPROFEN 600 MG PO TABS: 600.0000 mg | ORAL_TABLET | Freq: Every day | ORAL | 3 refills | Status: DC | PRN

## 2019-05-26 ENCOUNTER — Ambulatory Visit: Payer: Medicare Other | Admitting: Podiatry

## 2019-06-08 ENCOUNTER — Encounter (HOSPITAL_COMMUNITY): Payer: Self-pay

## 2019-06-08 ENCOUNTER — Inpatient Hospital Stay (HOSPITAL_COMMUNITY)
Admission: EM | Admit: 2019-06-08 | Discharge: 2019-06-13 | DRG: 871 | Disposition: A | Discharge status: Home Health | Payer: Medicare Other | Attending: Family Medicine | Admitting: Family Medicine

## 2019-06-08 ENCOUNTER — Emergency Department (HOSPITAL_COMMUNITY): Payer: Medicare Other

## 2019-06-08 ENCOUNTER — Other Ambulatory Visit: Payer: Self-pay

## 2019-06-08 ENCOUNTER — Observation Stay (HOSPITAL_COMMUNITY): Payer: Medicare Other

## 2019-06-08 DIAGNOSIS — A419 Sepsis, unspecified organism: Secondary | ICD-10-CM | POA: Diagnosis not present

## 2019-06-08 DIAGNOSIS — L8990 Pressure ulcer of unspecified site, unspecified stage: Secondary | ICD-10-CM | POA: Insufficient documentation

## 2019-06-08 DIAGNOSIS — R7881 Bacteremia: Secondary | ICD-10-CM

## 2019-06-08 DIAGNOSIS — R030 Elevated blood-pressure reading, without diagnosis of hypertension: Secondary | ICD-10-CM | POA: Diagnosis not present

## 2019-06-08 DIAGNOSIS — E860 Dehydration: Secondary | ICD-10-CM | POA: Diagnosis present

## 2019-06-08 DIAGNOSIS — F329 Major depressive disorder, single episode, unspecified: Secondary | ICD-10-CM | POA: Diagnosis present

## 2019-06-08 DIAGNOSIS — B9620 Unspecified Escherichia coli [E. coli] as the cause of diseases classified elsewhere: Secondary | ICD-10-CM

## 2019-06-08 DIAGNOSIS — A4151 Sepsis due to Escherichia coli [E. coli]: Principal | ICD-10-CM | POA: Diagnosis present

## 2019-06-08 DIAGNOSIS — G35 Multiple sclerosis: Secondary | ICD-10-CM | POA: Diagnosis present

## 2019-06-08 DIAGNOSIS — Z8249 Family history of ischemic heart disease and other diseases of the circulatory system: Secondary | ICD-10-CM

## 2019-06-08 DIAGNOSIS — Z20828 Contact with and (suspected) exposure to other viral communicable diseases: Secondary | ICD-10-CM | POA: Diagnosis present

## 2019-06-08 DIAGNOSIS — Z23 Encounter for immunization: Secondary | ICD-10-CM

## 2019-06-08 DIAGNOSIS — Z882 Allergy status to sulfonamides status: Secondary | ICD-10-CM

## 2019-06-08 DIAGNOSIS — R4182 Altered mental status, unspecified: Secondary | ICD-10-CM | POA: Diagnosis present

## 2019-06-08 DIAGNOSIS — R000 Tachycardia, unspecified: Secondary | ICD-10-CM | POA: Diagnosis present

## 2019-06-08 DIAGNOSIS — Z79899 Other long term (current) drug therapy: Secondary | ICD-10-CM

## 2019-06-08 DIAGNOSIS — N179 Acute kidney failure, unspecified: Secondary | ICD-10-CM | POA: Diagnosis present

## 2019-06-08 DIAGNOSIS — I959 Hypotension, unspecified: Secondary | ICD-10-CM | POA: Diagnosis not present

## 2019-06-08 DIAGNOSIS — N200 Calculus of kidney: Secondary | ICD-10-CM | POA: Diagnosis present

## 2019-06-08 DIAGNOSIS — Z833 Family history of diabetes mellitus: Secondary | ICD-10-CM

## 2019-06-08 DIAGNOSIS — E785 Hyperlipidemia, unspecified: Secondary | ICD-10-CM | POA: Diagnosis present

## 2019-06-08 DIAGNOSIS — N183 Chronic kidney disease, stage 3 unspecified: Secondary | ICD-10-CM

## 2019-06-08 DIAGNOSIS — N319 Neuromuscular dysfunction of bladder, unspecified: Secondary | ICD-10-CM | POA: Diagnosis present

## 2019-06-08 DIAGNOSIS — Z87891 Personal history of nicotine dependence: Secondary | ICD-10-CM

## 2019-06-08 DIAGNOSIS — E1110 Type 2 diabetes mellitus with ketoacidosis without coma: Secondary | ICD-10-CM

## 2019-06-08 DIAGNOSIS — K449 Diaphragmatic hernia without obstruction or gangrene: Secondary | ICD-10-CM | POA: Diagnosis present

## 2019-06-08 DIAGNOSIS — Z7984 Long term (current) use of oral hypoglycemic drugs: Secondary | ICD-10-CM

## 2019-06-08 DIAGNOSIS — E118 Type 2 diabetes mellitus with unspecified complications: Secondary | ICD-10-CM

## 2019-06-08 DIAGNOSIS — F419 Anxiety disorder, unspecified: Secondary | ICD-10-CM | POA: Diagnosis present

## 2019-06-08 DIAGNOSIS — Z8711 Personal history of peptic ulcer disease: Secondary | ICD-10-CM

## 2019-06-08 DIAGNOSIS — K589 Irritable bowel syndrome without diarrhea: Secondary | ICD-10-CM | POA: Diagnosis present

## 2019-06-08 DIAGNOSIS — N390 Urinary tract infection, site not specified: Secondary | ICD-10-CM | POA: Diagnosis present

## 2019-06-08 DIAGNOSIS — N3281 Overactive bladder: Secondary | ICD-10-CM | POA: Diagnosis present

## 2019-06-08 DIAGNOSIS — T83511A Infection and inflammatory reaction due to indwelling urethral catheter, initial encounter: Secondary | ICD-10-CM | POA: Diagnosis present

## 2019-06-08 DIAGNOSIS — N10 Acute tubulo-interstitial nephritis: Secondary | ICD-10-CM

## 2019-06-08 DIAGNOSIS — M810 Age-related osteoporosis without current pathological fracture: Secondary | ICD-10-CM | POA: Diagnosis present

## 2019-06-08 DIAGNOSIS — K219 Gastro-esophageal reflux disease without esophagitis: Secondary | ICD-10-CM | POA: Diagnosis present

## 2019-06-08 DIAGNOSIS — R0602 Shortness of breath: Secondary | ICD-10-CM

## 2019-06-08 DIAGNOSIS — Z800 Family history of malignant neoplasm of digestive organs: Secondary | ICD-10-CM

## 2019-06-08 DIAGNOSIS — R0682 Tachypnea, not elsewhere classified: Secondary | ICD-10-CM | POA: Diagnosis present

## 2019-06-08 LAB — COMPREHENSIVE METABOLIC PANEL
ALT: 27 U/L (ref 0–44)
AST: 37 U/L (ref 15–41)
Albumin: 2.8 g/dL — ABNORMAL LOW (ref 3.5–5.0)
Alkaline Phosphatase: 135 U/L — ABNORMAL HIGH (ref 38–126)
Anion gap: 18 — ABNORMAL HIGH (ref 5–15)
BUN: 32 mg/dL — ABNORMAL HIGH (ref 8–23)
CO2: 19 mmol/L — ABNORMAL LOW (ref 22–32)
Calcium: 8.6 mg/dL — ABNORMAL LOW (ref 8.9–10.3)
Chloride: 93 mmol/L — ABNORMAL LOW (ref 98–111)
Creatinine, Ser: 1.83 mg/dL — ABNORMAL HIGH (ref 0.44–1.00)
GFR calc Af Amer: 31 mL/min — ABNORMAL LOW (ref >60)
GFR calc non Af Amer: 27 mL/min — ABNORMAL LOW (ref >60)
Glucose, Bld: 702 mg/dL (ref 70–99)
Potassium: 4.7 mmol/L (ref 3.5–5.1)
Sodium: 130 mmol/L — ABNORMAL LOW (ref 135–145)
Total Bilirubin: 0.9 mg/dL (ref 0.3–1.2)
Total Protein: 6.5 g/dL (ref 6.5–8.1)

## 2019-06-08 LAB — URINALYSIS, ROUTINE W REFLEX MICROSCOPIC
Bilirubin Urine: NEGATIVE
Glucose, UA: >=500 mg/dL — AB
Ketones, ur: 80 mg/dL — AB
Nitrite: POSITIVE — AB
Protein, ur: 100 mg/dL — AB
Specific Gravity, Urine: 1.023 (ref 1.005–1.030)
pH: 5 (ref 5.0–8.0)

## 2019-06-08 LAB — POCT I-STAT EG7
Acid-base deficit: 8 mmol/L — ABNORMAL HIGH (ref 0.0–2.0)
Bicarbonate: 20.2 mmol/L (ref 20.0–28.0)
Calcium, Ion: 1.05 mmol/L — ABNORMAL LOW (ref 1.15–1.40)
HCT: 52 % — ABNORMAL HIGH (ref 36.0–46.0)
Hemoglobin: 17.7 g/dL — ABNORMAL HIGH (ref 12.0–15.0)
O2 Saturation: 60 %
Potassium: 4.7 mmol/L (ref 3.5–5.1)
Sodium: 130 mmol/L — ABNORMAL LOW (ref 135–145)
TCO2: 22 mmol/L (ref 22–32)
pCO2, Ven: 51.2 mmHg (ref 44.0–60.0)
pH, Ven: 7.204 — ABNORMAL LOW (ref 7.250–7.430)
pO2, Ven: 38 mmHg (ref 32.0–45.0)

## 2019-06-08 LAB — CBC WITH DIFFERENTIAL/PLATELET
Abs Immature Granulocytes: 0.32 10*3/uL — ABNORMAL HIGH (ref 0.00–0.07)
Basophils Absolute: 0.1 10*3/uL (ref 0.0–0.1)
Basophils Relative: 0 %
Eosinophils Absolute: 0 10*3/uL (ref 0.0–0.5)
Eosinophils Relative: 0 %
HCT: 42.9 % (ref 36.0–46.0)
Hemoglobin: 13.5 g/dL (ref 12.0–15.0)
Immature Granulocytes: 1 %
Lymphocytes Relative: 4 %
Lymphs Abs: 1.1 10*3/uL (ref 0.7–4.0)
MCH: 27.3 pg (ref 26.0–34.0)
MCHC: 31.5 g/dL (ref 30.0–36.0)
MCV: 86.8 fL (ref 80.0–100.0)
Monocytes Absolute: 0.7 10*3/uL (ref 0.1–1.0)
Monocytes Relative: 3 %
Neutro Abs: 22.9 10*3/uL — ABNORMAL HIGH (ref 1.7–7.7)
Neutrophils Relative %: 92 %
Platelets: 295 10*3/uL (ref 150–400)
RBC: 4.94 MIL/uL (ref 3.87–5.11)
RDW: 13.9 % (ref 11.5–15.5)
WBC Morphology: INCREASED
WBC: 25.1 10*3/uL — ABNORMAL HIGH (ref 4.0–10.5)
nRBC: 0 % (ref 0.0–0.2)

## 2019-06-08 LAB — SARS CORONAVIRUS 2 BY RT PCR (HOSPITAL ORDER, PERFORMED IN ~~LOC~~ HOSPITAL LAB): SARS Coronavirus 2: NEGATIVE

## 2019-06-08 LAB — CBG MONITORING, ED
Glucose-Capillary: >600 mg/dL (ref 70–99)
Glucose-Capillary: >600 mg/dL (ref 70–99)
Glucose-Capillary: 538 mg/dL (ref 70–99)

## 2019-06-08 LAB — LACTIC ACID, PLASMA
Lactic Acid, Venous: 2.8 mmol/L (ref 0.5–1.9)
Lactic Acid, Venous: 3.7 mmol/L (ref 0.5–1.9)

## 2019-06-08 LAB — APTT: aPTT: 24 seconds (ref 24–36)

## 2019-06-08 LAB — PROTIME-INR
INR: 1.2 (ref 0.8–1.2)
Prothrombin Time: 15.2 seconds (ref 11.4–15.2)

## 2019-06-08 MED ORDER — SODIUM CHLORIDE 0.9 % IV SOLN
INTRAVENOUS | Status: AC
  Administered 2019-06-09: 02:00:00 via INTRAVENOUS

## 2019-06-08 MED ORDER — SODIUM CHLORIDE 0.9 % IV SOLN: INTRAVENOUS | Status: DC

## 2019-06-08 MED ORDER — ACETAMINOPHEN 500 MG PO TABS
1000.0000 mg | ORAL_TABLET | Freq: Once | ORAL | Status: AC
  Administered 2019-06-08: 1000 mg via ORAL
  Filled 2019-06-08: qty 2

## 2019-06-08 MED ORDER — SODIUM CHLORIDE 0.9 % IV BOLUS
1000.0000 mL | Freq: Once | INTRAVENOUS | Status: AC
  Administered 2019-06-08: 1000 mL via INTRAVENOUS

## 2019-06-08 MED ORDER — INSULIN REGULAR(HUMAN) IN NACL 100-0.9 UT/100ML-% IV SOLN
INTRAVENOUS | Status: DC
  Administered 2019-06-08: 5.4 [IU]/h via INTRAVENOUS
  Administered 2019-06-09: 13.6 [IU]/h via INTRAVENOUS
  Filled 2019-06-08 (×2): qty 100

## 2019-06-08 MED ORDER — DEXTROSE-NACL 5-0.45 % IV SOLN
INTRAVENOUS | Status: DC
  Administered 2019-06-09: 05:00:00 via INTRAVENOUS
  Administered 2019-06-09: 1000 mL via INTRAVENOUS

## 2019-06-08 MED ORDER — DEXTROSE-NACL 5-0.45 % IV SOLN: INTRAVENOUS | Status: DC

## 2019-06-08 MED ORDER — ENOXAPARIN SODIUM 30 MG/0.3ML ~~LOC~~ SOLN
30.0000 mg | Freq: Every day | SUBCUTANEOUS | Status: DC
  Administered 2019-06-09 – 2019-06-12 (×4): 30 mg via SUBCUTANEOUS
  Filled 2019-06-08 (×5): qty 0.3

## 2019-06-08 MED ORDER — SODIUM CHLORIDE 0.9 % IV SOLN
1.0000 g | Freq: Once | INTRAVENOUS | Status: AC
  Administered 2019-06-08: 1 g via INTRAVENOUS
  Filled 2019-06-08: qty 10

## 2019-06-08 MED ORDER — POTASSIUM CHLORIDE 10 MEQ/100ML IV SOLN
10.0000 meq | INTRAVENOUS | Status: AC
  Filled 2019-06-08: qty 100

## 2019-06-08 NOTE — ED Notes (Signed)
ED TO INPATIENT HANDOFF REPORT  ED Nurse Name and Phone #: [phone]  S Name/Age/Gender Claudia Walter 73 y.o. female Room/Bed: 036C/036C  Code Status   Code Status: Not on file  Home/SNF/Other Home Patient oriented to: self, place, time and situation Is this baseline? Yes   Triage Complete: Triage complete  Chief Complaint AMS  Triage Note EMS reports that the pt has had AMS 1X day. Type 2 Dm controlled with metformin. cbg 540. Afebrile, 94%o2, 144/67   Allergies Allergies  Allergen Reactions  . Sulfa Antibiotics Nausea And Vomiting    Level of Care/Admitting Diagnosis ED Disposition    ED Disposition Condition [facility]: [place] [100100]  Level of Care: Progressive [102]  Covid Evaluation: Asymptomatic Screening Protocol (No Symptoms)  [facility]: [id number]  Admitting Physician: Matilde Haymaker [id number]  Attending Physician: Erin Hearing, MARSHALL L [[id number]]  PT Class (Do Not Modify): Observation [104]  PT Acc Code (Do Not Modify): Observation [10022]       B Medical/Surgery History Past Medical History:  Diagnosis Date  . Depression   . Diabetes mellitus without complication ([place])   . GERD (gastroesophageal reflux disease)   . Hyperlipemia   . Multiple sclerosis ([place])   . Osteoporosis    Past Surgical History:  Procedure Laterality Date  . CESAREAN SECTION N/A    X 2  . ESOPHAGOGASTRODUODENOSCOPY (EGD) WITH PROPOFOL N/A 04/12/2018   Procedure: ESOPHAGOGASTRODUODENOSCOPY (EGD) WITH PROPOFOL;  Surgeon: Yetta Flock, MD;  Location: [facility] ENDOSCOPY;  Service: Gastroenterology;  Laterality: N/A;  . HIP FRACTURE SURGERY Right      A IV Location/Drains/Wounds Patient Lines/Drains/Airways Status   Active Line/Drains/Airways    Name:   Placement date:   Placement time:   Site:   Days:   Peripheral IV 06/08/19 Anterior;Left Forearm   06/08/19    1857    Forearm   less than 1   Peripheral IV 06/08/19 Right  Forearm   06/08/19    2212    Forearm   less than 1          Intake/Output Last 24 hours  Intake/Output Summary (Last 24 hours) at 06/08/2019 2330 Last data filed at 06/08/2019 2212 Gross per 24 hour  Intake 1000 ml  Output -  Net 1000 ml    Labs/Imaging Results for orders placed or performed during the hospital encounter of 06/08/19 (from the past 48 hour(s))  Lactic acid, plasma     Status: Abnormal   Collection Time: 06/08/19  7:50 PM  Result Value Ref Range   Lactic Acid, Venous 2.8 (HH) 0.5 - 1.9 mmol/L    Comment: CRITICAL RESULT CALLED TO, READ BACK BY AND VERIFIED WITH: P.Dalinda Heidt RN 2036 06/08/2019 MCCORMICK K Performed at [facility], [place] [address], [place], [place] [address]   CBC with Differential     Status: Abnormal   Collection Time: 06/08/19  8:00 PM  Result Value Ref Range   WBC 25.1 (H) 4.0 - 10.5 K/uL   RBC 4.94 3.87 - 5.11 MIL/uL   Hemoglobin 13.5 12.0 - 15.0 g/dL   HCT 42.9 36.0 - 46.0 %   MCV 86.8 80.0 - 100.0 fL   MCH 27.3 26.0 - 34.0 pg   MCHC 31.5 30.0 - 36.0 g/dL   RDW 13.9 11.5 - 15.5 %   Platelets 295 150 - 400 K/uL   nRBC 0.0 0.0 - 0.2 %   Neutrophils Relative % 92 %  Neutro Abs 22.9 (H) 1.7 - 7.7 K/uL   Lymphocytes Relative 4 %   Lymphs Abs 1.1 0.7 - 4.0 K/uL   Monocytes Relative 3 %   Monocytes Absolute 0.7 0.1 - 1.0 K/uL   Eosinophils Relative 0 %   Eosinophils Absolute 0.0 0.0 - 0.5 K/uL   Basophils Relative 0 %   Basophils Absolute 0.1 0.0 - 0.1 K/uL   WBC Morphology INCREASED BANDS (>20% BANDS)     Comment: VACUOLATED NEUTROPHILS   Immature Granulocytes 1 %   Abs Immature Granulocytes 0.32 (H) 0.00 - 0.07 K/uL    Comment: Performed at [facility] [address] [place], [place] [address]  Comprehensive metabolic panel     Status: Abnormal   Collection Time: 06/08/19  8:00 PM  Result Value Ref Range   Sodium 130 (L) 135 - 145 mmol/L   Potassium 4.7 3.5 - 5.1 mmol/L   Chloride 93 (L) 98 - 111 mmol/L   CO2 19  (L) 22 - 32 mmol/L   Glucose, Bld 702 (HH) 70 - 99 mg/dL    Comment: CRITICAL RESULT CALLED TO, READ BACK BY AND VERIFIED WITH: P.Sherman Lipuma RN 2045 06/08/2019 MCCORMICK K    BUN 32 (H) 8 - 23 mg/dL   Creatinine, Ser 1.83 (H) 0.44 - 1.00 mg/dL   Calcium 8.6 (L) 8.9 - 10.3 mg/dL   Total Protein 6.5 6.5 - 8.1 g/dL   Albumin 2.8 (L) 3.5 - 5.0 g/dL   AST 37 15 - 41 U/L   ALT 27 0 - 44 U/L   Alkaline Phosphatase 135 (H) 38 - 126 U/L   Total Bilirubin 0.9 0.3 - 1.2 mg/dL   GFR calc non Af Amer 27 (L) >60 mL/min   GFR calc Af Amer 31 (L) >60 mL/min   Anion gap 18 (H) 5 - 15    Comment: Performed at [facility] [address] [place], [place] [address]  SARS Coronavirus 2 [facility] order, Performed in [facility] hospital lab) Nasopharyngeal Nasopharyngeal Swab     Status: None   Collection Time: 06/08/19  8:00 PM   Specimen: Nasopharyngeal Swab  Result Value Ref Range   SARS Coronavirus 2 NEGATIVE NEGATIVE    Comment: (NOTE) If result is NEGATIVE SARS-CoV-2 target nucleic acids are NOT DETECTED. The SARS-CoV-2 RNA is generally detectable in upper and lower  respiratory specimens during the acute phase of infection. The lowest  concentration of SARS-CoV-2 viral copies this assay can detect is 250  copies / mL. A negative result does not preclude SARS-CoV-2 infection  and should not be used as the sole basis for treatment or other  patient management decisions.  A negative result may occur with  improper specimen collection / handling, submission of specimen other  than nasopharyngeal swab, presence of viral mutation(s) within the  areas targeted by this assay, and inadequate number of viral copies  (<250 copies / mL). A negative result must be combined with clinical  observations, patient history, and epidemiological information. If result is POSITIVE SARS-CoV-2 target nucleic acids are DETECTED. The SARS-CoV-2 RNA is generally detectable in upper and lower  respiratory  specimens dur ing the acute phase of infection.  Positive  results are indicative of active infection with SARS-CoV-2.  Clinical  correlation with patient history and other diagnostic information is  necessary to determine patient infection status.  Positive results do  not rule out bacterial infection or co-infection with other viruses. If result is PRESUMPTIVE POSTIVE  SARS-CoV-2 nucleic acids MAY BE PRESENT.   A presumptive positive result was obtained on the submitted specimen  and confirmed on repeat testing.  While 2019 novel coronavirus  (SARS-CoV-2) nucleic acids may be present in the submitted sample  additional confirmatory testing may be necessary for epidemiological  and / or clinical management purposes  to differentiate between  SARS-CoV-2 and other Sarbecovirus currently known to infect humans.  If clinically indicated additional testing with an alternate test  methodology [id number]) is advised. The SARS-CoV-2 RNA is generally  detectable in upper and lower respiratory sp ecimens during the acute  phase of infection. The expected result is Negative. Fact Sheet for Patients:  StrictlyIdeas.no Fact Sheet for Healthcare Providers: BankingDealers.co.za This test is not yet approved or cleared by the [place] FDA and has been authorized for detection and/or diagnosis of SARS-CoV-2 by FDA under an Emergency Use Authorization (EUA).  This EUA will remain in effect (meaning this test can be used) for the duration of the COVID-19 declaration under Section 564(b)(1) of the Act, 21 U.S.C. section 360bbb-3(b)(1), unless the authorization is terminated or revoked sooner. Performed at [facility], [place] [address], [place], [place] [address]   CBG monitoring, ED     Status: Abnormal   Collection Time: 06/08/19  8:25 PM  Result Value Ref Range   Glucose-Capillary >600 (HH) 70 - 99 mg/dL  Urinalysis, Routine w reflex  microscopic     Status: Abnormal   Collection Time: 06/08/19  9:13 PM  Result Value Ref Range   Color, Urine YELLOW YELLOW   APPearance HAZY (A) CLEAR   Specific Gravity, Urine 1.023 1.005 - 1.030   pH 5.0 5.0 - 8.0   Glucose, UA >=500 (A) NEGATIVE mg/dL   Hgb urine dipstick LARGE (A) NEGATIVE   Bilirubin Urine NEGATIVE NEGATIVE   Ketones, ur 80 (A) NEGATIVE mg/dL   Protein, ur 100 (A) NEGATIVE mg/dL   Nitrite POSITIVE (A) NEGATIVE   Leukocytes,Ua SMALL (A) NEGATIVE   RBC / HPF 0-5 0 - 5 RBC/hpf   WBC, UA 21-50 0 - 5 WBC/hpf   Bacteria, UA MANY (A) NONE SEEN   Squamous Epithelial / LPF 0-5 0 - 5   Mucus PRESENT     Comment: Performed at [facility], [place] [address], [place], [place] [address]  APTT     Status: None   Collection Time: 06/08/19  9:45 PM  Result Value Ref Range   aPTT 24 24 - 36 seconds    Comment: Performed at [facility] [address], [place], [place] [address]  Protime-INR     Status: None   Collection Time: 06/08/19  9:45 PM  Result Value Ref Range   Prothrombin Time 15.2 11.4 - 15.2 seconds   INR 1.2 0.8 - 1.2    Comment: (NOTE) INR goal varies based on device and disease states. Performed at [facility], [place] [address], [place], [place] [address]   POCT I-Stat EG7     Status: Abnormal   Collection Time: 06/08/19  9:54 PM  Result Value Ref Range   pH, Ven 7.204 (L) 7.250 - 7.430   pCO2, Ven 51.2 44.0 - 60.0 mmHg   pO2, Ven 38.0 32.0 - 45.0 mmHg   Bicarbonate 20.2 20.0 - 28.0 mmol/L   TCO2 22 22 - 32 mmol/L   O2 Saturation 60.0 %   Acid-base deficit 8.0 (H) 0.0 - 2.0 mmol/L   Sodium 130 (L) 135 -  145 mmol/L   Potassium 4.7 3.5 - 5.1 mmol/L   Calcium, Ion 1.05 (L) 1.15 - 1.40 mmol/L   HCT 52.0 (H) 36.0 - 46.0 %   Hemoglobin 17.7 (H) 12.0 - 15.0 g/dL   Patient temperature HIDE    Sample type VENOUS    Comment NOTIFIED PHYSICIAN   CBG monitoring, ED     Status: Abnormal   Collection Time: 06/08/19 10:15 PM  Result Value Ref  Range   Glucose-Capillary >600 (HH) 70 - 99 mg/dL  Lactic acid, plasma     Status: Abnormal   Collection Time: 06/08/19 10:16 PM  Result Value Ref Range   Lactic Acid, Venous 3.7 (HH) 0.5 - 1.9 mmol/L    Comment: CRITICAL RESULT CALLED TO, READ BACK BY AND VERIFIED WITH: Wayden Schwertner P,RN 06/08/19 2244 WAYK Performed at [facility], [place] [address] [place], [place] [address]   CBG monitoring, ED     Status: Abnormal   Collection Time: 06/08/19 11:24 PM  Result Value Ref Range   Glucose-Capillary 538 (HH) 70 - 99 mg/dL   Comment 1 Document in Chart    Dg Chest Portable 1 View  Result Date: 06/08/2019 CLINICAL DATA:  Altered mental status EXAM: PORTABLE CHEST 1 VIEW COMPARISON:  None. FINDINGS: Linear atelectasis left base. Possible tiny left effusion. Normal heart size. Aortic atherosclerosis. No pneumothorax. IMPRESSION: Possible tiny left effusion with mild left basilar atelectasis Electronically Signed   By: Donavan Foil M.D.   On: 06/08/2019 20:21   Ct Renal Stone Study  Result Date: 06/08/2019 CLINICAL DATA:  73 year old female with altered mental status and flank pain. Concern for kidney stone. EXAM: CT ABDOMEN AND PELVIS WITHOUT CONTRAST TECHNIQUE: Multidetector CT imaging of the abdomen and pelvis was performed following the standard protocol without IV contrast. COMPARISON:  None. FINDINGS: Evaluation of this exam is limited in the absence of intravenous contrast. Lower chest: Minimal bibasilar dependent atelectatic changes. Trace bilateral pleural effusions with present. Atherosclerotic calcification of the mitral annulus noted. There is trace pericardial effusion. There is hypoattenuation of the cardiac blood pool suggestive of a degree of anemia. Clinical correlation is recommended. No intra-abdominal free air or free fluid. Hepatobiliary: There is diffuse fatty infiltration of the liver. No intrahepatic biliary ductal dilatation. An ill-defined 10 mm hypodense focus in the  central liver is not characterized but may represent a cyst. No calcified gallstone or pericholecystic fluid. Pancreas: Unremarkable. No pancreatic ductal dilatation or surrounding inflammatory changes. Spleen: Normal in size without focal abnormality. Adrenals/Urinary Tract: The adrenal glands are unremarkable. There is a large right renal upper pole nonobstructing calculus measuring approximately 15 mm. Several additional nonobstructing calculi noted in the right kidney. There is no hydronephrosis. There is mild right perinephric stranding. Correlation with urinalysis recommended to exclude UTI. There is no hydronephrosis or nephrolithiasis on the left. Subcentimeter exophytic left renal interpolar hypodense lesion is not characterized on this CT. The visualized ureters are unremarkable. The urinary bladder is decompressed around a suprapubic catheter. Stomach/Bowel: There is a small hiatal hernia. There is moderate stool throughout the colon. No bowel obstruction or active inflammation. The appendix is unremarkable as visualized. Vascular/Lymphatic: Mild atherosclerotic calcification of the aorta. The IVC is unremarkable. No portal venous gas. There is no adenopathy. Reproductive: The uterus and ovaries are grossly unremarkable. Calcification along the right adnexal vascular pedicle noted. Other: There is diffuse subcutaneous edema. Diffuse fatty atrophy of the abdominal and pelvic musculature. Musculoskeletal: Absent right femoral head with chronic deformity and heterotopic  bone formation in the right hip. Severe left hip osteoarthritis. Advanced osteopenia. Multilevel age indeterminate, old-appearing compression fractures involving L1, L3, L5. No retropulsed fragment. No definite acute fracture. IMPRESSION: 1. Nonobstructing right renal calculi. No hydronephrosis or obstructing stone. Correlation with urinalysis recommended to exclude UTI. 2. Fatty liver. 3. Moderate colonic stool burden. No bowel obstruction  or active inflammation. Normal appendix. 4. Aortic Atherosclerosis (ICD10-I70.0). Electronically Signed   By: Anner Crete M.D.   On: 06/08/2019 23:02    Pending Labs Unresulted Labs (From admission, onward)    Start     Ordered   06/08/19 2044  Blood Culture (routine x 2)  BLOOD CULTURE X 2,   STAT     06/08/19 2044   06/08/19 2044  Urine culture  ONCE - STAT,   STAT     06/08/19 2044   06/08/19 2032  Blood gas, venous  Once,   STAT     06/08/19 2031          Vitals/Pain Today's Vitals   06/08/19 2115 06/08/19 2124 06/08/19 2152 06/08/19 2300  BP: 140/70  (!) 152/51 (!) 134/55  Pulse:  (!) 109 (!) 112 (!) 113  Resp: (!) 22 20 (!) 24 (!) 26  Temp:      TempSrc:      SpO2:  100% 98% 98%  PainSc:        Isolation Precautions No active isolations  Medications Medications  dextrose 5 %-0.45 % sodium chloride infusion (has no administration in time range)  insulin regular, human (MYXREDLIN) 100 units/ 100 mL infusion (14.3 Units/hr Intravenous Rate/Dose Change 06/08/19 2326)  sodium chloride 0.9 % bolus 1,000 mL (0 mLs Intravenous Stopped 06/08/19 2212)    And  sodium chloride 0.9 % bolus 1,000 mL (1,000 mLs Intravenous New Bag/Given 06/08/19 2221)    And  0.9 %  sodium chloride infusion (has no administration in time range)  cefTRIAXone (ROCEPHIN) 1 g in sodium chloride 0.9 % 100 mL IVPB (0 g Intravenous Stopped 06/08/19 2326)  acetaminophen (TYLENOL) tablet 1,000 mg (1,000 mg Oral Given 06/08/19 2219)    Mobility non-ambulatory High fall risk   Focused Assessments    R Recommendations: See Admitting Provider Note  Report given to:   Additional Notes:

## 2019-06-08 NOTE — H&P (Addendum)
[facility] Admission History and Physical Service Pager: [phone]  Patient name: Claudia Walter Medical record number: [id number] Date of birth: Feb 24, 1946 Age: 73 y.o. Gender: female  Primary Care Provider: Gladys Damme, MD Consultants: Urology Code Status: Full Preferred Emergency Contact: Son, Wynonia Lawman Lizak:   Chief Complaint: Sepsis, AMS, DKA.  Assessment and Plan: Claudia Walter is a 73 y.o. female presenting with sepsis, altered mental status and DKA . PMH is significant for neurogenic bladder, diabetes, multiple sclerosis, overactive bladder, IBS and HLD.  #Sepsis 2/2 to UTI Patient was admitted with 1 to 2 day history of AMS with vomiting, fatigue.  Admission vitals were notable for tachypnea to 24, tachycardia to 115, febrile to 101.6.  Physical exam was remarkable for A&O x2, hypovolemia, mild abdominal tenderness to palpation on right lower quadrant and left lower quadrant.  WBC 25, UA Glucose> 500, positive nitrites, large Hb and small leuks. Urine microanalysis showed many bacteria and WBC present. CT abdomen showed large right renal upper pole nonobstructing calculus measuring 15 mm with additional additional nonobstructing calculi noted in the right kidney.  Patient has suprapubic catheter in situ which is likely the source of urosepsis. Patient tender in  and was reporting abdominal pain to family. Renal calculi likely also contributing to urosepsis. Considered pulmonary cause of sepsis as patient has had recent coughing episodes and there was concern of aspiration pneumonia as patient's family had been feeding patient while lying flat. However CXR does not show evidence of pneumonia, no productive cough and chest clear on examination with good air entry throughout lung fields. No evidence of wounds as source of sepsis. -Admit to progressive, Dr. Erin Hearing -Telemetry, continuous pulse ox -Strict I's and O's -Up with assistance -S/p 2 L normal saline, will  administer additional 2 L -Trend LA until below 2 -Continue Ceftriaxone -Urine cultures, blood cultures -Tylenol if spikes temperature -Contact Urology a.m. regarding placement of suprapubic catheter and renal stone. -PT, OT -CSW  #DKA  Last Hb1AC 6.5 in 7/19. Takes Metformin 500mg  once daily.  Per chart review, she does not seem to have experienced significant issues from diabetes previously and had moderately controlled blood sugars in the past.  This appears to be her first hospitalization related to poorly controlled diabetes. In the ED CBG>600, pH 7.2, anion gap 18, ketonuria. Started on insulin drip and given 4 L normal saline for hydration, CBGs 702> 538>450>401>355 overnight. 10 mEq K X 4 given as K 3.3.  Lab work-up so far is consistent with diabetes, acidosis, ketosis consistent with a diagnosis of diabetic ketoacidosis.  Though she does have an altered mental status, her blood sugars not consistent with this generally seen in HHS.  Her poorly controlled blood sugars are certainly contributing to the below mentioned problem of altered mental status. -Hold Metformin  -NPO  -Continue insulin drip -Monitor CBGs every hour -Monitor BMP every 4 hours -Transition to D5 normal saline once CBG 200-250 -Check K with BMP and replete PRN -Transition to subcu insulin and feed patient p.o. when anion gap closes  #AMS  Likely secondary to sepsis and DKA. Stroke considered some possible slurring of speech and recent change in mental status, however reassuring that patient's neurological deficit on examination are chronic secondary to MS. No facial droop, change in vision or new limb weakness/numbness. UDA pending. Withdrawing of medications unlikely cause of AMS. -Delirium precautions -Monitor infection and DKA, AMS will likely resolve  -UDA -Consider CT head if AMS does not resolve  #AKI Cr  1.7 on admission, baseline 0.9. BUN 32. Likely prerenal cause secondary to dehydration -Continue IV  fluids -Continue to monitor with BMP  #Lactic acidosis 2.8> 3.7> 3.9.  Elevated due to sepsis and DKA.  Expected improvement with sufficient fluid resuscitation and antibiotics.  We will continue to monitor through the night. -Continue to hydrate with aggressive IV fluids as LA worsening -Trend LA until under 2  #Pseudohyponatremia Na 130, corrected Na 140.  Secondary to hyperglycemia. -Continue to monitor with BMP  #Multiple sclerosis Diagnosed 9 years ago, patient has been doing well post diagnosis according to son. 5/5 strength upper extremity, 1/5 strength lower extremity-able to move toes slightly bilaterally, normal sensation of upper and lower extremities.  Fasciculations noted in right calf.  Additional problem of lower limbs includes reduced mobility secondary to complication in right hip surgery many years ago Takes baclofen 20mg  BID, gabapentin 600mg  BID -Hold is n.p.o.  #Neurogenic bladder Takes oxybutynin 5mg  once daily -Hold as n.p.o.  #HLD Last lipid panel: 2019,TAGs 249, LDL 159, HDL 41, Chol 250, VLDL 50 -Pravastatin 20mg   -Hold as n.p.o.  #IBS Takes dicyclomine 10 mg Q8PRN  -Hold as n.p.o.  #GERD/hiatus hernia Takes omeprazole 20mg  once daily -Hold as n.p.o.  #Nutrition  Takes vitamin C 100 mg, cyanocobalamin 75mg  once daily  -Hold as n.p.o.  #Tobacco misuse disorder Takes Bupropion 75 mg once daily -Hold as n.p.o.  #Inadequate social support Patient lives with son and his wife, who are very caring and supportive.  Have the best interest for the patient.  However patient may benefit from additional help at home in addition to family support.  Family reported that they were feeding patient was lying supine, increased risk of aspiration and perhaps have poor understanding of her chronic conditions and how best to support her.  On multiple occasions during our phone conversation, the son mentioned that she had had similar presentations before and she only  required intravenous vitamin C in order to get better and come home. -Consult to clinical social work  FEN/GI: N.p.o. Prophylaxis: Lovenox  Disposition: 2 to 3 days of hospitalization anticipated  History of Present Illness:  Prestina Walter is a 73 y.o. female presenting with altered mental status.   Patient unable to provide history due to level 5 caveat.  Telephone call to son who provided history of the patient.  Reports she has been lethargic, sleepy and "slow" for the last 1 to 2 days.  This is far from her baseline as she is usually able to hold a conversation and is usually alert and orientated. Son says she gets these symptoms when she has a UTI and that she usually gets admitted for this once a year and the doctors give " IV vitamin C" which improves her symptoms. She also had chills, and vomiting-food contents yesterday, no blood in vomit.  Son reports that she felt warm but did not take a temperature.  Also reports possible slurring of speech 1 day ago but no facial drooping or limb weakness.  Denies chest pain or shortness of breath.  Did notice patient having nonproductive cough x3 episodes over the last 2 days.  Son reports trying to feed patient while she was sleeping.  She has longstanding no logical deficit of lower limbs secondary to MS. Minimal movement and is unable to move the toes very slightly.   In the ED, glucose 702, pH 7.2, ketonuria, anion gap 18, Na 130, K4.7, Cr 1.83, BUN 32, ALP 135, Alb 2.8, WBC 25,  Hb 17.7, LA 3.7.  UA: Glucose> 500, positive nitrites, large Hb and small leuks.  Urine microanalysis shows many bacteria and WBC present.  CXR showed possible tiny left effusion with mild left basilar atelectasis. CT KUB showed nonobstructing right renal calculus in right renal upper pole, several additional nonobstructing calculi notedin the right kidney.  no hydronephrosis or obstructing stone. Patient was tachypneac, tachycardic and temp 101. Patient was treated as urosepsis  and DKA with IV fluids, IV ceftriaxone and and insulin drip.   Review Of Systems: Per HPI with the following additions:    Review of Systems  Constitutional: Negative for chills.  Respiratory: Positive for cough and wheezing. Negative for shortness of breath.   Cardiovascular: Negative for chest pain and palpitations.  Gastrointestinal: Positive for abdominal pain, nausea and vomiting. Negative for blood in stool, constipation, diarrhea and melena.  Neurological: Negative for speech change, focal weakness and headaches.    Patient Active Problem List   Diagnosis Date Noted  . Sepsis ([place]) 06/08/2019  . Abnormality of gait 11/16/2018  . Inadequate social support 09/06/2018  . Needs assistance with community resources 09/05/2018  . Gastroesophageal reflux disease   . Gastric ulcer without hemorrhage or perforation   . Hiatal hernia 12/15/2017  . Anemia 12/15/2017  . Multiple sclerosis ([place]) 12/14/2017  . Neurogenic bladder 12/14/2017  . Suprapubic catheter ([place]) 12/14/2017  . Osteoporosis 12/14/2017  . Type 2 diabetes mellitus ([place]) 12/14/2017  . Hyperlipidemia 12/14/2017    Past Medical History: Past Medical History:  Diagnosis Date  . Depression   . Diabetes mellitus without complication ([place])   . GERD (gastroesophageal reflux disease)   . Hyperlipemia   . Multiple sclerosis ([place])   . Osteoporosis     Past Surgical History: Past Surgical History:  Procedure Laterality Date  . CESAREAN SECTION N/A    X 2  . ESOPHAGOGASTRODUODENOSCOPY (EGD) WITH PROPOFOL N/A 04/12/2018   Procedure: ESOPHAGOGASTRODUODENOSCOPY (EGD) WITH PROPOFOL;  Surgeon: Yetta Flock, MD;  Location: [facility] ENDOSCOPY;  Service: Gastroenterology;  Laterality: N/A;  . HIP FRACTURE SURGERY Right     Social History: Social History   Tobacco Use  . Smoking status: Former Smoker    Types: Cigarettes    Quit date: 1978    Years since quitting: 42.6  . Smokeless tobacco: Never Used  Substance  Use Topics  . Alcohol use: Yes    Comment: occasional glass of wine  . Drug use: Never   Additional social history: lives with son and daughter in law. Mobilises in [employer]. Please also refer to relevant sections of EMR.  Family History: Family History  Problem Relation Age of Onset  . Diabetes Mother   . Heart attack Mother   . Colon cancer Father    (If not completed, MUST add something in)  Allergies and Medications: Allergies  Allergen Reactions  . Sulfa Antibiotics Nausea And Vomiting   No current facility-administered medications on file prior to encounter.    Current Outpatient Medications on File Prior to Encounter  Medication Sig Dispense Refill  . baclofen (LIORESAL) 20 MG tablet TAKE 1 TABLET(20 MG) BY MOUTH TWICE DAILY 60 tablet 11  . buPROPion (WELLBUTRIN) 75 MG tablet Take 1 tablet (75 mg total) by mouth daily. 90 tablet 1  . Cyanocobalamin (VITAMIN B-12 PO) Place 1 mL under the tongue daily.    Marland Kitchen dicyclomine (BENTYL) 10 MG capsule Take 1 capsule (10 mg total) by mouth every 8 (eight) hours as needed for spasms. [place]  capsule 0  . gabapentin (NEURONTIN) 300 MG capsule Take 2 capsules (600 mg total) by mouth 2 (two) times daily. 360 capsule 1  . ibuprofen (ADVIL) 600 MG tablet Take 1 tablet (600 mg total) by mouth daily as needed. 30 tablet 3  . metFORMIN (GLUCOPHAGE) 500 MG tablet Take 500 mg by mouth daily with breakfast.     . omeprazole (PRILOSEC) 20 MG capsule Take 1-2 capsules (20-40 mg total) by mouth daily. (Patient taking differently: Take 20 mg by mouth daily. ) 90 capsule 1  . oxybutynin (DITROPAN) 5 MG tablet Take 5 mg by mouth daily.     . polyethylene glycol (MIRALAX / GLYCOLAX) packet Take 17 g by mouth daily as needed for moderate constipation.    . pravastatin (PRAVACHOL) 20 MG tablet Take 1 tablet (20 mg total) by mouth daily. 90 tablet 3  . vitamin C (ASCORBIC ACID) 500 MG tablet Take 1,000 mg by mouth daily.       Objective: BP (!) 152/51    Pulse (!) 112   Temp (!) 101.6 F (38.7 C) (Rectal)   Resp (!) 24   SpO2 98%  Exam: General: Ill-looking, somnolent but able to arouse, no acute distress. ANO X 2 Eyes: Normal extraocular eye movements, no scleral icterus, normal conjunctiva and conjunctiva ENTM: Dry mucous membranes, upper dentures, no pharyngeal erythema or exudates Neck: Supple, nontender, no thyromegaly, cysts or lymphadenopathy Cardiovascular: S1 and S2 present, no S3 or S4, no rubs or gallops Respiratory: Chest clear, no crackles no wheeze, no respiratory distress Gastrointestinal: Abdomen distended, tender in right iliac fossa, no guarding, bowel sounds present MSK: 5 out of 5 strength upper extremity, able to move toes bilaterally, sensation upper and lower extremities.  Fasciculations in right calf noted Derm: No rashes, ecchymosis Neuro: Cranial nerves I to XII intact Psych: Normal mood, normal affect  Labs and Imaging: CBC BMET  Recent Labs  Lab 06/08/19 2000 06/08/19 2154  WBC 25.1*  --   HGB 13.5 17.7*  HCT 42.9 52.0*  PLT 295  --    Recent Labs  Lab 06/08/19 2000 06/08/19 2154  NA 130* 130*  K 4.7 4.7  CL 93*  --   CO2 19*  --   BUN 32*  --   CREATININE 1.83*  --   GLUCOSE 702*  --   CALCIUM 8.6*  --      EKG: No prior exam.  Q waves noted in leads III and aVF.  Sinus tachycardia.  Dg Chest Portable 1 View  Result Date: 06/08/2019 CLINICAL DATA:  Altered mental status EXAM: PORTABLE CHEST 1 VIEW COMPARISON:  None. FINDINGS: Linear atelectasis left base. Possible tiny left effusion. Normal heart size. Aortic atherosclerosis. No pneumothorax. IMPRESSION: Possible tiny left effusion with mild left basilar atelectasis Electronically Signed   By: Donavan Foil M.D.   On: 06/08/2019 20:21   Ct Renal Stone Study  Result Date: 06/08/2019 CLINICAL DATA:  73 year old female with altered mental status and flank pain. Concern for kidney stone. EXAM: CT ABDOMEN AND PELVIS WITHOUT CONTRAST  TECHNIQUE: Multidetector CT imaging of the abdomen and pelvis was performed following the standard protocol without IV contrast. COMPARISON:  None. FINDINGS: Evaluation of this exam is limited in the absence of intravenous contrast. Lower chest: Minimal bibasilar dependent atelectatic changes. Trace bilateral pleural effusions with present. Atherosclerotic calcification of the mitral annulus noted. There is trace pericardial effusion. There is hypoattenuation of the cardiac blood pool suggestive of a degree of anemia. Clinical  correlation is recommended. No intra-abdominal free air or free fluid. Hepatobiliary: There is diffuse fatty infiltration of the liver. No intrahepatic biliary ductal dilatation. An ill-defined 10 mm hypodense focus in the central liver is not characterized but may represent a cyst. No calcified gallstone or pericholecystic fluid. Pancreas: Unremarkable. No pancreatic ductal dilatation or surrounding inflammatory changes. Spleen: Normal in size without focal abnormality. Adrenals/Urinary Tract: The adrenal glands are unremarkable. There is a large right renal upper pole nonobstructing calculus measuring approximately 15 mm. Several additional nonobstructing calculi noted in the right kidney. There is no hydronephrosis. There is mild right perinephric stranding. Correlation with urinalysis recommended to exclude UTI. There is no hydronephrosis or nephrolithiasis on the left. Subcentimeter exophytic left renal interpolar hypodense lesion is not characterized on this CT. The visualized ureters are unremarkable. The urinary bladder is decompressed around a suprapubic catheter. Stomach/Bowel: There is a small hiatal hernia. There is moderate stool throughout the colon. No bowel obstruction or active inflammation. The appendix is unremarkable as visualized. Vascular/Lymphatic: Mild atherosclerotic calcification of the aorta. The IVC is unremarkable. No portal venous gas. There is no adenopathy.  Reproductive: The uterus and ovaries are grossly unremarkable. Calcification along the right adnexal vascular pedicle noted. Other: There is diffuse subcutaneous edema. Diffuse fatty atrophy of the abdominal and pelvic musculature. Musculoskeletal: Absent right femoral head with chronic deformity and heterotopic bone formation in the right hip. Severe left hip osteoarthritis. Advanced osteopenia. Multilevel age indeterminate, old-appearing compression fractures involving L1, L3, L5. No retropulsed fragment. No definite acute fracture. IMPRESSION: 1. Nonobstructing right renal calculi. No hydronephrosis or obstructing stone. Correlation with urinalysis recommended to exclude UTI. 2. Fatty liver. 3. Moderate colonic stool burden. No bowel obstruction or active inflammation. Normal appendix. 4. Aortic Atherosclerosis (ICD10-I70.0). Electronically Signed   By: Anner Crete M.D.   On: 06/08/2019 23:02    Lattie Haw, MD 06/08/2019, 10:15 PM PGY-1, [place] Intern pager: [phone], text pages welcome  FPTS Upper-Level Resident Addendum   I have independently interviewed and examined the patient. I have discussed the above with the original author and agree with their documentation. My edits for correction/addition/clarification are in blue. Please see also any attending notes.    Matilde Haymaker MD PGY-2, [place] [facility] 06/09/2019 5:58 AM  FPTS Service pager: [phone] (text pages welcome through [employer])

## 2019-06-08 NOTE — ED Provider Notes (Signed)
[facility] EMERGENCY DEPARTMENT Provider Note   CSN: [id number] Arrival date & time: 06/08/19  1851     History   Chief Complaint Chief Complaint  Patient presents with  . Altered Mental Status    HPI Claudia Walter is a 73 y.o. female.     The history is provided by the patient, a relative and medical records. No language interpreter was used.     73 year old female with history of non-insulin-dependent diabetes, multiple sclerosis, neurogenic bladder with suprapubic catheter, depression brought here via EMS from home for evaluation of altered mental status.  History obtained through patient and through daughter-in-law who is at bedside.  Since yesterday, patient has decrease in appetite, felt nauseous, vomited once, and complained of some abdominal discomfort.  Today she does not appear her normal self, answer questions inappropriately and appears to be more confused than usual.  She has history of MS and normally requires assistance to use a wheelchair but she is mostly bedbound.  She has a suprapubic catheter but currently put out urine as normal.  She did have some nonproductive cough today.  No fever but endorses chills.  Family unable to check her blood sugar at home due to not having the appropriate equipment.  No recent sick contact.  She denies any chest pain or shortness of breath.  She denies any significant headache aside from some mild headache yesterday which is since resolved.  She does complain of abdominal discomfort yesterday but none today.  History is limited as patient was having a difficulty answering questions.  Past Medical History:  Diagnosis Date  . Depression   . Diabetes mellitus without complication ([place])   . GERD (gastroesophageal reflux disease)   . Hyperlipemia   . Multiple sclerosis ([place])   . Osteoporosis     Patient Active Problem List   Diagnosis Date Noted  . Abnormality of gait 11/16/2018  . Inadequate social support  09/06/2018  . Needs assistance with community resources 09/05/2018  . Gastroesophageal reflux disease   . Gastric ulcer without hemorrhage or perforation   . Hiatal hernia 12/15/2017  . Anemia 12/15/2017  . Multiple sclerosis ([place]) 12/14/2017  . Neurogenic bladder 12/14/2017  . Suprapubic catheter ([place]) 12/14/2017  . Osteoporosis 12/14/2017  . Type 2 diabetes mellitus ([place]) 12/14/2017  . Hyperlipidemia 12/14/2017    Past Surgical History:  Procedure Laterality Date  . CESAREAN SECTION N/A    X 2  . ESOPHAGOGASTRODUODENOSCOPY (EGD) WITH PROPOFOL N/A 04/12/2018   Procedure: ESOPHAGOGASTRODUODENOSCOPY (EGD) WITH PROPOFOL;  Surgeon: Yetta Flock, MD;  Location: [facility] END[facility]COPY;  Service: Gastroenterology;  Laterality: N/A;  . HIP FRACTURE SURGERY Right      OB History   No obstetric history on file.      Home Medications    Prior to Admission medications   Medication Sig Start Date End Date Taking? Authorizing Provider  baclofen (LIORESAL) 20 MG tablet TAKE 1 TABLET(20 MG) BY MOUTH TWICE DAILY 04/12/19   Marcial Pacas, MD  buPROPion (WELLBUTRIN) 75 MG tablet Take 1 tablet (75 mg total) by mouth daily. 11/16/18   Dennie Bible, NP  Cyanocobalamin (VITAMIN B-12 PO) Place 1 mL under the tongue daily.    [provider]  dicyclomine (BENTYL) 10 MG capsule Take 1 capsule (10 mg total) by mouth every 8 (eight) hours as needed for spasms. 01/03/19   Armbruster, Carlota Raspberry, MD  gabapentin (NEURONTIN) 300 MG capsule Take 2 capsules (600 mg total) by mouth 2 (two)  times daily. 11/16/18   Dennie Bible, NP  ibuprofen (ADVIL) 600 MG tablet Take 1 tablet (600 mg total) by mouth daily as needed. 05/19/19   Gladys Damme, MD  metFORMIN (GLUCOPHAGE) 500 MG tablet Take 500 mg by mouth daily with breakfast.     [provider]  omeprazole (PRILOSEC) 20 MG capsule Take 1-2 capsules (20-40 mg total) by mouth daily. Patient taking differently: Take 20 mg by mouth daily.   01/31/18   Armbruster, Carlota Raspberry, MD  oxybutynin (DITROPAN) 5 MG tablet Take 5 mg by mouth daily.     [provider]  polyethylene glycol (MIRALAX / GLYCOLAX) packet Take 17 g by mouth daily as needed for moderate constipation.    [provider]  pravastatin (PRAVACHOL) 20 MG tablet Take 1 tablet (20 mg total) by mouth daily. 01/14/18   Diallo, Earna Coder, MD  vitamin C (ASCORBIC ACID) 500 MG tablet Take 1,000 mg by mouth daily.     [provider]    Family History Family History  Problem Relation Age of Onset  . Diabetes Mother   . Heart attack Mother   . Colon cancer Father     Social History Social History   Tobacco Use  . Smoking status: Former Smoker    Types: Cigarettes    Quit date: 1978    Years since quitting: 42.6  . Smokeless tobacco: Never Used  Substance Use Topics  . Alcohol use: Yes    Comment: occasional glass of wine  . Drug use: Never     Allergies   Sulfa antibiotics   Review of Systems Review of Systems  Unable to perform ROS: Mental status change     Physical Exam Updated Vital Signs BP 126/61   Pulse (!) 115   Resp (!) 22   SpO2 92%   Physical Exam Vitals signs and nursing note reviewed.  Constitutional:      General: She is not in acute distress.    Appearance: She is well-developed. She is obese.     Comments: Nontoxic in appearance  HENT:     Head: Atraumatic.  Eyes:     Conjunctiva/sclera: Conjunctivae normal.  Neck:     Musculoskeletal: Neck supple. No neck rigidity.  Cardiovascular:     Rate and Rhythm: Tachycardia present.  Pulmonary:     Effort: Pulmonary effort is normal.     Breath sounds: Normal breath sounds.  Abdominal:     Palpations: Abdomen is soft.     Tenderness: There is abdominal tenderness (Tenderness to lower abdomen on palpation without guarding or rebound tenderness.  Suprapubic catheter is in place without signs of infection to the stoma.).  Musculoskeletal:     Comments:  Global weakness, likely chronic.  Skin:    Findings: No rash.  Neurological:     Mental Status: She is alert.     Comments: Alert to self place and time but not oriented to situation.      ED Treatments / Results  Labs (all labs ordered are listed, but only abnormal results are displayed) Labs Reviewed  CBC WITH DIFFERENTIAL/PLATELET - Abnormal; Notable for the following components:      Result Value   WBC 25.1 (*)    All other components within normal limits  LACTIC ACID, PLASMA - Abnormal; Notable for the following components:   Lactic Acid, Venous 2.8 (*)    All other components within normal limits  CBG MONITORING, ED - Abnormal; Notable for the following components:  Glucose-Capillary >600 (*)    All other components within normal limits  SARS CORONAVIRUS 2 (HOSPITAL ORDER, [facility])  CULTURE, BLOOD (ROUTINE X 2)  CULTURE, BLOOD (ROUTINE X 2)  URINE CULTURE  COMPREHENSIVE METABOLIC PANEL  URINALYSIS, ROUTINE W REFLEX MICROSCOPIC  LACTIC ACID, PLASMA  BLOOD GAS, VENOUS  APTT  PROTIME-INR    EKG None  Radiology Dg Chest Portable 1 View  Result Date: 06/08/2019 CLINICAL DATA:  Altered mental status EXAM: PORTABLE CHEST 1 VIEW COMPARISON:  None. FINDINGS: Linear atelectasis left base. Possible tiny left effusion. Normal heart size. Aortic atherosclerosis. No pneumothorax. IMPRESSION: Possible tiny left effusion with mild left basilar atelectasis Electronically Signed   By: Donavan Foil M.D.   On: 06/08/2019 20:21    Procedures .Critical Care Performed by: Domenic Moras, PA-C Authorized by: Domenic Moras, PA-C   Critical care provider statement:    Critical care time (minutes):  45   Critical care was time spent personally by me on the following activities:  Discussions with consultants, evaluation of patient's response to treatment, examination of patient, ordering and performing treatments and interventions, ordering and review of  laboratory studies, ordering and review of radiographic studies, pulse oximetry, re-evaluation of patient's condition, obtaining history from patient or surrogate and review of old charts   (including critical care time)  Medications Ordered in ED Medications  dextrose 5 %-0.45 % sodium chloride infusion (has no administration in time range)  insulin regular, human (MYXREDLIN) 100 units/ 100 mL infusion (has no administration in time range)  sodium chloride 0.9 % bolus 1,000 mL (has no administration in time range)    And  sodium chloride 0.9 % bolus 1,000 mL (has no administration in time range)    And  0.9 %  sodium chloride infusion (has no administration in time range)  cefTRIAXone (ROCEPHIN) 1 g in sodium chloride 0.9 % 100 mL IVPB (has no administration in time range)     Initial Impression / Assessment and Plan / ED Course  I have reviewed the triage vital signs and the nursing notes.  Pertinent labs & imaging results that were available during my care of the patient were reviewed by me and considered in my medical decision making (see chart for details).        BP 126/61   Pulse (!) 115   Resp (!) 22   SpO2 92%    Final Clinical Impressions(s) / ED Diagnoses   Final diagnoses:  None    ED Discharge Orders    None     7:37 PM Patient with altered mental status, abdominal pain with associated nausea and vomiting.  Does have suprapubic catheter there is increased chance of developing underlying UTI.  History of diabetes therefore will check CBG.  Work-up initiated.  8:44 PM Patient is tachypneic with respiratory rate of 22, tachycardic with heart rate of 115 and O2 sats of 92.  CBG is greater than 600.  Will check VBG but suspect some Kussmal breathing 2/2 hyperglycemia.  Will obtain rectal temperature her white count is elevated at 25.1 with an elevated lactic acid of 2.8.  Chest x-ray shows possible tiny left effusion and mild left bibasilar atelectasis.  At this  time, will initiate code sepsis and initiate Rocephin to cover for potential UTI causing delirium.  Patient will receive glucose stabilizer to treat hyperglycemia.  8:56 PM Pt sign out to Dr. Darl Householder who will f/u on abd/pelvis CT along with UA and will admit  to [facility].    Domenic Moras, PA-C 06/08/19 2057    Drenda Freeze, MD 06/08/19 [phone]

## 2019-06-08 NOTE — ED Triage Notes (Signed)
EMS reports that the pt has had AMS 1X day. Type 2 Dm controlled with metformin. cbg 540. Afebrile, 94%o2, 144/67

## 2019-06-08 NOTE — ED Notes (Signed)
Patient transported to CT 

## 2019-06-09 ENCOUNTER — Observation Stay (HOSPITAL_COMMUNITY): Payer: Medicare Other

## 2019-06-09 DIAGNOSIS — I959 Hypotension, unspecified: Secondary | ICD-10-CM | POA: Diagnosis not present

## 2019-06-09 DIAGNOSIS — E785 Hyperlipidemia, unspecified: Secondary | ICD-10-CM | POA: Diagnosis present

## 2019-06-09 DIAGNOSIS — N179 Acute kidney failure, unspecified: Secondary | ICD-10-CM | POA: Diagnosis present

## 2019-06-09 DIAGNOSIS — R7881 Bacteremia: Secondary | ICD-10-CM

## 2019-06-09 DIAGNOSIS — E1110 Type 2 diabetes mellitus with ketoacidosis without coma: Secondary | ICD-10-CM

## 2019-06-09 DIAGNOSIS — M810 Age-related osteoporosis without current pathological fracture: Secondary | ICD-10-CM | POA: Diagnosis present

## 2019-06-09 DIAGNOSIS — R4182 Altered mental status, unspecified: Secondary | ICD-10-CM | POA: Diagnosis present

## 2019-06-09 DIAGNOSIS — Z8711 Personal history of peptic ulcer disease: Secondary | ICD-10-CM | POA: Diagnosis not present

## 2019-06-09 DIAGNOSIS — N319 Neuromuscular dysfunction of bladder, unspecified: Secondary | ICD-10-CM | POA: Diagnosis present

## 2019-06-09 DIAGNOSIS — A4151 Sepsis due to Escherichia coli [E. coli]: Secondary | ICD-10-CM | POA: Diagnosis present

## 2019-06-09 DIAGNOSIS — N390 Urinary tract infection, site not specified: Secondary | ICD-10-CM | POA: Diagnosis present

## 2019-06-09 DIAGNOSIS — K449 Diaphragmatic hernia without obstruction or gangrene: Secondary | ICD-10-CM | POA: Diagnosis present

## 2019-06-09 DIAGNOSIS — R030 Elevated blood-pressure reading, without diagnosis of hypertension: Secondary | ICD-10-CM | POA: Diagnosis not present

## 2019-06-09 DIAGNOSIS — K589 Irritable bowel syndrome without diarrhea: Secondary | ICD-10-CM | POA: Diagnosis not present

## 2019-06-09 DIAGNOSIS — E860 Dehydration: Secondary | ICD-10-CM | POA: Diagnosis present

## 2019-06-09 DIAGNOSIS — Z882 Allergy status to sulfonamides status: Secondary | ICD-10-CM | POA: Diagnosis not present

## 2019-06-09 DIAGNOSIS — Z79899 Other long term (current) drug therapy: Secondary | ICD-10-CM | POA: Diagnosis not present

## 2019-06-09 DIAGNOSIS — N183 Chronic kidney disease, stage 3 unspecified: Secondary | ICD-10-CM

## 2019-06-09 DIAGNOSIS — R000 Tachycardia, unspecified: Secondary | ICD-10-CM | POA: Diagnosis present

## 2019-06-09 DIAGNOSIS — Z20828 Contact with and (suspected) exposure to other viral communicable diseases: Secondary | ICD-10-CM | POA: Diagnosis present

## 2019-06-09 DIAGNOSIS — K219 Gastro-esophageal reflux disease without esophagitis: Secondary | ICD-10-CM | POA: Diagnosis present

## 2019-06-09 DIAGNOSIS — N3281 Overactive bladder: Secondary | ICD-10-CM | POA: Diagnosis present

## 2019-06-09 DIAGNOSIS — F419 Anxiety disorder, unspecified: Secondary | ICD-10-CM | POA: Diagnosis present

## 2019-06-09 DIAGNOSIS — N200 Calculus of kidney: Secondary | ICD-10-CM | POA: Diagnosis present

## 2019-06-09 DIAGNOSIS — N10 Acute tubulo-interstitial nephritis: Secondary | ICD-10-CM | POA: Diagnosis not present

## 2019-06-09 DIAGNOSIS — G35 Multiple sclerosis: Secondary | ICD-10-CM | POA: Diagnosis present

## 2019-06-09 DIAGNOSIS — T83511A Infection and inflammatory reaction due to indwelling urethral catheter, initial encounter: Secondary | ICD-10-CM | POA: Diagnosis not present

## 2019-06-09 DIAGNOSIS — Z23 Encounter for immunization: Secondary | ICD-10-CM | POA: Diagnosis present

## 2019-06-09 DIAGNOSIS — L8990 Pressure ulcer of unspecified site, unspecified stage: Secondary | ICD-10-CM | POA: Insufficient documentation

## 2019-06-09 DIAGNOSIS — A419 Sepsis, unspecified organism: Secondary | ICD-10-CM | POA: Diagnosis present

## 2019-06-09 LAB — BASIC METABOLIC PANEL
Anion gap: 15 (ref 5–15)
Anion gap: 10 (ref 5–15)
Anion gap: 11 (ref 5–15)
Anion gap: 13 (ref 5–15)
BUN: 31 mg/dL — ABNORMAL HIGH (ref 8–23)
BUN: 28 mg/dL — ABNORMAL HIGH (ref 8–23)
BUN: 29 mg/dL — ABNORMAL HIGH (ref 8–23)
BUN: 28 mg/dL — ABNORMAL HIGH (ref 8–23)
CO2: 17 mmol/L — ABNORMAL LOW (ref 22–32)
CO2: 20 mmol/L — ABNORMAL LOW (ref 22–32)
CO2: 18 mmol/L — ABNORMAL LOW (ref 22–32)
CO2: 17 mmol/L — ABNORMAL LOW (ref 22–32)
Calcium: 7.8 mg/dL — ABNORMAL LOW (ref 8.9–10.3)
Calcium: 7.5 mg/dL — ABNORMAL LOW (ref 8.9–10.3)
Calcium: 7.4 mg/dL — ABNORMAL LOW (ref 8.9–10.3)
Calcium: 7.6 mg/dL — ABNORMAL LOW (ref 8.9–10.3)
Chloride: 101 mmol/L (ref 98–111)
Chloride: 108 mmol/L (ref 98–111)
Chloride: 106 mmol/L (ref 98–111)
Chloride: 105 mmol/L (ref 98–111)
Creatinine, Ser: 1.7 mg/dL — ABNORMAL HIGH (ref 0.44–1.00)
Creatinine, Ser: 1.45 mg/dL — ABNORMAL HIGH (ref 0.44–1.00)
Creatinine, Ser: 1.27 mg/dL — ABNORMAL HIGH (ref 0.44–1.00)
Creatinine, Ser: 1.25 mg/dL — ABNORMAL HIGH (ref 0.44–1.00)
GFR calc Af Amer: 34 mL/min — ABNORMAL LOW (ref >60)
GFR calc Af Amer: 41 mL/min — ABNORMAL LOW (ref >60)
GFR calc Af Amer: 48 mL/min — ABNORMAL LOW (ref >60)
GFR calc Af Amer: 49 mL/min — ABNORMAL LOW (ref >60)
GFR calc non Af Amer: 29 mL/min — ABNORMAL LOW (ref >60)
GFR calc non Af Amer: 35 mL/min — ABNORMAL LOW (ref >60)
GFR calc non Af Amer: 42 mL/min — ABNORMAL LOW (ref >60)
GFR calc non Af Amer: 42 mL/min — ABNORMAL LOW (ref >60)
Glucose, Bld: 485 mg/dL — ABNORMAL HIGH (ref 70–99)
Glucose, Bld: 164 mg/dL — ABNORMAL HIGH (ref 70–99)
Glucose, Bld: 156 mg/dL — ABNORMAL HIGH (ref 70–99)
Glucose, Bld: 173 mg/dL — ABNORMAL HIGH (ref 70–99)
Potassium: 3.2 mmol/L — ABNORMAL LOW (ref 3.5–5.1)
Potassium: 4.5 mmol/L (ref 3.5–5.1)
Potassium: 3.8 mmol/L (ref 3.5–5.1)
Potassium: 3.7 mmol/L (ref 3.5–5.1)
Sodium: 133 mmol/L — ABNORMAL LOW (ref 135–145)
Sodium: 138 mmol/L (ref 135–145)
Sodium: 135 mmol/L (ref 135–145)
Sodium: 135 mmol/L (ref 135–145)

## 2019-06-09 LAB — BLOOD CULTURE ID PANEL (REFLEXED)

## 2019-06-09 LAB — CBC
HCT: 38.2 % (ref 36.0–46.0)
HCT: 38.9 % (ref 36.0–46.0)
Hemoglobin: 12.1 g/dL (ref 12.0–15.0)
Hemoglobin: 12.5 g/dL (ref 12.0–15.0)
MCH: 27.4 pg (ref 26.0–34.0)
MCH: 26.9 pg (ref 26.0–34.0)
MCHC: 31.7 g/dL (ref 30.0–36.0)
MCHC: 32.1 g/dL (ref 30.0–36.0)
MCV: 86.4 fL (ref 80.0–100.0)
MCV: 83.7 fL (ref 80.0–100.0)
Platelets: 282 10*3/uL (ref 150–400)
Platelets: 234 10*3/uL (ref 150–400)
RBC: 4.42 MIL/uL (ref 3.87–5.11)
RBC: 4.65 MIL/uL (ref 3.87–5.11)
RDW: 14 % (ref 11.5–15.5)
RDW: 14.1 % (ref 11.5–15.5)
WBC: 24.9 10*3/uL — ABNORMAL HIGH (ref 4.0–10.5)
WBC: 20.6 10*3/uL — ABNORMAL HIGH (ref 4.0–10.5)
nRBC: 0 % (ref 0.0–0.2)
nRBC: 0 % (ref 0.0–0.2)

## 2019-06-09 LAB — RAPID URINE DRUG SCREEN, HOSP PERFORMED
Amphetamines: NOT DETECTED
Barbiturates: NOT DETECTED
Benzodiazepines: NOT DETECTED
Cocaine: NOT DETECTED
Opiates: NOT DETECTED
Tetrahydrocannabinol: NOT DETECTED

## 2019-06-09 LAB — GLUCOSE, CAPILLARY
Glucose-Capillary: 401 mg/dL — ABNORMAL HIGH (ref 70–99)
Glucose-Capillary: 355 mg/dL — ABNORMAL HIGH (ref 70–99)
Glucose-Capillary: 290 mg/dL — ABNORMAL HIGH (ref 70–99)
Glucose-Capillary: 246 mg/dL — ABNORMAL HIGH (ref 70–99)
Glucose-Capillary: 204 mg/dL — ABNORMAL HIGH (ref 70–99)
Glucose-Capillary: 174 mg/dL — ABNORMAL HIGH (ref 70–99)
Glucose-Capillary: 145 mg/dL — ABNORMAL HIGH (ref 70–99)
Glucose-Capillary: 129 mg/dL — ABNORMAL HIGH (ref 70–99)
Glucose-Capillary: 121 mg/dL — ABNORMAL HIGH (ref 70–99)
Glucose-Capillary: 168 mg/dL — ABNORMAL HIGH (ref 70–99)
Glucose-Capillary: 156 mg/dL — ABNORMAL HIGH (ref 70–99)
Glucose-Capillary: 136 mg/dL — ABNORMAL HIGH (ref 70–99)
Glucose-Capillary: 179 mg/dL — ABNORMAL HIGH (ref 70–99)
Glucose-Capillary: 205 mg/dL — ABNORMAL HIGH (ref 70–99)

## 2019-06-09 LAB — MRSA PCR SCREENING: MRSA by PCR: NEGATIVE

## 2019-06-09 LAB — LACTIC ACID, PLASMA
Lactic Acid, Venous: 3.9 mmol/L (ref 0.5–1.9)
Lactic Acid, Venous: 3.8 mmol/L (ref 0.5–1.9)
Lactic Acid, Venous: 1.7 mmol/L (ref 0.5–1.9)

## 2019-06-09 LAB — TROPONIN I (HIGH SENSITIVITY)
Troponin I (High Sensitivity): 48 ng/L — ABNORMAL HIGH (ref <18)
Troponin I (High Sensitivity): 61 ng/L — ABNORMAL HIGH (ref <18)
Troponin I (High Sensitivity): 41 ng/L — ABNORMAL HIGH (ref <18)
Troponin I (High Sensitivity): 45 ng/L — ABNORMAL HIGH (ref <18)

## 2019-06-09 LAB — HEMOGLOBIN A1C
Hgb A1c MFr Bld: 12.3 % — ABNORMAL HIGH (ref 4.8–5.6)
Mean Plasma Glucose: 306.31 mg/dL

## 2019-06-09 LAB — CBG MONITORING, ED: Glucose-Capillary: 450 mg/dL — ABNORMAL HIGH (ref 70–99)

## 2019-06-09 LAB — BETA-HYDROXYBUTYRIC ACID: Beta-Hydroxybutyric Acid: 0.85 mmol/L — ABNORMAL HIGH (ref 0.05–0.27)

## 2019-06-09 MED ORDER — SODIUM CHLORIDE 0.9 % IV SOLN: 1.0000 g | Freq: Every day | INTRAVENOUS | Status: DC

## 2019-06-09 MED ORDER — POTASSIUM CHLORIDE IN NACL 40-0.9 MEQ/L-% IV SOLN
INTRAVENOUS | Status: DC
  Administered 2019-06-09: 150 mL/h via INTRAVENOUS
  Filled 2019-06-09: qty 1000

## 2019-06-09 MED ORDER — POTASSIUM CHLORIDE 10 MEQ/100ML IV SOLN
10.0000 meq | INTRAVENOUS | Status: AC
  Administered 2019-06-09 (×3): 10 meq via INTRAVENOUS
  Filled 2019-06-09 (×3): qty 100

## 2019-06-09 MED ORDER — LIVING WELL WITH DIABETES BOOK
Freq: Once | Status: AC
  Administered 2019-06-09: 11:00:00
  Filled 2019-06-09: qty 1

## 2019-06-09 MED ORDER — PIPERACILLIN-TAZOBACTAM 3.375 G IVPB 30 MIN
3.3750 g | Freq: Once | INTRAVENOUS | Status: AC
  Administered 2019-06-09: 3.375 g via INTRAVENOUS
  Filled 2019-06-09 (×2): qty 50

## 2019-06-09 MED ORDER — ACETAMINOPHEN 325 MG PO TABS
650.0000 mg | ORAL_TABLET | Freq: Four times a day (QID) | ORAL | Status: DC | PRN
  Administered 2019-06-09 (×2): 650 mg via ORAL
  Filled 2019-06-09 (×2): qty 2

## 2019-06-09 MED ORDER — SODIUM CHLORIDE 0.9 % IV SOLN
2.0000 g | INTRAVENOUS | Status: DC
  Administered 2019-06-09 – 2019-06-10 (×2): 2 g via INTRAVENOUS
  Filled 2019-06-09 (×2): qty 20

## 2019-06-09 MED ORDER — BUPROPION HCL 75 MG PO TABS
75.0000 mg | ORAL_TABLET | Freq: Every day | ORAL | Status: DC
  Administered 2019-06-10 – 2019-06-12 (×3): 75 mg via ORAL
  Filled 2019-06-09 (×3): qty 1

## 2019-06-09 MED ORDER — PRAVASTATIN SODIUM 10 MG PO TABS
20.0000 mg | ORAL_TABLET | Freq: Every day | ORAL | Status: DC
  Administered 2019-06-09 – 2019-06-12 (×4): 20 mg via ORAL
  Filled 2019-06-09 (×4): qty 2

## 2019-06-09 MED ORDER — SODIUM CHLORIDE 0.9 % IV BOLUS
1000.0000 mL | Freq: Once | INTRAVENOUS | Status: AC
  Administered 2019-06-09: 1000 mL via INTRAVENOUS

## 2019-06-09 MED ORDER — INSULIN ASPART 100 UNIT/ML ~~LOC~~ SOLN
0.0000 [IU] | Freq: Three times a day (TID) | SUBCUTANEOUS | Status: DC
  Administered 2019-06-09: 2 [IU] via SUBCUTANEOUS
  Administered 2019-06-10: 5 [IU] via SUBCUTANEOUS
  Administered 2019-06-10: 7 [IU] via SUBCUTANEOUS
  Administered 2019-06-10: 3 [IU] via SUBCUTANEOUS
  Administered 2019-06-11: 5 [IU] via SUBCUTANEOUS
  Administered 2019-06-11 – 2019-06-12 (×5): 3 [IU] via SUBCUTANEOUS

## 2019-06-09 MED ORDER — PIPERACILLIN-TAZOBACTAM 3.375 G IVPB: 3.3750 g | Freq: Three times a day (TID) | INTRAVENOUS | Status: DC

## 2019-06-09 MED ORDER — INSULIN GLARGINE 100 UNIT/ML ~~LOC~~ SOLN
10.0000 [IU] | Freq: Every day | SUBCUTANEOUS | Status: DC
  Administered 2019-06-09 – 2019-06-10 (×2): 10 [IU] via SUBCUTANEOUS
  Filled 2019-06-09 (×2): qty 0.1

## 2019-06-09 NOTE — Progress Notes (Signed)
Inpatient Diabetes Program Recommendations  AACE/ADA: New Consensus Statement on Inpatient Glycemic Control (2015)  Target Ranges:  Prepandial:   less than 140 mg/dL      Peak postprandial:   less than 180 mg/dL (1-2 hours)      Critically ill patients:  140 - 180 mg/dL   Lab Results  Component Value Date   GLUCAP 129 (H) 06/09/2019   HGBA1C 12.3 (H) 06/09/2019    Review of Glycemic Control  Diabetes history: DM 2 Outpatient Diabetes medications: Metformin 500 mg Daily Current orders for Inpatient glycemic control:  IV insulin gtt  A1c 12.3% this admission  Inpatient Diabetes Program Recommendations:    Patient requiring at least 4 units/hour on IV insulin gtt presently to keep current glucose levels.  At time of transition consider at least Lantus 14 units (0.2 units/kg) Novolog 0-9 units tid + Novolog 0-5 units qhs for bedtime coverage  Patient would benefit from basal insulin in addition to an increase in metformin if tolerated at time of d/c.  Will see patient this afternoon if appropriate to discuss A1c and possible insulin at d/c.  Thanks,  Tama Headings RN, MSN, BC-ADM Inpatient Diabetes Coordinator Team Pager [phone] (8a-5p)

## 2019-06-09 NOTE — Progress Notes (Signed)
Patient is complaining of pain in her LLE (tingling). MD notified. Will continue to monitor.

## 2019-06-09 NOTE — Progress Notes (Signed)
PT Cancellation Note  Patient Details Name: Claudia Walter MRN: [id number] DOB: 14-Nov-1945   Cancelled Treatment:    Reason Eval/Treat Not Completed: Patient not medically ready  Noted worsening sepsis. Will monitor for appropriateness to proceed with eval.    Barry Brunner, PT     [name] 06/09/2019, 8:30 AM

## 2019-06-09 NOTE — Progress Notes (Signed)
@  0415 Orders reviewed with Dr.Patel (family medicine) and will continue NS @150  as well as D5 1/2NS @75  now that pt glucose is below 250.  Pt has tolerated all IV therapy well thus far. Will continue to monitor pt for symptoms of fluid overload.  Dr.Patel also instructed RN to continue insulin drip at this time.   @0600  Pt increased respirations, pulse and BP along with lung sounds suggest signs of possible fluid overload.  MD Posey Pronto contacted with pt update and MD's came to visit pt. NS stopped and additional labs and tests ordered.  Will continue to monitor for safety.

## 2019-06-09 NOTE — Progress Notes (Signed)
OT Cancellation Note  Patient Details Name: Claudia Walter MRN: [id number] DOB: 1946-05-06   Cancelled Treatment:    Reason Eval/Treat Not Completed: Patient not medically ready OT order received, pt chart reviewed. Per chart review pt with worsening sepsis and inappropriate lab values to initiate OT POC. Will continue to follow as pt available and appropriate.   Zenovia Jarred, MSOT, OTR/L Behavioral Health OT/ Acute Relief OT [facility] Office: [place] 06/09/2019, 8:37 AM

## 2019-06-09 NOTE — Progress Notes (Signed)
MD notified about patient's high HR 120. BP 127/52; Sat O2 95; RR 23. Will continue to monitor.

## 2019-06-09 NOTE — Progress Notes (Signed)
Inpatient Diabetes Program Recommendations  AACE/ADA: New Consensus Statement on Inpatient Glycemic Control (2015)  Target Ranges:  Prepandial:   less than 140 mg/dL      Peak postprandial:   less than 180 mg/dL (1-2 hours)      Critically ill patients:  140 - 180 mg/dL   Lab Results  Component Value Date   GLUCAP 136 (H) 06/09/2019   HGBA1C 12.3 (H) 06/09/2019    Review of Glycemic Control  Diabetes history: DM 2 Outpatient Diabetes medications: Metformin Current orders for Inpatient glycemic control:  Lantus 10 units Novolog 0-9 units tid  Spoke with patient about diabetes and home regimen for diabetes control.  Patient reports that she has only been on metformin. However patient had not been taking mEtformin outpatient. Patient also reports her daughter in law cooks and she makes a lot of proteins and carbohydrates.   Discussed A1c results, 12.3% this admission. Discussed how each oral medication for DM can only lower the A1c 1-2% at the most each. Showed her the insulin pen if she were to be placed on it.  Patient had her Living well with Diabetes booklet and had a lot of questions about the different types or oral meds and injectables for management of DM. Answered her questions and encouraged her to ask her attending MD about the medications.  Discussed in detail carbohydrates, serving sizes, portion sizes, beverage options.    Patient reports having a glucometer at home but needs strips. Patient would also like PT and OT consults.  Patient had questions about exercises since she is wheel chair bound at home. Discussed and demonstrated several chair exercises for patient.  Discussed importance of checking CBGs and maintaining good CBG control to prevent long-term and short-term complications.   Patient verbalized understanding of information discussed and has no further questions at this time related to diabetes.   Thanks,  Tama Headings RN, MSN, [facility] Inpatient  Diabetes Coordinator Team Pager [phone] (8a-5p)

## 2019-06-09 NOTE — Progress Notes (Signed)
10 units Lantus administered at 11:50.

## 2019-06-09 NOTE — Progress Notes (Signed)
Patient's temp is 101.4. Patient doesn't have anything PRN for fewer. MD notified. Will continue to monitor.

## 2019-06-09 NOTE — Progress Notes (Signed)
CRITICAL VALUE ALERT  Critical Value:  Lactic Acis 3.8  Date & Time Notied:  06/09/2019; 07:26  Provider Notified: Family Medicine  Orders Received/Actions taken:

## 2019-06-09 NOTE — Progress Notes (Signed)
Pharmacy Antibiotic Note  Claudia Walter is a 73 y.o. female admitted on 06/08/2019 with sepsis and DKA/HHS. Pharmacy has been consulted for Zosyn dosing for sepsis from possible UTI or aspiration pneumonia. On admission, WBC 25.1 febrile (Tmax 101.6), and LA 2.8. Started on CTX at and LA rose to 3.9, febrile (101.4), and WBC still elevated (24.9). Plan to broad antibiotics to cover anaerobes, pseudomonas, and enterococcus. Will administer first dose of Zosyn as 30 minute infusion with subsequent doses as extended 4 hour infusion. Lactic acid now downtrending prior to initiation of Zosyn.   Plan: Start Zosyn IV 3.3.75g q8h  Monitor Scr, C/S, and clinical progression   Height: 5\' 2"  (157.5 cm) Weight: 156 lb 12 oz (71.1 kg) IBW/kg (Calculated) : 50.1  Temp (24hrs), Avg:100 F (37.8 C), Min:98.7 F (37.1 C), Max:101.6 F (38.7 C)  Recent Labs  Lab 06/08/19 1950 06/08/19 2000 06/08/19 2216 06/09/19 0024 06/09/19 0606 06/09/19 1109  WBC  --  25.1*  --   --  24.9*  --   CREATININE  --  1.83*  --  1.70* 1.45* 1.27*  LATICACIDVEN 2.8*  --  3.7* 3.9* 3.8*  --     Estimated Creatinine Clearance: 35.9 mL/min (A) (by C-G formula based on SCr of 1.27 mg/dL (H)).    Allergies  Allergen Reactions  . Sulfa Antibiotics Nausea And Vomiting    Antimicrobials this admission: Zosyn 8/14 >> CTX x1 on 8/13  Microbiology results: 8/13 BCx: gram variable rod 8/13 UCx: sent 8/14 MRSA PCR: negative  8/13 covid: negative   Thank you for allowing pharmacy to be a part of this patient's care.  Cristela Felt, PharmD PGY1 Pharmacy Resident [employer]: [phone]  06/09/2019 1:08 PM

## 2019-06-09 NOTE — Progress Notes (Signed)
PHARMACY - PHYSICIAN COMMUNICATION CRITICAL VALUE ALERT - BLOOD CULTURE IDENTIFICATION (BCID)  Ernestyne Krenzke is an 73 y.o. female who presented to [facility] on 06/08/2019 with a chief complaint of confusion and nausea.   Assessment: Patient admitted with sepsis and DKA/HHS. Started on ceftriaxone for sepsis from possible UTI or aspiration pneumonia which was broaden to Zosyn for pseudomonas and anaerobic coverage when fevers continued, WBC remained elevated, and lactic acid rose after initiation of CTX. BCID identified e. Coli. Recommend discontinuing Zosyn and starting ceftriaxone 2g IV q24h at 2000 on 8/14.   Name of physician (or Provider) Contacted: Gifford Shave  Current antibiotics: Zosyn 3.375g IV q8h  Changes to prescribed antibiotics recommended: Discontinue Zosyn and start ceftriaxone 2g IV q24h. Recommendations accepted by provider  Results for orders placed or performed during the hospital encounter of 06/08/19  Blood Culture ID Panel (Reflexed) (Collected: 06/08/2019  9:45 PM)  Result Value Ref Range   Enterococcus species NOT DETECTED NOT DETECTED   Listeria monocytogenes NOT DETECTED NOT DETECTED   Staphylococcus species NOT DETECTED NOT DETECTED   Staphylococcus aureus (BCID) NOT DETECTED NOT DETECTED   Streptococcus species NOT DETECTED NOT DETECTED   Streptococcus agalactiae NOT DETECTED NOT DETECTED   Streptococcus pneumoniae NOT DETECTED NOT DETECTED   Streptococcus pyogenes NOT DETECTED NOT DETECTED   Acinetobacter baumannii NOT DETECTED NOT DETECTED   Enterobacteriaceae species DETECTED (A) NOT DETECTED   Enterobacter cloacae complex NOT DETECTED NOT DETECTED   Escherichia coli DETECTED (A) NOT DETECTED   Klebsiella oxytoca NOT DETECTED NOT DETECTED   Klebsiella pneumoniae NOT DETECTED NOT DETECTED   Proteus species NOT DETECTED NOT DETECTED   Serratia marcescens NOT DETECTED NOT DETECTED   Carbapenem resistance NOT DETECTED NOT DETECTED   Haemophilus influenzae  NOT DETECTED NOT DETECTED   Neisseria meningitidis NOT DETECTED NOT DETECTED   Pseudomonas aeruginosa NOT DETECTED NOT DETECTED   Candida albicans NOT DETECTED NOT DETECTED   Candida glabrata NOT DETECTED NOT DETECTED   Candida krusei NOT DETECTED NOT DETECTED   Candida parapsilosis NOT DETECTED NOT DETECTED   Candida tropicalis NOT DETECTED NOT DETECTED   Cristela Felt, PharmD PGY1 Pharmacy Resident [employer]: [phone]  06/09/2019  2:44 PM

## 2019-06-09 NOTE — Progress Notes (Signed)
Insulin gtt completed and stopped at 1350.

## 2019-06-09 NOTE — Progress Notes (Addendum)
At this time patient is in Radiology for CXR. Mews score elevated (5). MD made aware

## 2019-06-09 NOTE — Progress Notes (Signed)
Family Medicine Teaching Service Daily Progress Note Intern Pager: [phone]  Patient name: Claudia Walter Medical record number: [id number] Date of birth: 1945-12-15 Age: 73 y.o. Gender: female  Primary Care Provider: Gladys Damme, MD Consultants: Urology Code Status: Full  Pt Overview and Major Events to Date:  Patient admitted for sepsis  Assessment and Plan: Claudia Walter is a 73 year old female presenting with sepsis, altered mental status and DKA.  Past medical history significant for neurogenic bladder, diabetes, multiple sclerosis, overactive bladder, IBS, and HDL.  Urosepsis Patient was admitted after 1 to 2-day history of altered mental status vomiting, fatigue on admission patient was tachypneic(24), tachycardic(115), and febrile up to 101.6 patient was alert and oriented x2, hypovolemic, with diffuse lower abdominal tenderness.  WBC was 25,.  Patient has a suprapubic cath urine glucose was greater than 500, positive nitrates, large hemoglobin, and small leukocytes.  Urine culture was drawn as well as blood cultures.  CT abdomen showed 15 mm nonobstructing stone in the right renal upper pole.  X-rays showed no evidence of pneumonia lactic acid was trended from 2.8>3.7>3.9.  Patient was started on ceftriaxone patient was reevaluated this morning and found to have an altered mental status.  She was oriented to self but not to place or time.  She reported when asked to the present dent was "Bush".  Micro for blood cultures grew E. coli and Enterobacter - Telemetry with continuous pulse ox - Strict I's and O's - Trend lactic acid until below 2 - 2 g every 4 hours - Tylenol 650 mg for fever - PT, OT -CSW   DKA Patient's last A1c was 6.5 on 7/19.  Patient's home medication is metformin 500 mg daily.  Blood sugars overnight were 702> 538>450>401>355>290>246>204>174>145>129.  Patient's pH was 7.2, anion gap 18, with ketonuria.  Patient received 10 mEq  Kx4 due to K 3.3.  Patient was started  on insulin drip.  Patient's gap closed this morning and patient was weaned off insulin drip. -Holding metformin -N.p.o. - Patient receiving D5 normal saline. - NovoLog sensitive correction - Lantus 10 units daily -Hourly CBGs -BMP every 4 hours  AKI Creatinine was 1.7 on admission with a baseline being 0.09.  BUN was 32.  Most likely due to dehydration. - Most recent creatinine is 1.25 -Continue to trend the BMPs -Continue IV fluids  Lactic acidosis 2.8>3.7>3.9>3.8 most likely due to sepsis and DKA.  Downtrending at this time. -Continue to hydrate with IV fluids. - Trend lactic acid until below 2  #Pseudohyponatremia Na 130, corrected Na 140.  Secondary to hyperglycemia. -Continue to monitor with BMP  #Multiple sclerosis Diagnosed 9 years ago, patient has been doing well post diagnosis according to son. 5/5 strength upper extremity, 1/5 strength lower extremity-able to move toes slightly bilaterally, normal sensation of upper and lower extremities.  Fasciculations noted in right calf.  Additional problem of lower limbs includes reduced mobility secondary to complication in right hip surgery many years ago Takes baclofen 20mg  BID, gabapentin 600mg  BID -Holding at this time   #Neurogenic bladder Takes oxybutynin 5mg  once daily -Continue to hold due to hypotension   #HLD Last lipid panel: 2019,TAGs 249, LDL 159, HDL 41, Chol 250, VLDL 50 -Pravastatin 20mg   -Can restart when off insulin drip  #IBS Takes dicyclomine 10 mg Q8PRN  -Continue to hold  #GERD/hiatus hernia Takes omeprazole 20mg  once daily -Can restart once off insulin drip  #Nutrition  Takes vitamin C 100 mg, cyanocobalamin 75mg  once daily  -Can restart once off  insulin drip  #Tobacco misuse disorder Takes Bupropion 75 mg once daily -Can restart once off insulin drip  #Inadequate social support Patient lives with son and his wife, who are very caring and supportive.  Have the best interest for the  patient.  However patient may benefit from additional help at home in addition to family support.  Family reported that they were feeding patient was lying supine, increased risk of aspiration and perhaps have poor understanding of her chronic conditions and how best to support her.  On multiple occasions during our phone conversation, the son mentioned that she had had similar presentations before and she only required intravenous vitamin C in order to get better and come home. -Consult to clinical social work  FEN/GI: Carb modified once off insulin drip PPx: Lovenox  Disposition: Pending further stabilization  Subjective:  Patient was doing well this morning.  Mentating better than previously.  Was alert and oriented x3.  Patient reports she feels much better than she did earlier today.  Reports she has been unable to use her lower extremities since her diagnosis of MS.  I discussed her diabetes management.  No questions or concerns at this time.  Objective: Temp:  [98.7 F (37.1 C)-101.6 F (38.7 C)] 98.7 F (37.1 C) (08/14 0533) Pulse Rate:  [98-115] 98 (08/14 0356) Resp:  [20-26] 25 (08/14 0533) BP: (116-152)/(51-103) 123/103 (08/14 0533) SpO2:  [92 %-100 %] 97 % (08/14 0356) Weight:  [71.1 kg] 71.1 kg (08/14 0130) Physical Exam: General: Alert and cooperative and appears to be in no acute distress HEENT: Neck non-tender without lymphadenopathy, masses or thyromegaly Cardio: Normal S1 and S2, no S3 or S4. Rhythm is regula. No murmurs or rubs.   Pulm: Clear to auscultation bilaterally, no crackles, wheezing, or diminished breath sounds. Normal respiratory effort Abdomen: Bowel sounds normal. Abdomen soft and non-tender.  Patient with suprapubic cath Extremities: Mild peripheral edema in lower extremities Neuro: Cranial nerves grossly intact   Laboratory: Recent Labs  Lab 06/08/19 2000 06/08/19 2154  WBC 25.1*  --   HGB 13.5 17.7*  HCT 42.9 52.0*  PLT 295  --    Recent  Labs  Lab 06/08/19 2000 06/08/19 2154 06/09/19 0024  NA 130* 130* 133*  K 4.7 4.7 3.2*  CL 93*  --  101  CO2 19*  --  17*  BUN 32*  --  31*  CREATININE 1.83*  --  1.70*  CALCIUM 8.6*  --  7.8*  PROT 6.5  --   --   BILITOT 0.9  --   --   ALKPHOS 135*  --   --   ALT 27  --   --   AST 37  --   --   GLUCOSE 702*  --  485*    Imaging/Diagnostic Tests: Dg Chest 2 View  Result Date: 06/09/2019 CLINICAL DATA:  Shortness of breath EXAM: CHEST - 2 VIEW COMPARISON:  June 08, 2019 FINDINGS: There is a minimal left pleural effusion with slight left base atelectasis. The lungs elsewhere are clear. Heart size and pulmonary vascularity are normal. No adenopathy. There is aortic atherosclerosis. No bone lesions. IMPRESSION: Minimal left pleural effusion with slight left base atelectasis. Lungs elsewhere clear. Cardiac silhouette within normal limits. Aortic Atherosclerosis (ICD10-I70.0). Electronically Signed   By: Lowella Grip III M.D.   On: 06/09/2019 08:47   Ct Head Wo Contrast  Result Date: 06/09/2019 CLINICAL DATA:  Altered mental status.  Hyperglycemia EXAM: CT HEAD WITHOUT CONTRAST TECHNIQUE:  Contiguous axial images were obtained from the base of the skull through the vertex without intravenous contrast. COMPARISON:  None. FINDINGS: Brain: There is mild diffuse atrophy. There is no intracranial mass hemorrhage, extra-axial fluid collection, or midline shift. There is slight small vessel disease in the centra semiovale bilaterally. Elsewhere brain parenchyma appears unremarkable. No acute infarct evident. Vascular: There is no hyperdense vessel. There is calcification in each carotid siphon region. Skull: The bony calvarium appears intact. Sinuses/Orbits: There is mucosal thickening in several ethmoid air cells. Other paranasal sinuses are clear. Orbits appear symmetric bilaterally. Other: Mastoid air cells are clear. There is debris in the left external auditory canal. IMPRESSION: Mild  atrophy with slight periventricular small vessel disease. No acute infarct evident. No mass or hemorrhage. There are foci of arterial vascular calcification. There is mucosal thickening in several ethmoid air cells. There is probable cerumen in the left external auditory canal. Electronically Signed   By: Lowella Grip III M.D.   On: 06/09/2019 08:46   Dg Chest Portable 1 View  Result Date: 06/08/2019 CLINICAL DATA:  Altered mental status EXAM: PORTABLE CHEST 1 VIEW COMPARISON:  None. FINDINGS: Linear atelectasis left base. Possible tiny left effusion. Normal heart size. Aortic atherosclerosis. No pneumothorax. IMPRESSION: Possible tiny left effusion with mild left basilar atelectasis Electronically Signed   By: Donavan Foil M.D.   On: 06/08/2019 20:21   Ct Renal Stone Study  Result Date: 06/08/2019 CLINICAL DATA:  73 year old female with altered mental status and flank pain. Concern for kidney stone. EXAM: CT ABDOMEN AND PELVIS WITHOUT CONTRAST TECHNIQUE: Multidetector CT imaging of the abdomen and pelvis was performed following the standard protocol without IV contrast. COMPARISON:  None. FINDINGS: Evaluation of this exam is limited in the absence of intravenous contrast. Lower chest: Minimal bibasilar dependent atelectatic changes. Trace bilateral pleural effusions with present. Atherosclerotic calcification of the mitral annulus noted. There is trace pericardial effusion. There is hypoattenuation of the cardiac blood pool suggestive of a degree of anemia. Clinical correlation is recommended. No intra-abdominal free air or free fluid. Hepatobiliary: There is diffuse fatty infiltration of the liver. No intrahepatic biliary ductal dilatation. An ill-defined 10 mm hypodense focus in the central liver is not characterized but may represent a cyst. No calcified gallstone or pericholecystic fluid. Pancreas: Unremarkable. No pancreatic ductal dilatation or surrounding inflammatory changes. Spleen: Normal in  size without focal abnormality. Adrenals/Urinary Tract: The adrenal glands are unremarkable. There is a large right renal upper pole nonobstructing calculus measuring approximately 15 mm. Several additional nonobstructing calculi noted in the right kidney. There is no hydronephrosis. There is mild right perinephric stranding. Correlation with urinalysis recommended to exclude UTI. There is no hydronephrosis or nephrolithiasis on the left. Subcentimeter exophytic left renal interpolar hypodense lesion is not characterized on this CT. The visualized ureters are unremarkable. The urinary bladder is decompressed around a suprapubic catheter. Stomach/Bowel: There is a small hiatal hernia. There is moderate stool throughout the colon. No bowel obstruction or active inflammation. The appendix is unremarkable as visualized. Vascular/Lymphatic: Mild atherosclerotic calcification of the aorta. The IVC is unremarkable. No portal venous gas. There is no adenopathy. Reproductive: The uterus and ovaries are grossly unremarkable. Calcification along the right adnexal vascular pedicle noted. Other: There is diffuse subcutaneous edema. Diffuse fatty atrophy of the abdominal and pelvic musculature. Musculoskeletal: Absent right femoral head with chronic deformity and heterotopic bone formation in the right hip. Severe left hip osteoarthritis. Advanced osteopenia. Multilevel age indeterminate, old-appearing compression fractures involving L1, L3,  L5. No retropulsed fragment. No definite acute fracture. IMPRESSION: 1. Nonobstructing right renal calculi. No hydronephrosis or obstructing stone. Correlation with urinalysis recommended to exclude UTI. 2. Fatty liver. 3. Moderate colonic stool burden. No bowel obstruction or active inflammation. Normal appendix. 4. Aortic Atherosclerosis (ICD10-I70.0). Electronically Signed   By: Anner Crete M.D.   On: 06/08/2019 23:02     Gifford Shave, MD 06/09/2019, 7:03 AM PGY-1 [facility] Intern pager: [phone], text pages welcome

## 2019-06-09 NOTE — Progress Notes (Signed)
S)Claudia Walter was assessed at 6 am this morning due to worsening lab findings.  On entering the room, she was found to be sitting up in bed shivering.  She reported an episode of sharp chest pain earlier in the evening.  She had no other additional complaints or new complaints.  O) BP (!) 119/56 (BP Location: Right Arm)   Pulse (!) 115   Temp (!) 101.4 F (38.6 C) (Oral)   Resp (!) 25   Ht 5\' 2"  (1.575 m)   Wt 71.1 kg   SpO2 94%   BMI 28.67 kg/m   Physical exam General: Ill-appearing 73 year old woman.  Uncomfortable appearing, rigors.  Oriented to person only (she believed the year was 2012 the president was Maudie Flakes) HEENT: JVD difficult to assess due to neck girth. Cardiac: Mildly tachycardic.  Regular rhythm.  No murmurs/rubs/gallops. Respiratory: Worsened compared to previous exam.  Rhonchorous breath sounds/rales noted in left middle field and left lower field. Extremities: Warm extremities.  Strong radial pulse.  Lower extremity edema similar to previous exam.  A/P) 73 year old woman coming in with AMS, DKA, sepsis likely secondary to UTI.  She appears subjectively worse compared to previous exam earlier in the night.  Sepsis-worsened -S/p roughly 4 L normal saline -Repeat chest x-ray -Decrease fluids to D5 1/2 NS at 75 mL's per hour -We will discuss broadening antibiotics with attending/day team.  AMS -CT head -Follow-up UDS  Chest pain -Follow-up EKG -Follow-up troponin

## 2019-06-09 NOTE — Progress Notes (Signed)
MD aware about patient's VS. Will continue to monitor.

## 2019-06-10 DIAGNOSIS — R7881 Bacteremia: Secondary | ICD-10-CM

## 2019-06-10 DIAGNOSIS — T83511A Infection and inflammatory reaction due to indwelling urethral catheter, initial encounter: Secondary | ICD-10-CM

## 2019-06-10 DIAGNOSIS — A4151 Sepsis due to Escherichia coli [E. coli]: Principal | ICD-10-CM

## 2019-06-10 DIAGNOSIS — N390 Urinary tract infection, site not specified: Secondary | ICD-10-CM

## 2019-06-10 LAB — CBC WITH DIFFERENTIAL/PLATELET
Abs Immature Granulocytes: 0.28 10*3/uL — ABNORMAL HIGH (ref 0.00–0.07)
Basophils Absolute: 0.1 10*3/uL (ref 0.0–0.1)
Basophils Relative: 0 %
Eosinophils Absolute: 0 10*3/uL (ref 0.0–0.5)
Eosinophils Relative: 0 %
HCT: 32.7 % — ABNORMAL LOW (ref 36.0–46.0)
Hemoglobin: 10.6 g/dL — ABNORMAL LOW (ref 12.0–15.0)
Immature Granulocytes: 2 %
Lymphocytes Relative: 6 %
Lymphs Abs: 0.9 10*3/uL (ref 0.7–4.0)
MCH: 26.8 pg (ref 26.0–34.0)
MCHC: 32.4 g/dL (ref 30.0–36.0)
MCV: 82.6 fL (ref 80.0–100.0)
Monocytes Absolute: 0.3 10*3/uL (ref 0.1–1.0)
Monocytes Relative: 2 %
Neutro Abs: 13.4 10*3/uL — ABNORMAL HIGH (ref 1.7–7.7)
Neutrophils Relative %: 90 %
Platelets: 216 10*3/uL (ref 150–400)
RBC: 3.96 MIL/uL (ref 3.87–5.11)
RDW: 14.1 % (ref 11.5–15.5)
WBC: 15 10*3/uL — ABNORMAL HIGH (ref 4.0–10.5)
nRBC: 0 % (ref 0.0–0.2)

## 2019-06-10 LAB — URINALYSIS, ROUTINE W REFLEX MICROSCOPIC
Bilirubin Urine: NEGATIVE
Glucose, UA: >=500 mg/dL — AB
Ketones, ur: NEGATIVE mg/dL
Nitrite: NEGATIVE
Protein, ur: 100 mg/dL — AB
Specific Gravity, Urine: 1.011 (ref 1.005–1.030)
pH: 5 (ref 5.0–8.0)

## 2019-06-10 LAB — URINE CULTURE

## 2019-06-10 LAB — BASIC METABOLIC PANEL
Anion gap: 12 (ref 5–15)
BUN: 24 mg/dL — ABNORMAL HIGH (ref 8–23)
CO2: 18 mmol/L — ABNORMAL LOW (ref 22–32)
Calcium: 7.4 mg/dL — ABNORMAL LOW (ref 8.9–10.3)
Chloride: 105 mmol/L (ref 98–111)
Creatinine, Ser: 1.18 mg/dL — ABNORMAL HIGH (ref 0.44–1.00)
GFR calc Af Amer: 53 mL/min — ABNORMAL LOW (ref >60)
GFR calc non Af Amer: 45 mL/min — ABNORMAL LOW (ref >60)
Glucose, Bld: 252 mg/dL — ABNORMAL HIGH (ref 70–99)
Potassium: 3.5 mmol/L (ref 3.5–5.1)
Sodium: 135 mmol/L (ref 135–145)

## 2019-06-10 LAB — GLUCOSE, CAPILLARY
Glucose-Capillary: 231 mg/dL — ABNORMAL HIGH (ref 70–99)
Glucose-Capillary: 316 mg/dL — ABNORMAL HIGH (ref 70–99)
Glucose-Capillary: 259 mg/dL — ABNORMAL HIGH (ref 70–99)
Glucose-Capillary: 286 mg/dL — ABNORMAL HIGH (ref 70–99)

## 2019-06-10 MED ORDER — INSULIN GLARGINE 100 UNIT/ML ~~LOC~~ SOLN
15.0000 [IU] | Freq: Every day | SUBCUTANEOUS | Status: DC
  Administered 2019-06-11: 09:00:00 15 [IU] via SUBCUTANEOUS
  Filled 2019-06-10: qty 0.15

## 2019-06-10 MED ORDER — PANTOPRAZOLE SODIUM 40 MG PO TBEC
40.0000 mg | DELAYED_RELEASE_TABLET | Freq: Every day | ORAL | Status: DC
  Administered 2019-06-10 – 2019-06-12 (×3): 40 mg via ORAL
  Filled 2019-06-10 (×3): qty 1

## 2019-06-10 NOTE — Evaluation (Signed)
Occupational Therapy Evaluation Patient Details Name: Claudia Walter MRN: [id number] DOB: Oct 16, 1946 Today's Date: 06/10/2019    History of Present Illness 73 yo female wiht onset of sepsis from UTI was admitted, with complicating renal calculi, in AKI and DKA, and lactic acidosis.  Has been extensively weak with MS, LE involvement being more.  PMHx:  Neurogenic bladder, suprapubic catheter, MS, IBS, DM, R hip fracture with unclear history of type of repair   Clinical Impression   Pt admitted with sepsis from UTI was admitted, with complicating renal calculi, in AKI and DKA, and lactic acidosis. Pt currently with functional limitations due to the deficits listed below (see OT Problem List). Pt at home was requiring assist for all ADLs and sliding board with assist to wheelchair. Pt was educated about the need for rehab and agreeded at this time. Pt requires mod assist +2 to sit at EOB due to extension and tone.  Pt will benefit from skilled OT to increase their safety and independence with ADL and functional mobility for ADL to facilitate discharge to venue listed below.       Follow Up Recommendations  SNF;Supervision/Assistance - 24 hour    Equipment Recommendations       Recommendations for Other Services       Precautions / Restrictions Precautions Precautions: Fall;Other (comment) Precaution Comments: mm tone from MS Restrictions Weight Bearing Restrictions: No      Mobility Bed Mobility Overal bed mobility: Needs Assistance Bed Mobility: Supine to Sit;Sit to Supine     Supine to sit: Max assist;+2 for physical assistance;+2 for safety/equipment Sit to supine: Max assist;+2 for physical assistance;+2 for safety/equipment   General bed mobility comments: pt is too restricted in hips to be able to sit side of bed  Transfers Overall transfer level: Needs assistance               General transfer comment: total assist as she cannot sit well enough to use a sliding  board    Balance Overall balance assessment: Needs assistance;History of Falls Sitting-balance support: Single extremity supported(pt is fully assisted by PT and OT) Sitting balance-Leahy Scale: Zero                                     ADL either performed or assessed with clinical judgement   ADL Overall ADL's : Needs assistance/impaired Eating/Feeding: Set up;Bed level(supported sitting in bed elevated)   Grooming: Wash/dry hands;Minimal assistance;Bed level   Upper Body Bathing: Moderate assistance;Bed level   Lower Body Bathing: Maximal assistance;Bed level   Upper Body Dressing : Moderate assistance;Bed level   Lower Body Dressing: Maximal assistance;Bed level   Toilet Transfer: Total assistance   Toileting- Clothing Manipulation and Hygiene: Maximal assistance;Bed level   Tub/ Shower Transfer: Total assistance(bed level)     General ADL Comments: pt was slidding board PLF and requires mod x2 to sit at [place]      Pertinent Vitals/Pain Pain Assessment: Faces Faces Pain Scale: Hurts even more Pain Location: R hip at end of range with flexion Pain Descriptors / Indicators: Tightness Pain Intervention(s): Repositioned;Monitored during session;Limited activity within patient's tolerance     Hand Dominance     Extremity/Trunk Assessment Upper Extremity Assessment Upper Extremity Assessment: Defer to OT evaluation   Lower Extremity Assessment Lower Extremity Assessment:  Generalized weakness   Cervical / Trunk Assessment Cervical / Trunk Assessment: Kyphotic   Communication Communication Communication: No difficulties   Cognition Arousal/Alertness: Awake/alert Behavior During Therapy: WFL for tasks assessed/performed Overall Cognitive Status: Within Functional Limits for tasks assessed                                 General Comments: pt is able to give details of her care    General Comments  Pt is up to side of bed with assistance and note her difficulty with limited ROM on hips to fully sit up      Exercises Exercises: General Lower Extremity General Exercises - Lower Extremity Ankle Circles/Pumps: AAROM;Both Long Arc Quad: AAROM Heel Slides: AAROM;Both Hip ABduction/ADduction: AAROM;Both Hip Flexion/Marching: AAROM   Shoulder Instructions      Home Living Family/patient expects to be discharged to:: Private residence Living Arrangements: Children;Other relatives Available Help at Discharge: Family;Available 24 hours/day Type of Home: House Home Access: Ramped entrance     Home Layout: One level         Bathroom Toilet: Standard(pt has catheter and uses paper garments for bowel eliminatio)     Home Equipment: Wheelchair - Education administrator (comment)(sliding board)          Prior Functioning/Environment Level of Independence: Needs assistance  Gait / Transfers Assistance Needed: sliding board and then used wheelchair for transfer OOB ADL's / Homemaking Assistance Needed: family cares for pt with bed baths and simple dressing             OT Problem List: Decreased strength;Decreased range of motion;Decreased activity tolerance;Impaired balance (sitting and/or standing);Decreased coordination;Decreased cognition;Decreased safety awareness;Decreased knowledge of use of DME or AE;Pain      OT Treatment/Interventions: Self-care/ADL training;Therapeutic exercise;Neuromuscular education;Therapeutic activities;Patient/family education;Balance training    OT Goals(Current goals can be found in the care plan section) Acute Rehab OT Goals Patient Stated Goal: to move better and go home OT Goal Formulation: With patient Time For Goal Achievement: 06/17/19 Potential to Achieve Goals: Good ADL Goals Pt Will Perform Grooming: with set-up;bed level Pt Will Perform Upper Body Dressing: with min assist;bed level Additional ADL Goal #1: Pt will be  able to sit EOB for 1 min with moderate assist  OT Frequency: Min 2X/week   Barriers to D/C: Other (comment)(due to complexity of case)          Co-evaluation PT/OT/SLP Co-Evaluation/Treatment: Yes Reason for Co-Treatment: Complexity of the patient's impairments (multi-system involvement);For patient/therapist safety PT goals addressed during session: Mobility/safety with mobility;Balance OT goals addressed during session: ADL's and self-care;Proper use of Adaptive equipment and DME;Strengthening/ROM      AM-PAC OT "6 Clicks" Daily Activity     Outcome Measure Help from another person eating meals?: None Help from another person taking care of personal grooming?: A Lot Help from another person toileting, which includes using toliet, bedpan, or urinal?: A Lot Help from another person bathing (including washing, rinsing, drying)?: Total Help from another person to put on and taking off regular upper body clothing?: Total Help from another person to put on and taking off regular lower body clothing?: Total 6 Click Score: 11   End of Session    Activity Tolerance: Patient limited by fatigue;Patient limited by pain(ROM ) Patient left: in bed;with call bell/phone within reach;with bed alarm set  OT Visit Diagnosis: Unsteadiness on feet (R26.81);Repeated falls (R29.6);Other abnormalities of gait and mobility (R26.89);Muscle weakness (generalized) (M62.81);Pain  Pain - part of body: Hip                Time: [id number] OT Time Calculation (min): 41 min Charges:  OT General Charges $OT Visit: 1 Visit OT Evaluation $OT Eval Low Complexity: 1 Low OT Treatments $Self Care/Home Management : 8-22 mins  Joeseph Amor OTR/L  Acute Rehab Services  [phone] office number [phone] pager number   Joeseph Amor 06/10/2019, 1:51 PM

## 2019-06-10 NOTE — Progress Notes (Addendum)
Family Medicine Teaching Service Daily Progress Note Intern Pager: [phone]  Patient name: Claudia Walter Medical record number: [id number] Date of birth: Jan 25, 1946 Age: 73 y.o. Gender: female  Primary Care Provider: Gladys Damme, MD Consultants: Urology Code Status: Full  Pt Overview and Major Events to Date:  Patient admitted for sepsis  Assessment and Plan: Claudia Walter is a 73 year old female presenting with sepsis, altered mental status and DKA.  Past medical history significant for neurogenic bladder, diabetes, multiple sclerosis, overactive bladder, IBS, and HDL.  Urosepsis Lactic acidosis, resolved White count improving.  Patient continues to have fevers.  T-max 101.2 in the last 24 hours last fever was 100.9 at 7:43 AM.  Her vital signs are otherwise within normal limits.  She is mildly tachycardic in the low 100s.  Her blood culture returns with E. coli and is still pending sensitivities.  Urine culture showed multiple species and suggests 3 collection.  This is likely due to being collected from old catheter.  Ideally, patient's catheter to would have been switched prior to urine culture.  However, we will continue to treat the E. coli bacteremia at this time. - Tylenol 650 mg for fever _ Continue cefepime   DKA, resolved  NIDDM, A1C 12.3.  Currently taking sensitive sliding scale 3 times daily with meals and 10 units of Lantus.  Patient's fasting glucose was 316 this morning.  She has received 10 units of NovoLog in the last 24 hours.  Will increase Lantus to 15 units and continue sliding scale NovoLog.  Continue to titrate with blood sugar goals 120-180 while admitted.  _ 15 units lantus  _ sensitive SSI TID WC _ carb modified diet   AKI Likely prerenal. Creatinine today is 1.18 from 1.25.  _ monitor   #Multiple sclerosis Takes baclofen 20mg  BID, gabapentin 600mg  BID -Holding at this time for AMS  _ monitor for pain   #Neurogenic bladder Takes oxybutynin 5mg  once  daily. Unclear why patient taking medicine w/ suprapubic cath  -Continue to hold due to hypotension   #HLD Last lipid panel: 2019,TAGs 249, LDL 159, HDL 41, Chol 250, VLDL 50 _ continue pravastatin  #IBS Takes dicyclomine 10 mg Q8PRN  -Continue to hold  #GERD/hiatus hernia Takes omeprazole 20mg  once daily _ restarted PTX 40mg    #Nutrition  Takes vitamin C 100 mg, cyanocobalamin 75mg  once daily  -Can restart once off insulin drip  #Tobacco misuse disorder _ Continue welbutrin   #Inadequate social support Patient lives with son and wife, who are very caring and supportive.  Have the best interest for the patient.  However patient may benefit from additional help at home in addition to family support.  Family reported that they were feeding patient was lying supine, increased risk of aspiration and perhaps have poor understanding of her chronic conditions and how best to support her.  On multiple occasions during our phone conversation, the son mentioned that she had had similar presentations before and she only required intravenous vitamin C in order to get better and come home. -- SW consulted 06/10/19 -- PT / OT recs for SNF  FEN/GI: Carb modified once off insulin drip PPx: Lovenox  Disposition: Likely SNF pending placement and continued medical improvement  Subjective:  Patient feeling tired this morning.   Objective: Temp:  [98.3 F (36.8 C)-101.2 F (38.4 C)] 100.9 F (38.3 C) (08/15 0743) Pulse Rate:  [102-120] 110 (08/15 0343) Resp:  [18-26] 22 (08/15 0343) BP: (93-146)/(44-117) 139/64 (08/15 0343) SpO2:  [91 %-99 %]  93 % (08/15 0343) Physical Exam: General: NAD, non-toxic, well-appearing, sitting comfortably in bed watching TV HEENT: [place]/AT. PERRLA. EOMI.  Cardiovascular: RRR, normal S1, S2. B/L 2+ RP.  1+ BLEE Respiratory: CTAB. No IWOB.  Abdomen: + BS. NT, ND, soft to palpation.  Extremities: Warm and well perfused. Moving spontaneously.  Soles of feet  are scaly and shedding skin.  No signs of active infection in feet or around toes. Neuro: A & O x4. CN grossly intact. No FND   Laboratory: Recent Labs  Lab 06/09/19 0606 06/09/19 1531 06/10/19 0231  WBC 24.9* 20.6* 15.0*  HGB 12.1 12.5 10.6*  HCT 38.2 38.9 32.7*  PLT 282 234 216   Recent Labs  Lab 06/08/19 2000  06/09/19 1109 06/09/19 1531 06/10/19 0231  NA 130*   < > 135 135 135  K 4.7   < > 3.8 3.7 3.5  CL 93*   < > 106 105 105  CO2 19*   < > 18* 17* 18*  BUN 32*   < > 29* 28* 24*  CREATININE 1.83*   < > 1.27* 1.25* 1.18*  CALCIUM 8.6*   < > 7.4* 7.6* 7.4*  PROT 6.5  --   --   --   --   BILITOT 0.9  --   --   --   --   ALKPHOS 135*  --   --   --   --   ALT 27  --   --   --   --   AST 37  --   --   --   --   GLUCOSE 702*   < > 156* 173* 252*   < > = values in this interval not displayed.    Imaging/Diagnostic Tests: Dg Chest 2 View Result Date: 06/09/2019 IMPRESSION: Minimal left pleural effusion with slight left base atelectasis. Lungs elsewhere clear. Cardiac silhouette within normal limits. Aortic Atherosclerosis (ICD10-I70.0).   Ct Head Wo Contrast Result Date: 06/09/2019 IMPRESSION: Mild atrophy with slight periventricular small vessel disease. No acute infarct evident. No mass or hemorrhage. There are foci of arterial vascular calcification. There is mucosal thickening in several ethmoid air cells. There is probable cerumen in the left external auditory canal.   Dg Chest Portable 1 View Result Date: 06/08/2019  IMPRESSION: Possible tiny left effusion with mild left basilar atelectasis   Ct Renal Stone Study Result Date: 06/08/2019 IMPRESSION: 1. Nonobstructing right renal calculi. No hydronephrosis or obstructing stone. Correlation with urinalysis recommended to exclude UTI. 2. Fatty liver. 3. Moderate colonic stool burden. No bowel obstruction or active inflammation. Normal appendix. 4. Aortic Atherosclerosis (ICD10-I70.0).     Claudia Oliphant, MD 06/10/2019,  8:41 AM PGY-2 [place] Intern pager: [phone], text pages welcome

## 2019-06-10 NOTE — Plan of Care (Signed)
  Problem: Education: Goal: Knowledge of General Education information will improve Description: Including pain rating scale, medication(s)/side effects and non-pharmacologic comfort measures Outcome: Progressing   Problem: Health Behavior/Discharge Planning: Goal: Ability to manage health-related needs will improve Outcome: Progressing   Problem: Skin Integrity: Goal: Risk for impaired skin integrity will decrease Outcome: Progressing  Turn pt q2 and explained to pt why she needs to be turned frequently

## 2019-06-10 NOTE — Evaluation (Signed)
Physical Therapy Evaluation Patient Details Name: Claudia Walter MRN: [id number] DOB: Nov 21, 1945 Today's Date: 06/10/2019   History of Present Illness  73 yo female wiht onset of sepsis from UTI was admitted, with complicating renal calculi, in AKI and DKA, and lactic acidosis.  Has been extensively weak with MS, LE involvement being more.  PMHx:  Neurogenic bladder, suprapubic catheter, MS, IBS, DM, R hip fracture with unclear history of type of repair  Clinical Impression  Pt was seen for mobility and strength testing, and was able to handle minimal control of sitting with 2 person assist.  Talked with her about going to rehab to increase sitting balance control and ROM of legs esp hips in a controlled way to increase independence and safety of all movement.  Pt is in agreement that she needs more help and will benefit from a short rehab stay to increase control of her mobility.  Follow up acutely for these needs with 2 person assist in hosp.  Her extension tone of quads B is significant, and worsened by trunk extension.      Follow Up Recommendations SNF    Equipment Recommendations  None recommended by PT    Recommendations for Other Services       Precautions / Restrictions Precautions Precautions: Fall;Other (comment) Precaution Comments: mm tone from MS Restrictions Weight Bearing Restrictions: No      Mobility  Bed Mobility Overal bed mobility: Needs Assistance Bed Mobility: Supine to Sit;Sit to Supine     Supine to sit: Max assist;+2 for physical assistance;+2 for safety/equipment Sit to supine: Max assist;+2 for physical assistance;+2 for safety/equipment   General bed mobility comments: pt is too restricted in hips to be able to sit side of bed  Transfers Overall transfer level: Needs assistance               General transfer comment: total assist as she cannot sit well enough to use a sliding board  Ambulation/Gait             General Gait Details:  unable   Stairs            Wheelchair Mobility    Modified Rankin (Stroke Patients Only)       Balance Overall balance assessment: Needs assistance;History of Falls Sitting-balance support: Single extremity supported(pt is fully assisted by PT and OT) Sitting balance-Leahy Scale: Zero                                       Pertinent Vitals/Pain Pain Assessment: Faces Faces Pain Scale: Hurts even more Pain Location: R hip at end of range with flexion Pain Descriptors / Indicators: Tightness Pain Intervention(s): Repositioned;Monitored during session;Limited activity within patient's tolerance    Home Living Family/patient expects to be discharged to:: Private residence Living Arrangements: Children;Other relatives Available Help at Discharge: Family;Available 24 hours/day Type of Home: House Home Access: Ramped entrance     Home Layout: One level Home Equipment: Wheelchair - Education administrator (comment)(sliding board)      Prior Function Level of Independence: Needs assistance   Gait / Transfers Assistance Needed: sliding board and then used wheelchair for transfer OOB  ADL's / Homemaking Assistance Needed: family cares for pt with bed baths and simple dressing         Hand Dominance        Extremity/Trunk Assessment   Upper Extremity Assessment Upper Extremity Assessment: Defer to  OT evaluation    Lower Extremity Assessment Lower Extremity Assessment: Generalized weakness    Cervical / Trunk Assessment Cervical / Trunk Assessment: Kyphotic  Communication   Communication: No difficulties  Cognition Arousal/Alertness: Awake/alert Behavior During Therapy: WFL for tasks assessed/performed Overall Cognitive Status: Within Functional Limits for tasks assessed                                 General Comments: pt is able to give details of her care      General Comments General comments (skin integrity, edema, etc.): Pt is  up to side of bed with assistance and note her difficulty with limited ROM on hips to fully sit up      Exercises General Exercises - Lower Extremity Ankle Circles/Pumps: AAROM;Both Long Arc Quad: AAROM Heel Slides: AAROM;Both Hip ABduction/ADduction: AAROM;Both Hip Flexion/Marching: AAROM   Assessment/Plan    PT Assessment Patient needs continued PT services  PT Problem List Decreased strength;Decreased range of motion;Decreased activity tolerance;Decreased balance;Decreased mobility;Decreased coordination;Decreased safety awareness;Decreased knowledge of precautions;Cardiopulmonary status limiting activity;Obesity;Pain       PT Treatment Interventions DME instruction;Functional mobility training;Therapeutic activities;Therapeutic exercise;Balance training;Neuromuscular re-education;Patient/family education    PT Goals (Current goals can be found in the Care Plan section)  Acute Rehab PT Goals Patient Stated Goal: to move better and go home PT Goal Formulation: With patient Time For Goal Achievement: 06/24/19 Potential to Achieve Goals: Good    Frequency Min 2X/week   Barriers to discharge Decreased caregiver support home with family but requires 2 person assist to transfer and sit up    Co-evaluation PT/OT/SLP Co-Evaluation/Treatment: Yes Reason for Co-Treatment: Complexity of the patient's impairments (multi-system involvement);For patient/therapist safety PT goals addressed during session: Mobility/safety with mobility;Balance OT goals addressed during session: ADL's and self-care;Proper use of Adaptive equipment and DME;Strengthening/ROM       AM-PAC PT "6 Clicks" Mobility  Outcome Measure Help needed turning from your back to your side while in a flat bed without using bedrails?: A Lot Help needed moving from lying on your back to sitting on the side of a flat bed without using bedrails?: Total Help needed moving to and from a bed to a chair (including a  wheelchair)?: Total Help needed standing up from a chair using your arms (e.g., wheelchair or bedside chair)?: Total Help needed to walk in hospital room?: Total Help needed climbing 3-5 steps with a railing? : Total 6 Click Score: 7    End of Session   Activity Tolerance: Patient limited by pain;Treatment limited secondary to medical complications (Comment)(mm spasm and weakness of LE's) Patient left: in bed;with call bell/phone within reach;with bed alarm set Nurse Communication: Mobility status PT Visit Diagnosis: Muscle weakness (generalized) (M62.81);Difficulty in walking, not elsewhere classified (R26.2);Other symptoms and signs involving the nervous system (R29.898);Pain Pain - Right/Left: Right Pain - part of body: Hip    Time: [id number] PT Time Calculation (min) (ACUTE ONLY): 41 min   Charges:   PT Evaluation $PT Eval High Complexity: 1 High         Ramond Dial 06/10/2019, 1:46 PM   Mee Hives, PT MS Acute Rehab Dept. Number: [place] and [place]

## 2019-06-10 NOTE — Progress Notes (Signed)
Patient has rash of small dark colored circles on lower back, no itching or burning, been in place for about a month. Family wanted MD to take a look at it in case there is something they can prescribe for it. Noted during bed bath.  Spoke with family about blister on buttock and educated family on patients need to be turned frequently to prevent further skin breakdown.

## 2019-06-11 DIAGNOSIS — N10 Acute tubulo-interstitial nephritis: Secondary | ICD-10-CM

## 2019-06-11 DIAGNOSIS — E1110 Type 2 diabetes mellitus with ketoacidosis without coma: Secondary | ICD-10-CM

## 2019-06-11 LAB — BASIC METABOLIC PANEL
Anion gap: 12 (ref 5–15)
BUN: 18 mg/dL (ref 8–23)
CO2: 22 mmol/L (ref 22–32)
Calcium: 7.8 mg/dL — ABNORMAL LOW (ref 8.9–10.3)
Chloride: 102 mmol/L (ref 98–111)
Creatinine, Ser: 0.91 mg/dL (ref 0.44–1.00)
GFR calc Af Amer: >60 mL/min (ref >60)
GFR calc non Af Amer: >60 mL/min (ref >60)
Glucose, Bld: 257 mg/dL — ABNORMAL HIGH (ref 70–99)
Potassium: 3.1 mmol/L — ABNORMAL LOW (ref 3.5–5.1)
Sodium: 136 mmol/L (ref 135–145)

## 2019-06-11 LAB — CULTURE, BLOOD (ROUTINE X 2)

## 2019-06-11 LAB — GLUCOSE, CAPILLARY
Glucose-Capillary: 267 mg/dL — ABNORMAL HIGH (ref 70–99)
Glucose-Capillary: 223 mg/dL — ABNORMAL HIGH (ref 70–99)
Glucose-Capillary: 248 mg/dL — ABNORMAL HIGH (ref 70–99)
Glucose-Capillary: 297 mg/dL — ABNORMAL HIGH (ref 70–99)

## 2019-06-11 LAB — CBC
HCT: 33 % — ABNORMAL LOW (ref 36.0–46.0)
Hemoglobin: 10.8 g/dL — ABNORMAL LOW (ref 12.0–15.0)
MCH: 26.7 pg (ref 26.0–34.0)
MCHC: 32.7 g/dL (ref 30.0–36.0)
MCV: 81.5 fL (ref 80.0–100.0)
Platelets: 235 10*3/uL (ref 150–400)
RBC: 4.05 MIL/uL (ref 3.87–5.11)
RDW: 14.1 % (ref 11.5–15.5)
WBC: 8.5 10*3/uL (ref 4.0–10.5)
nRBC: 0 % (ref 0.0–0.2)

## 2019-06-11 MED ORDER — POTASSIUM CHLORIDE CRYS ER 20 MEQ PO TBCR
40.0000 meq | EXTENDED_RELEASE_TABLET | Freq: Two times a day (BID) | ORAL | Status: AC
  Administered 2019-06-11 (×2): 40 meq via ORAL
  Filled 2019-06-11 (×2): qty 2

## 2019-06-11 MED ORDER — WHITE PETROLATUM EX OINT
TOPICAL_OINTMENT | CUTANEOUS | Status: AC
  Administered 2019-06-11: 23:00:00
  Filled 2019-06-11: qty 28.35

## 2019-06-11 MED ORDER — CEPHALEXIN 250 MG PO CAPS
250.0000 mg | ORAL_CAPSULE | Freq: Four times a day (QID) | ORAL | Status: DC
  Administered 2019-06-11 – 2019-06-12 (×3): 250 mg via ORAL
  Filled 2019-06-11 (×6): qty 1

## 2019-06-11 MED ORDER — GABAPENTIN 600 MG PO TABS
600.0000 mg | ORAL_TABLET | Freq: Two times a day (BID) | ORAL | Status: DC
  Administered 2019-06-11 – 2019-06-12 (×3): 600 mg via ORAL
  Filled 2019-06-11 (×3): qty 1

## 2019-06-11 MED ORDER — BACLOFEN 10 MG PO TABS
10.0000 mg | ORAL_TABLET | Freq: Two times a day (BID) | ORAL | Status: DC
  Administered 2019-06-11 – 2019-06-12 (×3): 10 mg via ORAL
  Filled 2019-06-11 (×3): qty 1

## 2019-06-11 MED ORDER — PNEUMOCOCCAL VAC POLYVALENT 25 MCG/0.5ML IJ INJ
0.5000 mL | INJECTION | INTRAMUSCULAR | Status: AC
  Administered 2019-06-12: 0.5 mL via INTRAMUSCULAR
  Filled 2019-06-11: qty 0.5

## 2019-06-11 MED ORDER — INSULIN GLARGINE 100 UNIT/ML ~~LOC~~ SOLN
18.0000 [IU] | Freq: Every day | SUBCUTANEOUS | Status: DC
  Administered 2019-06-12: 18 [IU] via SUBCUTANEOUS
  Filled 2019-06-11 (×2): qty 0.18

## 2019-06-11 MED ORDER — MIRABEGRON ER 50 MG PO TB24
50.0000 mg | ORAL_TABLET | Freq: Every day | ORAL | Status: DC
  Administered 2019-06-11 – 2019-06-12 (×2): 50 mg via ORAL
  Filled 2019-06-11 (×2): qty 1

## 2019-06-11 NOTE — Plan of Care (Signed)

## 2019-06-11 NOTE — Progress Notes (Signed)
Family Medicine Teaching Service Daily Progress Note Intern Pager: [phone]  Patient name: Claudia Walter Medical record number: [id number] Date of birth: 03-22-1946 Age: 73 y.o. Gender: female  Primary Care Provider: Gladys Damme, MD Consultants: Urology Code Status: Full  Pt Overview and Major Events to Date:  Patient admitted for sepsis  Assessment and Plan: Claudia Walter is a 73 year old female presenting with sepsis, altered mental status and DKA.  Past medical history significant for neurogenic bladder, diabetes, multiple sclerosis, overactive bladder, IBS, and HDL.  Urosepsis Lactic acidosis, resolved White count improving.  Patient continues to have fevers.  T-max 100.9 in the last 24 hours.  100.9 was at 7:43 AM on 8/15.  Her vital signs are otherwise within normal limits.  She is mildly tachycardic in the low 100s.  Her blood culture returns with E. coli and is still pending sensitivities.  Urine culture showed multiple species and suggests 3 collection.  This is likely due to being collected from old catheter.  Ideally, patient's catheter to would have been switched prior to urine culture.  However, we will continue to treat the E. coli bacteremia at this time.  WBCs 25 >20.6>15.0>8.5 - Tylenol 650 mg for fever -Discontinue ceftriaxone and initiate p.o. Keflex 250 every 6 hours   DKA, resolved  NIDDM, A1C 12.3.  Currently taking sensitive sliding scale 3 times daily with meals and 15 units of Lantus.  Patient's fasting glucose was 267 this morning.  She has received 15 units of NovoLog in the last 24 hours.  We will follow glucose today because she is already received her morning Lantus and adjust appropriately.  Continue to titrate with blood sugar goals 120-180 while admitted.   _ 15 units lantus  _ sensitive SSI TID WC _ carb modified diet   AKI Likely prerenal. Creatinine today is 1.18 from 1.25.  -Monitor   Elevated blood pressures while hospitalized Patient has had  elevated blood pressures up to 167/87 - Follow-up with PCP for further investigation into possible hypertension  #Multiple sclerosis Takes baclofen 20mg  BID, gabapentin 600mg  BID -Restart baclofen 20 mg twice daily and gabapentin 600 mg twice daily -Monitor for pain   #Neurogenic bladder Takes Myrbetriq. -Continue Myrbetriq   #HLD Last lipid panel: 2019,TAGs 249, LDL 159, HDL 41, Chol 250, VLDL 50 -Continue pravastatin  #IBS Takes dicyclomine 10 mg Q8PRN  -Continue to hold  #GERD/hiatus hernia Takes omeprazole 20mg  once daily -Restarted PTX 40mg    #Nutrition  Takes vitamin C 100 mg, cyanocobalamin 75mg  once daily  -Can restart once off insulin drip  #Tobacco misuse disorder _ Continue welbutrin   #Inadequate social support Patient lives with son and wife, who are very caring and supportive.  Have the best interest for the patient.  However patient may benefit from additional help at home in addition to family support.  Family reported that they were feeding patient was lying supine, increased risk of aspiration and perhaps have poor understanding of her chronic conditions and how best to support her.  On multiple occasions during our phone conversation, the son mentioned that she had had similar presentations before and she only required intravenous vitamin C in order to get better and come home. -- SW consulted 06/10/19 -- PT / OT recs for SNF  FEN/GI: Carb modified once off insulin drip PPx: Lovenox  Disposition: Likely SNF pending placement and continued medical improvement  Subjective:  Patient is doing well this morning.  Reports concerns over her blood sugars coming back up over the  last few days.  Patient reports anxiety over the new insulin requirement for her diabetes.  She also is wondering why she is not on her home baclofen and Neurontin.  Patient reports that she had right leg spasms overnight.  Objective: Temp:  [98.3 F (36.8 C)-100.9 F (38.3 C)]  99 F (37.2 C) (08/16 0353) Pulse Rate:  [97-113] 99 (08/16 0353) Resp:  [17-27] 17 (08/16 0353) BP: (147-167)/(72-86) 167/80 (08/16 0353) SpO2:  [93 %-99 %] 95 % (08/16 0353) Physical Exam: General: NAD, non-toxic, well-appearing, laying comfortably in bed.  Nurses in the room on evaluation. HEENT: [place]/AT. PERRLA. EOMI.  Cardiovascular: RRR, normal S1, S2. B/L 2+ RP.  1+ BLEE Respiratory: CTAB. No IWOB.  Abdomen: + BS. NT, ND, soft to palpation.  Patient with suprapubic cath in place.  Dressings appear clean. Extremities: Warm and well perfused. Moving spontaneously.  Soles of feet are scaly and shedding skin.  No signs of active infection in feet or around toes. Neuro: A & O x4. CN grossly intact. No FND   Laboratory: Recent Labs  Lab 06/09/19 0606 06/09/19 1531 06/10/19 0231  WBC 24.9* 20.6* 15.0*  HGB 12.1 12.5 10.6*  HCT 38.2 38.9 32.7*  PLT 282 234 216   Recent Labs  Lab 06/08/19 2000  06/09/19 1109 06/09/19 1531 06/10/19 0231  NA 130*   < > 135 135 135  K 4.7   < > 3.8 3.7 3.5  CL 93*   < > 106 105 105  CO2 19*   < > 18* 17* 18*  BUN 32*   < > 29* 28* 24*  CREATININE 1.83*   < > 1.27* 1.25* 1.18*  CALCIUM 8.6*   < > 7.4* 7.6* 7.4*  PROT 6.5  --   --   --   --   BILITOT 0.9  --   --   --   --   ALKPHOS 135*  --   --   --   --   ALT 27  --   --   --   --   AST 37  --   --   --   --   GLUCOSE 702*   < > 156* 173* 252*   < > = values in this interval not displayed.    Imaging/Diagnostic Tests: Dg Chest 2 View Result Date: 06/09/2019 IMPRESSION: Minimal left pleural effusion with slight left base atelectasis. Lungs elsewhere clear. Cardiac silhouette within normal limits. Aortic Atherosclerosis (ICD10-I70.0).   Ct Head Wo Contrast Result Date: 06/09/2019 IMPRESSION: Mild atrophy with slight periventricular small vessel disease. No acute infarct evident. No mass or hemorrhage. There are foci of arterial vascular calcification. There is mucosal thickening in  several ethmoid air cells. There is probable cerumen in the left external auditory canal.   Dg Chest Portable 1 View Result Date: 06/08/2019  IMPRESSION: Possible tiny left effusion with mild left basilar atelectasis   Ct Renal Stone Study Result Date: 06/08/2019 IMPRESSION: 1. Nonobstructing right renal calculi. No hydronephrosis or obstructing stone. Correlation with urinalysis recommended to exclude UTI. 2. Fatty liver. 3. Moderate colonic stool burden. No bowel obstruction or active inflammation. Normal appendix. 4. Aortic Atherosclerosis (ICD10-I70.0).     Gifford Shave, MD 06/11/2019, 6:24 AM PGY-2 [facility] Intern pager: [phone], text pages welcome

## 2019-06-11 NOTE — NC FL2 (Signed)
[place] MEDICAID FL2 LEVEL OF CARE SCREENING TOOL     IDENTIFICATION  Patient Name: Claudia Walter Birthdate: Mar 20, 1946 Sex: female Admission Date (Current Location): 06/08/2019  [facility] and [place] Number:  Herbalist and Address:  [facility], [place] [address], [place], [place] [address]      Provider Number: [id number]  Attending Physician Name and Address:  McDiarmid, Blane Ohara, MD  Relative Name and Phone Number:  Corley Luke,   [phone]    Current Level of Care: Hospital Recommended Level of Care: [place] Prior Approval Number:    Date Approved/Denied:   PASRR Number: [id number]  Discharge Plan: SNF    Current Diagnoses: Patient Active Problem List   Diagnosis Date Noted  . Pressure injury of skin 06/09/2019  . Catheter-associated urinary tract infection ([place]) 06/09/2019  . AKI (acute kidney injury) ([place])   . E coli bacteremia   . Diabetic ketoacidosis without coma associated with type 2 diabetes mellitus ([place])   . Sepsis ([place]) 06/08/2019  . Abnormality of gait 11/16/2018  . Inadequate social support 09/06/2018  . Needs assistance with community resources 09/05/2018  . Gastroesophageal reflux disease   . Gastric ulcer without hemorrhage or perforation   . Hiatal hernia 12/15/2017  . Anemia 12/15/2017  . Multiple sclerosis ([place]) 12/14/2017  . Neurogenic bladder 12/14/2017  . Suprapubic catheter ([place]) 12/14/2017  . Osteoporosis 12/14/2017  . Type 2 diabetes mellitus ([place]) 12/14/2017  . Hyperlipidemia 12/14/2017    Orientation RESPIRATION BLADDER Height & Weight     Time, Self, Situation, Place  Normal Indwelling catheter, Continent Weight: 156 lb 12 oz (71.1 kg) Height:  5\' 2"  (157.5 cm)  BEHAVIORAL SYMPTOMS/MOOD NEUROLOGICAL BOWEL NUTRITION STATUS      Continent Diet(carb modified)  AMBULATORY STATUS COMMUNICATION OF NEEDS Skin   Extensive Assist Verbally Normal                       Personal  Care Assistance Level of Assistance  Bathing, Feeding, Dressing Bathing Assistance: Maximum assistance Feeding assistance: Independent Dressing Assistance: Maximum assistance     Functional Limitations Info  Sight, Hearing, Speech Sight Info: Adequate Hearing Info: Adequate Speech Info: Adequate    SPECIAL CARE FACTORS FREQUENCY  PT (By licensed PT), OT (By licensed OT)     PT Frequency: 5x wk OT Frequency: 5x wk            Contractures Contractures Info: Not present    Additional Factors Info  Code Status, Allergies Code Status Info: full code Allergies Info: Sulfa Antibiotics           Current Medications (06/11/2019):  This is the current hospital active medication list Current Facility-Administered Medications  Medication Dose Route Frequency Provider Last Rate Last Dose  . acetaminophen (TYLENOL) tablet 650 mg  650 mg Oral Q6H PRN Gifford Shave, MD   650 mg at 06/09/19 1635  . buPROPion [facility]) tablet 75 mg  75 mg Oral Daily Enid Derry, [place], DO   75 mg at 06/11/19 0834  . cefTRIAXone (ROCEPHIN) 2 g in sodium chloride 0.9 % 100 mL IVPB  2 g Intravenous Q24H Gifford Shave, MD 200 mL/hr at 06/10/19 2015 2 g at 06/10/19 2015  . enoxaparin (LOVENOX) injection 30 mg  30 mg Subcutaneous Daily Matilde Haymaker, MD   30 mg at 06/11/19 0837  . insulin aspart (novoLOG) injection 0-9 Units  0-9 Units Subcutaneous TID WC Shirley, [place], DO   5  Units at 06/11/19 0836  . insulin glargine (LANTUS) injection 15 Units  15 Units Subcutaneous Daily Wilber Oliphant, MD   15 Units at 06/11/19 [phone]  . pantoprazole (PROTONIX) EC tablet 40 mg  40 mg Oral Daily Wilber Oliphant, MD   40 mg at 06/11/19 0834  . potassium chloride SA (K-DUR) CR tablet 40 mEq  40 mEq Oral BID Shirley, [place], DO      . pravastatin (PRAVACHOL) tablet 20 mg  20 mg Oral q1800 Shirley, [place], DO   20 mg at 06/10/19 2013     Discharge Medications: Please see discharge summary for a list of discharge  medications.  Relevant Imaging Results:  Relevant Lab Results:   Additional Information SS# [id number]  Wende Neighbors, LCSW

## 2019-06-11 NOTE — Discharge Summary (Signed)
[facility] Discharge Summary  Patient name: Claudia Walter Medical record number: [id number] Date of birth: 06-May-1946 Age: 73 y.o. Gender: female Date of Admission: 06/08/2019  Date of Discharge: 06/11/2019 Admitting Physician: Zenia Resides, MD  Primary Care Provider: Gladys Damme, MD Consultants:  Indication for Hospitalization: Sepsis/AMS/DKA  Discharge Diagnoses/Problem List:  Urosepsis-resolved Lactic acidosis DKA-resolved Non-insulin-dependent diabetes mellitus AKI-resolved Elevated blood pressure while hospitalized Multiple sclerosis Neurogenic bladder Hyperlipidemia IBS GERD/hiatal hernia Nutrition Tobacco misuse disorder Inadequate social support  Disposition: Home.  SNF was recommended but family preferred for her to come home  Discharge Condition: Stable  Discharge Exam:  Temp:  [98.2 F (36.8 C)-99.1 F (37.3 C)] 98.2 F (36.8 C) (08/17 0407) Pulse Rate:  [92-101] 92 (08/17 0407) Resp:  [14-27] 15 (08/17 0407) BP: (137-169)/(72-95) 161/87 (08/17 0407) SpO2:  [96 %-98 %] 96 % (08/17 0407) Physical Exam: General: NAD, non-toxic, laying resting comfortably when I enter the room HEENT: [place]/AT. PERRLA. EOMI.  Cardiovascular: RRR, normal S1, S2. B/L 2+ RP.  1+ BLEE Respiratory: CTAB. No IWOB.  Abdomen: + BS. NT, ND, soft to palpation.  Patient with suprapubic cath in place.  Dressings appear clean. Extremities: Warm and well perfused. Moving spontaneously.  Soles of feet are scaly and shedding skin.  No signs of active infection in feet or around toes decreased lower extremity swelling Neuro: A & O x4. CN grossly intact. No FND  Brief Hospital Course:  Claudia Walter is a 73 year old female presenting with sepsis, altered mental status and DKA.  Past medical history is significant for neurogenic bladder, diabetes, MS, overactive bladder, IBS, HLD.    Urosepsis On admission patient's WBC was 25.  Her lactic acid was 2.8 which  increased to 3.7 and then to 3.9.  She was tachycardic and tachypneic.  It was also found on work-up that the patient had a 15 mm nonobstructing calculus in her right renal upper pole of her right kidney.  Patient does have a suprapubic cath which is believed to be the source of her elevated WBC.  Patient had blood cultures drawn as well as urine cultures.  Urine cultures grew E. coli and Enterobacter.  It was believed that the Enterobacter was a contaminant and that the true culprit of the sepsis was the E. coli.  Patient was initially started on ceftriaxone while cultures were pending.  She was changed to vancomycin and then switched back to ceftriaxone after the cultures grew E. coli.  Patient's WBC continued to improve on ceftriaxone.  It was trended from 25-20.6 to 15-8.5.  She received 3 days of ceftriaxone and was transitioned to p.o. Keflex 500 mg 4 times daily for 7 days outpatient which is a total of 10 days of treatment.  Before discharge her suprapubic catheter was leaking so it was changed for a new one.  DKA Blood sugar on admission was elevated at 702.  Patient had a anion gap of 18.  She had altered mental status.  An insulin drip was started and she received 4 L of normal saline and then was started on a drip of D5 half-normal saline at 75 mL an hour.  She also received 4 runs of potassium.  CBGs were trended and she became more alert and oriented as they became more controlled.  Patient was switched from n.p.o. to carb modified diet started initially on Lantus 10 units daily with NovoLog since today for correction.  On day 3 of admission patient NovoLog was increased to 15  units and then on day 4 it was increased to 18 units.  Patient's A1c was checked and was 12.3 from 6.5 on 12/14/2017.  Patient was discharged on 15 units of Lantus with close follow-up.  Issues for Follow Up:  1. Follow-up with primary care due to patient's A1c and now requiring insulin. Started on Lantus 18 units while  admitted.  Discharged on 15 units.  Needs continued diabetic education.  2. Elevated BP while admitted, please ensure improvement and may need to onsider adding an ARB 3. Restarted metformin at 500mg  daily, will need titrating up   Significant Procedures: 8/17-patient suprapubic catheter exchanged  Significant Labs and Imaging:  Recent Labs  Lab 06/09/19 1531 06/10/19 0231 06/11/19 1011  WBC 20.6* 15.0* 8.5  HGB 12.5 10.6* 10.8*  HCT 38.9 32.7* 33.0*  PLT 234 216 235   Recent Labs  Lab 06/08/19 2000  06/09/19 0606 06/09/19 1109 06/09/19 1531 06/10/19 0231 06/11/19 1011  NA 130*   < > 138 135 135 135 136  K 4.7   < > 4.5 3.8 3.7 3.5 3.1*  CL 93*   < > 108 106 105 105 102  CO2 19*   < > 20* 18* 17* 18* 22  GLUCOSE 702*   < > 164* 156* 173* 252* 257*  BUN 32*   < > 28* 29* 28* 24* 18  CREATININE 1.83*   < > 1.45* 1.27* 1.25* 1.18* 0.91  CALCIUM 8.6*   < > 7.5* 7.4* 7.6* 7.4* 7.8*  ALKPHOS 135*  --   --   --   --   --   --   AST 37  --   --   --   --   --   --   ALT 27  --   --   --   --   --   --   ALBUMIN 2.8*  --   --   --   --   --   --    < > = values in this interval not displayed.   Troponin trended 8/13 and 8/14- 48> 61> 41> 45 Lactic acid trended 8/13 and 8/14- 2.8> 3.7> 3.9> 3.8> 1.7 PT-15.2 INR 1.2 APTT 24 Results for COURTANY, HAIDER (MRN [id number]) as of 06/13/2019 17:28  Ref. Range 06/08/2019 21:13  URINALYSIS, ROUTINE W REFLEX MICROSCOPIC Unknown Rpt (A)  Appearance Latest Ref Range: CLEAR  HAZY (A)  Bilirubin Urine Latest Ref Range: NEGATIVE  NEGATIVE  Color, Urine Latest Ref Range: YELLOW  YELLOW  Glucose, UA Latest Ref Range: NEGATIVE mg/dL >=500 (A)  Hgb urine dipstick Latest Ref Range: NEGATIVE  LARGE (A)  Ketones, ur Latest Ref Range: NEGATIVE mg/dL 80 (A)  Leukocytes,Ua Latest Ref Range: NEGATIVE  SMALL (A)  Nitrite Latest Ref Range: NEGATIVE  POSITIVE (A)  pH Latest Ref Range: 5.0 - 8.0  5.0  Protein Latest Ref Range: NEGATIVE mg/dL 100 (A)   Specific Gravity, Urine Latest Ref Range: 1.005 - 1.030  1.023  Bacteria, UA Latest Ref Range: NONE SEEN  MANY (A)      Ref Range & Units 5d ago  Enterococcus species NOT DETECTED NOT DETECTED   Listeria monocytogenes NOT DETECTED NOT DETECTED   Staphylococcus species NOT DETECTED NOT DETECTED   Staphylococcus aureus (BCID) NOT DETECTED NOT DETECTED   Streptococcus species NOT DETECTED NOT DETECTED   Streptococcus agalactiae NOT DETECTED NOT DETECTED   Streptococcus pneumoniae NOT DETECTED NOT DETECTED   Streptococcus pyogenes NOT DETECTED NOT DETECTED  Acinetobacter baumannii NOT DETECTED NOT DETECTED   Enterobacteriaceae species NOT DETECTED DETECTEDAbnormal    Comment: Enterobacteriaceae represent a large family of gram-negative bacteria, not a single organism.  CRITICAL RESULT CALLED TO, READ BACK BY AND VERIFIED WITH:  A. Lippucci PharmD 14:25 06/09/19 (wilsonm)   Enterobacter cloacae complex NOT DETECTED NOT DETECTED   Escherichia coli NOT DETECTED DETECTEDAbnormal    Comment: CRITICAL RESULT CALLED TO, READ BACK BY AND VERIFIED WITH:  A. Lippucci PharmD 14:25 06/09/19 (wilsonm)   Klebsiella oxytoca NOT DETECTED NOT DETECTED   Klebsiella pneumoniae NOT DETECTED NOT DETECTED   Proteus species NOT DETECTED NOT DETECTED   Serratia marcescens NOT DETECTED NOT DETECTED   Carbapenem resistance NOT DETECTED NOT DETECTED   Haemophilus influenzae NOT DETECTED NOT DETECTED   Neisseria meningitidis NOT DETECTED NOT DETECTED   Pseudomonas aeruginosa NOT DETECTED NOT DETECTED   Candida albicans NOT DETECTED NOT DETECTED   Candida glabrata NOT DETECTED NOT DETECTED   Candida krusei NOT DETECTED NOT DETECTED   Candida parapsilosis NOT DETECTED NOT DETECTED   Candida tropicalis NOT DETECTED NOT DETECTED       Escherichia coli (ZZ00)  Antibiotic Interpretation Microscan Method Status  AMPICILLIN Resistant >=32 RESISTANT MIC Final  CEFAZOLIN Sensitive <=4 SENSITIVE MIC Final  CEFEPIME  Sensitive <=1 SENSITIVE MIC Final  CEFTAZIDIME Sensitive <=1 SENSITIVE MIC Final  CEFTRIAXONE Sensitive <=1 SENSITIVE MIC Final  CIPROFLOXACIN Resistant >=4 RESISTANT MIC Final  GENTAMICIN Sensitive <=1 SENSITIVE MIC Final  IMIPENEM Sensitive <=0.25 SENSITIVE MIC Final  TRIMETH/SULFA Sensitive <=20 SENSITIVE MIC Final  AMPICILLIN/SULBACTAM Intermediate 16 INTERMEDIATE MIC Final  PIP/TAZO Sensitive <=4 SENSITIVE MIC Final  Extended ESBL Sensitive NEGATIVE MIC Final     Dg Chest 2 View  Result Date: 06/09/2019 CLINICAL DATA:  Shortness of breath EXAM: CHEST - 2 VIEW COMPARISON:  June 08, 2019 FINDINGS: There is a minimal left pleural effusion with slight left base atelectasis. The lungs elsewhere are clear. Heart size and pulmonary vascularity are normal. No adenopathy. There is aortic atherosclerosis. No bone lesions. IMPRESSION: Minimal left pleural effusion with slight left base atelectasis. Lungs elsewhere clear. Cardiac silhouette within normal limits. Aortic Atherosclerosis (ICD10-I70.0). Electronically Signed   By: Lowella Grip III M.D.   On: 06/09/2019 08:47   Ct Head Wo Contrast  Result Date: 06/09/2019 CLINICAL DATA:  Altered mental status.  Hyperglycemia EXAM: CT HEAD WITHOUT CONTRAST TECHNIQUE: Contiguous axial images were obtained from the base of the skull through the vertex without intravenous contrast. COMPARISON:  None. FINDINGS: Brain: There is mild diffuse atrophy. There is no intracranial mass hemorrhage, extra-axial fluid collection, or midline shift. There is slight small vessel disease in the centra semiovale bilaterally. Elsewhere brain parenchyma appears unremarkable. No acute infarct evident. Vascular: There is no hyperdense vessel. There is calcification in each carotid siphon region. Skull: The bony calvarium appears intact. Sinuses/Orbits: There is mucosal thickening in several ethmoid air cells. Other paranasal sinuses are clear. Orbits appear symmetric  bilaterally. Other: Mastoid air cells are clear. There is debris in the left external auditory canal. IMPRESSION: Mild atrophy with slight periventricular small vessel disease. No acute infarct evident. No mass or hemorrhage. There are foci of arterial vascular calcification. There is mucosal thickening in several ethmoid air cells. There is probable cerumen in the left external auditory canal. Electronically Signed   By: Lowella Grip III M.D.   On: 06/09/2019 08:46   Dg Chest Portable 1 View  Result Date: 06/08/2019 CLINICAL DATA:  Altered mental status EXAM:  PORTABLE CHEST 1 VIEW COMPARISON:  None. FINDINGS: Linear atelectasis left base. Possible tiny left effusion. Normal heart size. Aortic atherosclerosis. No pneumothorax. IMPRESSION: Possible tiny left effusion with mild left basilar atelectasis Electronically Signed   By: Donavan Foil M.D.   On: 06/08/2019 20:21   Ct Renal Stone Study  Result Date: 06/08/2019 CLINICAL DATA:  73 year old female with altered mental status and flank pain. Concern for kidney stone. EXAM: CT ABDOMEN AND PELVIS WITHOUT CONTRAST TECHNIQUE: Multidetector CT imaging of the abdomen and pelvis was performed following the standard protocol without IV contrast. COMPARISON:  None. FINDINGS: Evaluation of this exam is limited in the absence of intravenous contrast. Lower chest: Minimal bibasilar dependent atelectatic changes. Trace bilateral pleural effusions with present. Atherosclerotic calcification of the mitral annulus noted. There is trace pericardial effusion. There is hypoattenuation of the cardiac blood pool suggestive of a degree of anemia. Clinical correlation is recommended. No intra-abdominal free air or free fluid. Hepatobiliary: There is diffuse fatty infiltration of the liver. No intrahepatic biliary ductal dilatation. An ill-defined 10 mm hypodense focus in the central liver is not characterized but may represent a cyst. No calcified gallstone or  pericholecystic fluid. Pancreas: Unremarkable. No pancreatic ductal dilatation or surrounding inflammatory changes. Spleen: Normal in size without focal abnormality. Adrenals/Urinary Tract: The adrenal glands are unremarkable. There is a large right renal upper pole nonobstructing calculus measuring approximately 15 mm. Several additional nonobstructing calculi noted in the right kidney. There is no hydronephrosis. There is mild right perinephric stranding. Correlation with urinalysis recommended to exclude UTI. There is no hydronephrosis or nephrolithiasis on the left. Subcentimeter exophytic left renal interpolar hypodense lesion is not characterized on this CT. The visualized ureters are unremarkable. The urinary bladder is decompressed around a suprapubic catheter. Stomach/Bowel: There is a small hiatal hernia. There is moderate stool throughout the colon. No bowel obstruction or active inflammation. The appendix is unremarkable as visualized. Vascular/Lymphatic: Mild atherosclerotic calcification of the aorta. The IVC is unremarkable. No portal venous gas. There is no adenopathy. Reproductive: The uterus and ovaries are grossly unremarkable. Calcification along the right adnexal vascular pedicle noted. Other: There is diffuse subcutaneous edema. Diffuse fatty atrophy of the abdominal and pelvic musculature. Musculoskeletal: Absent right femoral head with chronic deformity and heterotopic bone formation in the right hip. Severe left hip osteoarthritis. Advanced osteopenia. Multilevel age indeterminate, old-appearing compression fractures involving L1, L3, L5. No retropulsed fragment. No definite acute fracture. IMPRESSION: 1. Nonobstructing right renal calculi. No hydronephrosis or obstructing stone. Correlation with urinalysis recommended to exclude UTI. 2. Fatty liver. 3. Moderate colonic stool burden. No bowel obstruction or active inflammation. Normal appendix. 4. Aortic Atherosclerosis (ICD10-I70.0).  Electronically Signed   By: Anner Crete M.D.   On: 06/08/2019 23:02     Results/Tests Pending at Time of Discharge:   Discharge Medications:  Allergies as of 06/13/2019      Reactions   Sulfa Antibiotics Nausea And Vomiting      Medication List    TAKE these medications   baclofen 20 MG tablet Commonly known as: LIORESAL TAKE 1 TABLET(20 MG) BY MOUTH TWICE DAILY What changed: See the new instructions.   buPROPion 75 MG tablet Commonly known as: WELLBUTRIN Take 1 tablet (75 mg total) by mouth daily.   cephALEXin 500 MG capsule Commonly known as: KEFLEX Take 1 capsule (500 mg total) by mouth 4 (four) times daily for 27 doses.   dicyclomine 10 MG capsule Commonly known as: BENTYL Take 1 capsule (10 mg total)  by mouth every 8 (eight) hours as needed for spasms.   gabapentin 300 MG capsule Commonly known as: NEURONTIN Take 2 capsules (600 mg total) by mouth 2 (two) times daily. What changed: when to take this   ibuprofen 600 MG tablet Commonly known as: ADVIL Take 1 tablet (600 mg total) by mouth daily as needed. What changed:   when to take this  additional instructions   Lantus SoloStar 100 UNIT/ML Solostar Pen Generic drug: Insulin Glargine Inject 15 Units into the skin daily.   LUTEIN PO Take 1 tablet by mouth daily.   metFORMIN 500 MG tablet Commonly known as: Glucophage Take 1 tablet (500 mg total) by mouth daily with breakfast.   multivitamin with minerals tablet Take 1 tablet by mouth daily.   Myrbetriq 50 MG Tb24 tablet Generic drug: mirabegron ER Take 50 mg by mouth daily.   omeprazole 20 MG capsule Commonly known as: PRILOSEC Take 1-2 capsules (20-40 mg total) by mouth daily.   Pen Needles 32G X 4 MM Misc 1 each by Does not apply route daily.   polyethylene glycol 17 g packet Commonly known as: MIRALAX / GLYCOLAX Take 17 g by mouth daily as needed for moderate constipation.   pravastatin 20 MG tablet Commonly known as:  PRAVACHOL Take 1 tablet (20 mg total) by mouth daily.   vitamin C 500 MG tablet Commonly known as: ASCORBIC ACID Take 1,000 mg by mouth daily.       Discharge Instructions: Please refer to Patient Instructions section of EMR for full details.  Patient was counseled important signs and symptoms that should prompt return to medical care, changes in medications, dietary instructions, activity restrictions, and follow up appointments.   Follow-Up Appointments: Follow-up Information    Kathrene Alu, MD. Go on 06/14/2019.   Specialty: Family Medicine Why: Your appointment is scheduled for 10:30 AM.  Please arrive 15 minutes early. Contact information: [address] [place] [address] [phone]           Gifford Shave, MD 06/13/2019, 5:29 PM PGY-1, [place]

## 2019-06-11 NOTE — TOC Initial Note (Signed)
Transition of Care [facility]) - Initial/Assessment Note    Patient Details  Name: Claudia Walter MRN: [id number] Date of Birth: January 21, 1946  Transition of Care [facility]) CM/SW Contact:    Wende Neighbors, LCSW Phone Number: 06/11/2019, 2:09 PM  Clinical Narrative:     CSW spoke with patient about discharge plan. Patient stated she lives at home with her son and daughter in law. Patient stated she is agreeable to go to rehab but wanted son to be agreeable to it as well. CSW spoke with patient son Wynonia Lawman and he stated that he does not feel comfortable sending patient to a facility and not being able to visit her. Wynonia Lawman stated he would rather have patient come home with [facility]. Patient stated she is active with [facility] and would prefer to stay with them  MD stated that he would write an order for [facility] PT,OT,Aide and Social work. [facility] made aware              Expected Discharge Plan: [place] Barriers to Discharge: Continued Medical Work up   Patient Goals and CMS Choice Patient states their goals for this hospitalization and ongoing recovery are:: to be home with family CMS Medicare.gov Compare Post Acute Care list provided to:: Patient Choice offered to / list presented to : Patient  Expected Discharge Plan and Services Expected Discharge Plan: [place] In-house Referral: Clinical Social Work   Post Acute Care Choice: [place] arrangements for the past 2 months: [place]: PT, OT, Social Work, Refused SNF, Nurse's Aide HH Agency: [facility] Date HH Agency Contacted: 06/11/19 Time [place]: [place] Representative spoke with at [place] Arrangements/Services Living arrangements for the past 2 months: [place] with:: Self, Adult Children Patient language and need for interpreter reviewed:: Yes Do you feel safe going back to the place where you  live?: Yes      Need for Family Participation in Patient Care: Yes (Comment) Care giver support system in place?: Yes (comment)   Criminal Activity/Legal Involvement Pertinent to Current Situation/Hospitalization: Yes - Comment as needed  Activities of Daily Living Home Assistive Devices/Equipment: Wheelchair ADL Screening (condition at time of admission) Patient's cognitive ability adequate to safely complete daily activities?: Yes Is the patient deaf or have difficulty hearing?: No Does the patient have difficulty seeing, even when wearing glasses/contacts?: No Does the patient have difficulty concentrating, remembering, or making decisions?: No Patient able to express need for assistance with ADLs?: Yes Does the patient have difficulty dressing or bathing?: Yes Independently performs ADLs?: No Communication: Independent Dressing (OT): Needs assistance Is this a change from baseline?: Pre-admission baseline Grooming: Needs assistance Is this a change from baseline?: Pre-admission baseline Feeding: Independent Bathing: Needs assistance Is this a change from baseline?: Pre-admission baseline Toileting: Needs assistance Is this a change from baseline?: Pre-admission baseline In/Out Bed: Dependent Is this a change from baseline?: Pre-admission baseline Walks in Home: Dependent Is this a change from baseline?: Pre-admission baseline Does the patient have difficulty walking or climbing stairs?: Yes Weakness of Legs: Both Weakness of Arms/Hands: None  Permission Sought/Granted Permission sought to share information with : Family Supports Permission granted to share information with : Yes, Verbal Permission Granted  Share Information with NAME: Diasy Savitz  Permission granted to share info w Relationship: [phone]  Permission granted to share info w Contact Information: son  Emotional Assessment Appearance:: Appears stated age Attitude/Demeanor/Rapport: Engaged Affect  (typically observed): Accepting, Pleasant Orientation: : Oriented to Self, Oriented to Place, Oriented to  Time, Oriented to Situation Alcohol / Substance Use: Not Applicable Psych Involvement: No (comment)  Admission diagnosis:  AKI (acute kidney injury) ([place]) [N17.9] Diabetic ketoacidosis without coma associated with type 2 diabetes mellitus ([place]) [E11.10] Patient Active Problem List   Diagnosis Date Noted  . Pyelonephritis, acute   . Pressure injury of skin 06/09/2019  . Catheter-associated urinary tract infection ([place]) 06/09/2019  . AKI (acute kidney injury) ([place])   . E coli bacteremia   . Diabetic ketoacidosis without coma associated with type 2 diabetes mellitus ([place])   . Sepsis ([place]) 06/08/2019  . Abnormality of gait 11/16/2018  . Inadequate social support 09/06/2018  . Needs assistance with community resources 09/05/2018  . Gastroesophageal reflux disease   . Gastric ulcer without hemorrhage or perforation   . Hiatal hernia 12/15/2017  . Anemia 12/15/2017  . Multiple sclerosis ([place]) 12/14/2017  . Neurogenic bladder 12/14/2017  . Suprapubic catheter ([place]) 12/14/2017  . Osteoporosis 12/14/2017  . Type 2 diabetes mellitus ([place]) 12/14/2017  . Hyperlipidemia 12/14/2017   PCP:  Gladys Damme, MD Pharmacy:   [facility] [place] [place] [address] Phone: [phone] Fax: [phone]  [facility] [place], [place] [place] [address] [place] [address] Phone: [phone] Fax: [phone]     Social Determinants of Health (SDOH) Interventions    Readmission Risk Interventions No flowsheet data found.

## 2019-06-12 DIAGNOSIS — E118 Type 2 diabetes mellitus with unspecified complications: Secondary | ICD-10-CM

## 2019-06-12 DIAGNOSIS — Z794 Long term (current) use of insulin: Secondary | ICD-10-CM

## 2019-06-12 LAB — GLUCOSE, CAPILLARY
Glucose-Capillary: 223 mg/dL — ABNORMAL HIGH (ref 70–99)
Glucose-Capillary: 233 mg/dL — ABNORMAL HIGH (ref 70–99)
Glucose-Capillary: 235 mg/dL — ABNORMAL HIGH (ref 70–99)
Glucose-Capillary: 213 mg/dL — ABNORMAL HIGH (ref 70–99)

## 2019-06-12 MED ORDER — CEPHALEXIN 250 MG PO CAPS: 250.0000 mg | ORAL_CAPSULE | Freq: Four times a day (QID) | ORAL | 0 refills | Status: DC

## 2019-06-12 MED ORDER — INSULIN STARTER KIT- PEN NEEDLES (ENGLISH)
1.0000 | Freq: Once | Status: AC
  Administered 2019-06-12: 1
  Filled 2019-06-12: qty 1

## 2019-06-12 MED ORDER — LANTUS SOLOSTAR 100 UNIT/ML ~~LOC~~ SOPN: 15.0000 [IU] | PEN_INJECTOR | Freq: Every day | SUBCUTANEOUS | 11 refills | Status: DC

## 2019-06-12 MED ORDER — METFORMIN HCL 500 MG PO TABS: 500.0000 mg | ORAL_TABLET | Freq: Every day | ORAL | 0 refills | Status: DC

## 2019-06-12 MED ORDER — CEPHALEXIN 500 MG PO CAPS: 500.0000 mg | ORAL_CAPSULE | Freq: Four times a day (QID) | ORAL | 0 refills | Status: AC

## 2019-06-12 MED ORDER — INSULIN GLARGINE 100 UNIT/ML ~~LOC~~ SOLN: 15.0000 [IU] | Freq: Every day | SUBCUTANEOUS | 11 refills | Status: DC

## 2019-06-12 MED ORDER — PEN NEEDLES 32G X 4 MM MISC: 1.0000 | Freq: Every day | 3 refills | Status: DC

## 2019-06-12 MED ORDER — CEPHALEXIN 500 MG PO CAPS
500.0000 mg | ORAL_CAPSULE | Freq: Four times a day (QID) | ORAL | Status: DC
  Administered 2019-06-12 (×3): 500 mg via ORAL
  Filled 2019-06-12 (×3): qty 1

## 2019-06-12 MED ORDER — CEPHALEXIN 500 MG PO CAPS: 500.0000 mg | ORAL_CAPSULE | Freq: Four times a day (QID) | ORAL | 0 refills | Status: DC

## 2019-06-12 MED FILL — CEPHALEXIN 500 MG CAPSULE: 500 | 7 days supply | Qty: 27 | Fill #0

## 2019-06-12 MED FILL — LANTUS SOLOSTAR 100 UNITS/M: 100 | 30 days supply | Qty: 6 | Fill #0

## 2019-06-12 MED FILL — PENTIPS 32G X 4 MM MISC: 32G X 4 MM | 30 days supply | Qty: 100 | Fill #0

## 2019-06-12 NOTE — Discharge Instructions (Addendum)
Thank you for allowing [place] to participate in your care!    You were admitted for sepsis and diabetic ketoacidosis.  Your blood sugars were extremely elevated on admission.  Upon discharge you were going to be started on insulin.  Follow instructions given regarding insulin administration and glucose checking and follow-up with your primary care doctor Wednesday morning.  If you experience worsening of your admission symptoms, develop shortness of breath, life threatening emergency, suicidal or homicidal thoughts you must seek medical attention immediately by calling 911 or calling your MD immediately  if symptoms less severe.   Symptoms of Hypoglycemia: Strange, Sweaty, Shaky Check sugar if you have your meter.  If near or less than 70 mg/dl, treat with 1/2 cup juice or soda or take glucose tablets Check sugar 15 minutes after treatment.  If sugar still near or less than 70 mg/al and symptomatic, treat again and may need a snack with some protein (peanut butter with crackers, etc)  Insulin Pen Video Demo: [email or web address]  Insulin Pen Instructions:  1. Remove Insulin pen cap and clean pen 1st with alcohol rub and then clean skin 2nd with alcohol rub 2. Twist insulin pen needle onto pen (right tighty) 3. Remove outer cap and inner cap from needle 4. Dial pen to 2 units and perform prime- press pen to zero and make sure liquid (insulin) comes out of the needle 5. Dial pen to your dose and perform injection into your abdomen 6. Hold needle in skin for 10 seconds after injection 7. Remove needle from insulin pen and discard 8. Place cap back on insulin pen and store safely (at room temperature) 9. Store unused pens in refrigerator and can keep opened insulin pen at room temperature (discard used pen after 30 days)

## 2019-06-12 NOTE — Progress Notes (Signed)
Inpatient Diabetes Program   AACE/ADA: New Consensus Statement on Inpatient Glycemic Control (2015)  Target Ranges:  Prepandial:   less than 140 mg/dL      Peak postprandial:   less than 180 mg/dL (1-2 hours)      Critically ill patients:  140 - 180 mg/dL   Lab Results  Component Value Date   GLUCAP 235 (H) 06/12/2019   HGBA1C 12.3 (H) 06/09/2019    Educated patient and son on insulin pen use at home. Reviewed contents of insulin flexpen starter kit. Reviewed all steps if insulin pen including attachment of needle, 2-unit air shot, dialing up dose, giving injection, removing needle, disposal of sharps, storage of unused insulin, disposal of insulin etc. Patient able to provide successful return demonstration. Also reviewed troubleshooting with insulin pen. MD to give patient Rxs for insulin pens and insulin pen needles.  Reviewed hypoglycemia s/s and treatment with patient and son.  Patient mentioned "I came to the hospital in an ambulance and I will have to go back home in an ambulance." I asked patient if anyone else was aware. The son mentioned that the patient just told him. I discussed with RN.  Thanks,  Tama Headings RN, MSN, BC-ADM Inpatient Diabetes Coordinator Team Pager [phone] (8a-5p)

## 2019-06-12 NOTE — Progress Notes (Signed)
Family Medicine Teaching Service Daily Progress Note Intern Pager: [phone]  Patient name: Claudia Walter Medical record number: [id number] Date of birth: January 12, 1946 Age: 73 y.o. Gender: female  Primary Care Provider: Gladys Damme, MD Consultants: Urology Code Status: Full  Pt Overview and Major Events to Date:  Patient admitted for sepsis  Assessment and Plan: Claudia Walter is a 73 year old female presenting with sepsis, altered mental status and DKA.  Past medical history significant for neurogenic bladder, diabetes, multiple sclerosis, overactive bladder, IBS, and HDL.  Urosepsis Lactic acidosis, resolved White count improving.  Patient continues to have fevers.  Afebrile over the last 24 hours the T-max of 99.1.  Her vital signs are otherwise within normal limits.  She has been mildly tacky since admission but has recently come down to the 80s and 90s.  Her blood culture returns with E. coli with susceptibilities resulted urine culture showed multiple species and suggests 3 collection.  This is likely due to being collected from old catheter.  Patient's catheter has been changed to a new one W will continue to treat the E. coli bacteremia at this time.  WBCs 25 >20.6>15.0>8.5 - Tylenol 650 mg for fever -Patient on Keflex 500 mg 4 times daily for 7 days for a total of 10 days treatment  DKA, resolved  NIDDM, A1C 12.3.  Currently taking sensitive sliding scale 3 times daily with meals and 15 units of Lantus.  Patient's glucoses over the last 24 hours have ranged between 223 and 297.  She has received 11 units of NovoLog in the last 24 hours.  We will follow glucose today because she is already received her morning Lantus and adjust appropriately.  Continue to titrate with blood sugar goals 120-180 while admitted.   _Patient scheduled for 18 units of Lantus daily and takes her Lantus in the morning _ sensitive SSI TID WC _ carb modified diet  -Plan to discharge on 15 units of Lantus daily with  metformin initiating at 500 and tapering to 1500 daily  AKI Likely prerenal. Creatinine today is 1.18 from 1.25.  -Monitor   Elevated blood pressures while hospitalized Patient has had elevated blood pressures up to 167/87 - Follow-up with PCP for further investigation into possible hypertension  #Multiple sclerosis Takes baclofen 20mg  BID, gabapentin 600mg  BID -Restart baclofen 20 mg twice daily and gabapentin 600 mg twice daily -Monitor for pain   #Neurogenic bladder Takes Myrbetriq. -Continue Myrbetriq   #HLD Last lipid panel: 2019,TAGs 249, LDL 159, HDL 41, Chol 250, VLDL 50 -Continue pravastatin  #IBS Takes dicyclomine 10 mg Q8PRN  -Continue to hold  #GERD/hiatus hernia Takes omeprazole 20mg  once daily -Restarted PTX 40mg    #Nutrition  Takes vitamin C 100 mg, cyanocobalamin 75mg  once daily  -Can restart once off insulin drip  #Tobacco misuse disorder _ Continue welbutrin   #Inadequate social support Patient lives with son and wife, who are very caring and supportive.  Have the best interest for the patient.  However patient may benefit from additional help at home in addition to family support.  Family reported that they were feeding patient was lying supine, increased risk of aspiration and perhaps have poor understanding of her chronic conditions and how best to support her.  On multiple occasions during our phone conversation, the son mentioned that she had had similar presentations before and she only required intravenous vitamin C in order to get better and come home.  PT/OT recommended SNF placement.  After discussions with the family they wish to  take her home.  Patient scheduled for home health -- SW consulted 06/10/19 -Patient will be discharged home with sons  FEN/GI: Carb modified once off insulin drip PPx: Lovenox  Disposition: Likely SNF pending placement and continued medical improvement  Subjective:  Claudia Walter is doing well this morning.   Reports she slept well.  Denies any issues with leg spasms overnight.  Denies fever or chills.  Denies chest pain or shortness of breath.  Voices concern over ensuring that she knows how to properly check her sugars and give herself insulin.  Discussed follow-up with PCP  Objective: Temp:  [98.2 F (36.8 C)-99.1 F (37.3 C)] 98.2 F (36.8 C) (08/17 0407) Pulse Rate:  [92-101] 92 (08/17 0407) Resp:  [14-27] 15 (08/17 0407) BP: (137-169)/(72-95) 161/87 (08/17 0407) SpO2:  [96 %-98 %] 96 % (08/17 0407) Physical Exam: General: NAD, non-toxic, laying resting comfortably when I enter the room HEENT: [place]/AT. PERRLA. EOMI.  Cardiovascular: RRR, normal S1, S2. B/L 2+ RP.  1+ BLEE Respiratory: CTAB. No IWOB.  Abdomen: + BS. NT, ND, soft to palpation.  Patient with suprapubic cath in place.  Dressings appear clean. Extremities: Warm and well perfused. Moving spontaneously.  Soles of feet are scaly and shedding skin.  No signs of active infection in feet or around toes decreased lower extremity swelling Neuro: A & O x4. CN grossly intact. No FND   Laboratory: Recent Labs  Lab 06/09/19 1531 06/10/19 0231 06/11/19 1011  WBC 20.6* 15.0* 8.5  HGB 12.5 10.6* 10.8*  HCT 38.9 32.7* 33.0*  PLT 234 216 235   Recent Labs  Lab 06/08/19 2000  06/09/19 1531 06/10/19 0231 06/11/19 1011  NA 130*   < > 135 135 136  K 4.7   < > 3.7 3.5 3.1*  CL 93*   < > 105 105 102  CO2 19*   < > 17* 18* 22  BUN 32*   < > 28* 24* 18  CREATININE 1.83*   < > 1.25* 1.18* 0.91  CALCIUM 8.6*   < > 7.6* 7.4* 7.8*  PROT 6.5  --   --   --   --   BILITOT 0.9  --   --   --   --   ALKPHOS 135*  --   --   --   --   ALT 27  --   --   --   --   AST 37  --   --   --   --   GLUCOSE 702*   < > 173* 252* 257*   < > = values in this interval not displayed.    Imaging/Diagnostic Tests: Dg Chest 2 View Result Date: 06/09/2019 IMPRESSION: Minimal left pleural effusion with slight left base atelectasis. Lungs elsewhere clear.  Cardiac silhouette within normal limits. Aortic Atherosclerosis (ICD10-I70.0).   Ct Head Wo Contrast Result Date: 06/09/2019 IMPRESSION: Mild atrophy with slight periventricular small vessel disease. No acute infarct evident. No mass or hemorrhage. There are foci of arterial vascular calcification. There is mucosal thickening in several ethmoid air cells. There is probable cerumen in the left external auditory canal.   Dg Chest Portable 1 View Result Date: 06/08/2019  IMPRESSION: Possible tiny left effusion with mild left basilar atelectasis   Ct Renal Stone Study Result Date: 06/08/2019 IMPRESSION: 1. Nonobstructing right renal calculi. No hydronephrosis or obstructing stone. Correlation with urinalysis recommended to exclude UTI. 2. Fatty liver. 3. Moderate colonic stool burden. No bowel obstruction or active inflammation.  Normal appendix. 4. Aortic Atherosclerosis (ICD10-I70.0).     Gifford Shave, MD 06/12/2019, 5:59 AM PGY-2 [facility] Intern pager: [phone], text pages welcome

## 2019-06-12 NOTE — Care Management Important Message (Signed)
Important Message  Patient Details  Name: Claudia Walter MRN: [id number] Date of Birth: Jul 26, 1946   Medicare Important Message Given:  Yes     [name] [address] 06/12/2019, 3:02 PM

## 2019-06-12 NOTE — TOC Transition Note (Signed)
Transition of Care [facility]) - CM/SW Discharge Note   Patient Details  Name: Claudia Walter MRN: [id number] Date of Birth: [birth year]  Transition of Care [facility]) CM/SW Contact:  Sharin Mons, RN Phone Number: 06/12/2019, 5:00 PM   Clinical Narrative:    Admitted with sepsis/UTI. Hx of  neurogenic bladder, diabetes, MS, overactive bladder, IBS, HLD. Pt declined SNF placement. Pt will transition to home today with the resumption of home health services. Pt will need transportation services to home by PTAR. Nurse will call and arrange transportation to home . Pt states son and his family to assist and care for pt once d/c.  Caterine [place] (Son)      [phone]       Final next level of care: [place] Barriers to Discharge: No Barriers Identified   Patient Goals and CMS Choice Patient states their goals for this hospitalization and ongoing recovery are:: to be home with family CMS Medicare.gov Compare Post Acute Care list provided to:: Patient Choice offered to / list presented to : Patient  Discharge Placement                       Discharge Plan and Services In-house Referral: Clinical Social Work   Post Acute Care Choice: Home Health                    HH Arranged: RN, PT, OT, Social Work [place] Date [place]: 06/12/19 Time [place]: [id number] Representative spoke with at [place]: Cassie  Social Determinants of Health ([place]) Interventions     Readmission Risk Interventions No flowsheet data found.

## 2019-06-12 NOTE — Progress Notes (Addendum)
Inpatient Diabetes Program Recommendations  AACE/ADA: New Consensus Statement on Inpatient Glycemic Control (2015)  Target Ranges:  Prepandial:   less than 140 mg/dL      Peak postprandial:   less than 180 mg/dL (1-2 hours)      Critically ill patients:  140 - 180 mg/dL   Results for Claudia Walter, Claudia Walter (MRN [id number]) as of 06/12/2019 10:39  Ref. Range 06/11/2019 07:46 06/11/2019 11:51 06/11/2019 16:18 06/11/2019 21:04  Glucose-Capillary Latest Ref Range: 70 - 99 mg/dL 267 (H)  5 units NOVOLOG +  15 units LANTUS  223 (H)  3 units NOVOLOG  248 (H)  3 units NOVOLOG  297 (H)   Results for Claudia Walter, Claudia Walter (MRN [id number]) as of 06/12/2019 10:39  Ref. Range 06/12/2019 07:59  Glucose-Capillary Latest Ref Range: 70 - 99 mg/dL 223 (H)  3 units NOVOLOG +  18 units LANTUS   Results for Claudia Walter, Claudia Walter (MRN [id number]) as of 06/12/2019 10:39  Ref. Range 06/09/2019 00:24  Hemoglobin A1C Latest Ref Range: 4.8 - 5.6 % 12.3 (H)  (306 mg/dl)    Admit with: Sepsis, AMS, DKA  History: DM, MS  Home DM Meds: Metformin 500 mg Daily  Current Orders: Lantus 18 units Daily      Novolog Sensitive Correction Scale/ SSI (0-9 units) TID AC      Note Lantus increased to 18 units daily this AM.    Will need insulin at time of discharge.    MD- Please consider adding Novolog Meal Coverage to current inpatient insulin regimen:  Novolog 4 units TID with meals  (Please add the following Hold Parameters: Hold if pt eats <50% of meal, Hold if pt NPO)     Addendum 11am- Called pt by phone this AM to discuss going home on insulin.  Pt unsure whether she wants the insulin pens or vial and syringe and asked me if one of the diabetes nurses can come see her and her daughter in law (when her daughter in law gets here) to discuss both methods to help her choose the best for her.  Discussed with pt what Lantus and Novolog insulins are, how they work, when to take, etc.  Told pt I was unsure if the MD plans to  discharge her on Lantus only or both Lantus and Novolog.  Explained how a sliding scale works and reminded pt that being consistent with the timing of the Lantus is very important.  Also thoroughly explained how a Novolog SSI works and that pt needs to always check her CBG to determine the dose of Novolog and never guess at the dose.  Also reviewed in detail signs and symptoms of low blood sugars and how to treat.  Encouraged pt to get her family to buy her some juice boxes and/or glucose tablets at the pharmacy to keep at her bedside (pt stated he usually stays in bed until mid afternoon when her family can get her up--normally uses a wheelchair around the house).   Also discussed why pt needs insulin at home, current A1c results, basic DM info, etc.  Patient very appreciative of my call and requests the DM Coordinator please come back and speak with her and her daughter in law prior to d/c.  I asked RN caring for pt to please page [place] when pt's daughter in law arrives.  Have ordered the Insulin teaching kit to be sent to bedside for d/c as well.  Pt was unsure of her daughter in laws phone number.  I called  pt's son and left message asking for him to call me to see if I can get daughter in laws phone number to call her and give her a heads up on the info we will review when she gets to the hospital.  Waiting on return call from pt's son.     --Will follow patient during hospitalization--  Wyn Quaker RN, MSN, CDE Diabetes Coordinator Inpatient Glycemic Control Team Team Pager: [phone] (8a-5p)

## 2019-06-12 NOTE — Progress Notes (Signed)
Went to see patient as nurse called with reports of super pubic catheter leaking that first happened this morning and again later in the day. Checked catheter and there was no sign of leaking. Site was clean and intact. Catheter did not move with gentle tug. Obtained Bladder scam with no volume measured.   -replace cath prior to discharge   Dr. Maudie Mercury

## 2019-06-13 NOTE — Progress Notes (Signed)
Pt was discharged and transported home via [place] . Family was updated. Pt received education prior to discharge, had no questions. Pt was alert and oriented and her vital signs were stable upon discharge.

## 2019-06-14 ENCOUNTER — Other Ambulatory Visit: Payer: Self-pay

## 2019-06-14 ENCOUNTER — Telehealth: Payer: Self-pay | Admitting: Family Medicine

## 2019-06-14 ENCOUNTER — Encounter: Payer: Self-pay | Admitting: Family Medicine

## 2019-06-14 ENCOUNTER — Ambulatory Visit (INDEPENDENT_AMBULATORY_CARE_PROVIDER_SITE_OTHER): Payer: Medicare Other | Admitting: Family Medicine

## 2019-06-14 VITALS — BP 120/58 | HR 102 | Ht 62.0 in

## 2019-06-14 DIAGNOSIS — E1110 Type 2 diabetes mellitus with ketoacidosis without coma: Secondary | ICD-10-CM

## 2019-06-14 DIAGNOSIS — L89152 Pressure ulcer of sacral region, stage 2: Secondary | ICD-10-CM

## 2019-06-14 DIAGNOSIS — Z794 Long term (current) use of insulin: Secondary | ICD-10-CM

## 2019-06-14 DIAGNOSIS — E118 Type 2 diabetes mellitus with unspecified complications: Secondary | ICD-10-CM | POA: Diagnosis present

## 2019-06-14 MED ORDER — METFORMIN HCL 500 MG PO TABS: 1000.0000 mg | ORAL_TABLET | Freq: Every day | ORAL | 3 refills | Status: DC

## 2019-06-14 NOTE — Telephone Encounter (Signed)
Claudia Walter from [facility] is calling to have verbal orders to continue care with pt and to start wound care.   The best call back number is [phone]

## 2019-06-14 NOTE — Assessment & Plan Note (Signed)
Stage II ulcer.  Counseled patient and her son that she needs to have frequent positional changes and gave handout regarding management of pressure ulcers.  Also placed order for home health nursing to give wound care.  We will follow-up in 1 month along with diabetes evaluation.

## 2019-06-14 NOTE — Assessment & Plan Note (Addendum)
Hemoglobin A1c was 12.3 on admission, and patient was in DKA.  We will continue Lantus 15 units daily for now and advised patient to take her blood glucose measurement each morning before breakfast and to bring in her measurements at her next appointment, which should be in about 1 month.  We will also increase her metformin dose from 500 mg once daily 2000 mg once daily after another week of taking the 500 mg dose.  Will obtain BMP and CBC today.

## 2019-06-14 NOTE — Progress Notes (Signed)
Subjective:    Claudia Walter - 73 y.o. female MRN [id number]  Date of birth: 1946-08-05  CC:  Claudia Walter is here for hospital follow up, specifically for diabetes and blood pressure control following admission for DKA and E coli sepsis.  HPI: Type 2 DM CBGs: 167 fasting, checked her blood sugar yesterday but not today Meds: metformin 500 mg daily, Lantus 15 units daily, tolerating metformin well Hyperglycemic symptoms: None Hypoglycemic symptoms: None  Sacral ulcer Started in the hospital, thinks that a suction device for incontinence pulled skin off, has been bleeding but not has not had drainage of pus, not itching, seems to be worsening since discharge, located on her sacral area.  Health Maintenance:  Health Maintenance Due  Topic Date Due  . Hepatitis C Screening  21-Jun-1946  . FOOT EXAM  01/01/1955  . OPHTHALMOLOGY EXAM  01/01/1955  . URINE MICROALBUMIN  01/01/1955  . TETANUS/TDAP  01/01/1964  . MAMMOGRAM  01/01/1995  . INFLUENZA VACCINE  05/27/2019    -  reports that she quit smoking about 42 years ago. Her smoking use included cigarettes. She has never used smokeless tobacco. - Review of Systems: Per HPI. - Past Medical History: Patient Active Problem List   Diagnosis Date Noted  . Pyelonephritis, acute   . Pressure injury of skin 06/09/2019  . Catheter-associated urinary tract infection ([place]) 06/09/2019  . AKI (acute kidney injury) ([place])   . E coli bacteremia   . Diabetic ketoacidosis without coma associated with type 2 diabetes mellitus ([place])   . Sepsis ([place]) 06/08/2019  . Abnormality of gait 11/16/2018  . Inadequate social support 09/06/2018  . Needs assistance with community resources 09/05/2018  . Gastroesophageal reflux disease   . Gastric ulcer without hemorrhage or perforation   . Hiatal hernia 12/15/2017  . Anemia 12/15/2017  . Multiple sclerosis ([place]) 12/14/2017  . Neurogenic bladder 12/14/2017  . Suprapubic catheter ([place]) 12/14/2017  . Osteoporosis  12/14/2017  . Hyperlipidemia 12/14/2017   - Medications: reviewed and updated   Objective:   Physical Exam BP (!) 120/58   Pulse (!) 102   Ht 5\' 2"  (1.575 m)   SpO2 93%   BMI 28.67 kg/m  Gen: NAD, alert, cooperative with exam, appears chronically ill CV: RRR, good S1/S2, no murmur, no edema, capillary refill brisk  Resp: CTABL, no wheezes, non-labored Abd: SNTND, BS present, no guarding or organomegaly Skin: Stage II ulcer without active infection visualized on patient's sacrum, refer to occluded image Neuro: Wheelchair-bound due to paralysis of bilateral lower extremities          Assessment & Plan:   Diabetic ketoacidosis without coma associated with type 2 diabetes mellitus (HCC) Hemoglobin A1c was 12.3 on admission, and patient was in DKA.  We will continue Lantus 15 units daily for now and advised patient to take her blood glucose measurement each morning before breakfast and to bring in her measurements at her next appointment, which should be in about 1 month.  We will also increase her metformin dose from 500 mg once daily 2000 mg once daily after another week of taking the 500 mg dose.  Will obtain BMP and CBC today.  Pressure injury of skin Stage II ulcer.  Counseled patient and her son that she needs to have frequent positional changes and gave handout regarding management of pressure ulcers.  Also placed order for home health nursing to give wound care.  We will follow-up in 1 month along with diabetes evaluation.  Maia Breslow, M.D. 06/14/2019, 12:22 PM PGY-3, [facility]

## 2019-06-14 NOTE — Patient Instructions (Addendum)
It was nice meeting you today Claudia Walter!  You are developing a pressure ulcer.  This is stage II level, which means that you need wound care.  I am ordering home health wound care to start treating this wound.  In the meantime, please change positions frequently to relieve pressure from that area, which would likely need being on your left and right sides.  Please let us know if you do not hear from the wound care people.  Please start taking your blood sugars every morning before breakfast and take your insulin each morning.  Please continue the 15 units daily.  Please also continue metformin 500 mg for another week.  If you do not develop diarrhea, please start taking 2 pills rather than 1, which would make your dose of thousand milligrams once daily.  I would like for you to return in 1 month with your blood sugars to go over any changes that need to be made to your medications.  I am checking some labs today, and I will let you know if these are abnormal.  If you have any questions or concerns, please feel free to call the clinic.   Be well,  Dr. Shan Levans  Pressure Injury  A pressure injury is damage to the skin and underlying tissue that results from pressure being applied to an area of the body. It often affects people who must spend a long time in a bed or chair because of a medical condition. Pressure injuries usually occur:  Over bony parts of the body, such as the tailbone, shoulders, elbows, hips, heels, spine, ankles, and back of the head.  Under medical devices that make contact with the body, such as respiratory equipment, stockings, tubes, and splints. Pressure injuries start as reddened areas on the skin and can lead to pain and an open wound. What are the causes? This condition is caused by frequent or constant pressure to an area of the body. Decreased blood flow to the skin can eventually cause the skin tissue to die and break down, causing a wound. What increases the  risk? You are more likely to develop this condition if you:  Are in the hospital or an extended care facility.  Are bedridden or in a wheelchair.  Have an injury or disease that keeps you from: ? Moving normally. ? Feeling pain or pressure.  Have a condition that: ? Makes you sleepy or less alert. ? Causes poor blood flow.  Need to wear a medical device.  Have poor control of your bladder or bowel functions (incontinence).  Have poor nutrition (malnutrition). If you are at risk for pressure injuries, your health care provider may recommend certain types of mattresses, mattress covers, pillows, cushions, or boots to help prevent them. These may include products filled with air, foam, gel, or sand. What are the signs or symptoms? Symptoms of this condition depend on the severity of the injury. Symptoms may include:  Red or dark areas of the skin.  Pain, warmth, or a change of skin texture.  Blisters.  An open wound. How is this diagnosed? This condition is diagnosed with a medical history and physical exam. You may also have tests, such as:  Blood tests.  Imaging tests.  Blood flow tests. Your pressure injury will be staged based on its severity. Staging is based on:  The depth of the tissue injury, including whether there is exposure of muscle, bone, or tendon.  The cause of the pressure injury. How is this  treated? This condition may be treated by:  Relieving or redistributing pressure on your skin. This includes: ? Frequently changing your position. ? Avoiding positions that caused the wound or that can make the wound worse. ? Using specific bed mattresses, chair cushions, or protective boots. ? Moving medical devices from an area of pressure, or placing padding between the skin and the device. ? Using foams, creams, or powders to prevent rubbing (friction) on the skin.  Keeping your skin clean and dry. This may include using a skin cleanser or skin barrier as  told by your health care provider.  Cleaning your injury and removing any dead tissue from the wound (debridement).  Placing a bandage (dressing) over your injury.  Using medicines for pain or to prevent or treat infection. Surgery may be needed if other treatments are not working or if your injury is very deep. Follow these instructions at home: Wound care  Follow instructions from your health care provider about how to take care of your wound. Make sure you: ? Wash your hands with soap and water before and after you change your bandage (dressing). If soap and water are not available, use hand sanitizer. ? Change your dressing as told by your health care provider.  Check your wound every day for signs of infection. Have a caregiver do this for you if you are not able. Check for: ? Redness, swelling, or increased pain. ? More fluid or blood. ? Warmth. ? Pus or a bad smell. Skin care  Keep your skin clean and dry. Gently pat your skin dry.  Do not rub or massage your skin.  You or a caregiver should check your skin every day for any changes in color or any new blisters or sores (ulcers). Medicines  Take over-the-counter and prescription medicines only as told by your health care provider.  If you were prescribed an antibiotic medicine, take or apply it as told by your health care provider. Do not stop using the antibiotic even if your condition improves. Reducing and redistributing pressure  Do not lie or sit in one position for a long time. Move or change position every 1-2 hours, or as told by your health care provider.  Use pillows or cushions to reduce pressure. Ask your health care provider to recommend cushions or pads for you. General instructions   Eat a healthy diet that includes lots of protein.  Drink enough fluid to keep your urine pale yellow.  Be as active as you can every day. Ask your health care provider to suggest safe exercises or activities.  Do not  abuse drugs or alcohol.  Do not use any products that contain nicotine or tobacco, such as cigarettes, e-cigarettes, and chewing tobacco. If you need help quitting, ask your health care provider.  Keep all follow-up visits as told by your health care provider. This is important. Contact a health care provider if:  You have: ? A fever or chills. ? Pain that is not helped by medicine. ? Any changes in skin color. ? New blisters or sores. ? Pus or a bad smell coming from your wound. ? Redness, swelling, or pain around your wound. ? More fluid or blood coming from your wound.  Your wound does not improve after 1-2 weeks of treatment. Summary  A pressure injury is damage to the skin and underlying tissue that results from pressure being applied to an area of the body.  Do not lie or sit in one position for a  long time. Your health care provider may advise you to move or change position every 1-2 hours.  Follow instructions from your health care provider about how to take care of your wound.  Keep all follow-up visits as told by your health care provider. This is important. This information is not intended to replace advice given to you by your health care provider. Make sure you discuss any questions you have with your health care provider. Document Released: 10/12/2005 Document Revised: 05/11/2018 Document Reviewed: 05/11/2018 Elsevier Patient Education  [place].

## 2019-06-14 NOTE — Telephone Encounter (Signed)
Verbal orders given to [facility] ok per Dr. Shan Levans.  Jazmin Hartsell,CMA

## 2019-06-15 LAB — CBC
Hematocrit: 38.4 % (ref 34.0–46.6)
Hemoglobin: 12.2 g/dL (ref 11.1–15.9)
MCH: 26.7 pg (ref 26.6–33.0)
MCHC: 31.8 g/dL (ref 31.5–35.7)
MCV: 84 fL (ref 79–97)
Platelets: 456 10*3/uL — ABNORMAL HIGH (ref 150–450)
RBC: 4.57 x10E6/uL (ref 3.77–5.28)
RDW: 13.5 % (ref 11.7–15.4)
WBC: 8.7 10*3/uL (ref 3.4–10.8)

## 2019-06-15 LAB — BASIC METABOLIC PANEL
BUN/Creatinine Ratio: 20 (ref 12–28)
BUN: 17 mg/dL (ref 8–27)
CO2: 22 mmol/L (ref 20–29)
Calcium: 9 mg/dL (ref 8.7–10.3)
Chloride: 102 mmol/L (ref 96–106)
Creatinine, Ser: 0.87 mg/dL (ref 0.57–1.00)
GFR calc Af Amer: 76 mL/min/{1.73_m2} (ref >59)
GFR calc non Af Amer: 66 mL/min/{1.73_m2} (ref >59)
Glucose: 217 mg/dL — ABNORMAL HIGH (ref 65–99)
Potassium: 4.8 mmol/L (ref 3.5–5.2)
Sodium: 140 mmol/L (ref 134–144)

## 2019-06-16 ENCOUNTER — Telehealth: Payer: Self-pay | Admitting: *Deleted

## 2019-06-16 NOTE — Telephone Encounter (Signed)
-----   Message from Kathrene Alu, MD sent at 06/15/2019  5:15 PM EDT ----- Please let Ms. Snowdon know that her blood work looks normal.  Her glucose is 217, which is a little high but not concerning.  Q.

## 2019-06-16 NOTE — Telephone Encounter (Signed)
Pt informed of below.Claudia Walter, CMA ? ?

## 2019-06-19 ENCOUNTER — Telehealth: Payer: Self-pay | Admitting: *Deleted

## 2019-06-19 NOTE — Telephone Encounter (Signed)
Aaron Edelman from [facility] calling for OT verbal orders as follows:  2 time(s) weekly for 2 week(s)  You can leave verbal orders on confidential voicemail.  Christen Bame, CMA

## 2019-06-19 NOTE — Telephone Encounter (Signed)
Claudia Walter is requesting a verbal order to dress a broken blister with Vaseline and a foam dressing.  States that she can also dress with calcium alginate instead of Vaseline if MD prefers. Christen Bame, CMA

## 2019-06-19 NOTE — Telephone Encounter (Signed)
Spoke with Danae Chen, RN, home health nurse, who noted an open blister and wanted verbal orders for dressing. Approved vaseline and foam pad. Nurse also noted mild vaginal bleeding for the last week much like a period, but pt is post menopausal. Attempted to call pt at new cell number [phone], but she did not answer and voice mail is not yet set up.

## 2019-06-20 ENCOUNTER — Ambulatory Visit (INDEPENDENT_AMBULATORY_CARE_PROVIDER_SITE_OTHER): Payer: Medicare Other | Admitting: Family Medicine

## 2019-06-20 ENCOUNTER — Other Ambulatory Visit: Payer: Self-pay

## 2019-06-20 ENCOUNTER — Encounter: Payer: Self-pay | Admitting: Family Medicine

## 2019-06-20 VITALS — BP 118/58 | HR 96

## 2019-06-20 DIAGNOSIS — N950 Postmenopausal bleeding: Secondary | ICD-10-CM

## 2019-06-20 NOTE — Telephone Encounter (Signed)
Left VM on confidential system for [place], OT, confirming pt requires home PT.

## 2019-06-20 NOTE — Patient Instructions (Signed)
Thank you so much for coming in to see me!   It is unclear of the cause for your vaginal bleeding at this time. I have placed a order to have a pelvic ultrasound completed. I will contact you with the results and next steps.   Please feel free to contact me if you have any questions or concerns.  Take care, Dr. Tarry Kos

## 2019-06-20 NOTE — Progress Notes (Signed)
Subjective:   Patient ID: Claudia Walter    DOB: Aug 30, 1946, 72 y.o. female   MRN: [id number]  Claudia Walter is a 73 y.o. female with a history of multiple sclerosis here for abnormal uterine bleeding.  Vaginal Bleeding: Vaginal spotting of bright red blood x 2-3 days. Patient wears a diaper daily and has a suprapubic catheter. Denies any blood in her urine. Denies any blood in her stool. Notes she does not have regular  bowel movements, last bowel movement 1 week ago, normal appearing. Denies any pelvic pain or cramps. Notices the blood on the wipes every time she wipes herself. She has never had enough to collect in her diaper. Denies any trauma. She was hospitalized for urosepsis and DKA on 8/13-8/18. Not sure if anything occurred while she was unconscious. Patient notes she had menopause at age 51 and has not had any periods since then. She does have her uterus and ovaries. She notes she has not been to an OBGYN in over 20 years but recalls being told she had "fibroids" but that no treatment was necessary given she was asymptomatic. Denies any fevers, chills, nausea, vomiting. Denies any hormone replacement. Other than medications prescribed she takes OTC MTVI, Lutein, Vit C.  Patient denies any personal history of breast, colon, or endometrial cancer. Does endorse father dying of colon cancer. Patient is not sexually active. Patient had upper endoscopy in 2019 that was unremarkable. She had a colonoscopy in 2012 noted for elongated colon consistent with neurogenic colon with normal mucosa. Denies weight loss or night sweats  Review of Systems:  Per HPI.   [place], medications and smoking status reviewed.  Objective:   BP (!) 118/58   Pulse 96   SpO2 92%  Vitals and nursing note reviewed.  General: well nourished, well developed, in no acute distress with non-toxic appearance, sitting comfortably in wheel chair  HEENT: normocephalic, atraumatic, moist mucous membranes CV: regular rate and rhythm  without murmurs, rubs, or gallops Lungs: clear to auscultation bilaterally with normal work of breathing Abdomen: soft, non-tender, non-distended Skin: warm, dry Extremities: warm and well perfused MSK: Lower extremities very stiff and difficult to move without pain Neuro: Alert and oriented, speech normal  Pelvic exam: normal external genitalia, VULVA: normal appearing vulva with no masses, tenderness or lesions, VAGINA: normal appearing vagina with normal color and discharge, no lesions. Small pool of blood at back of vagina, but unable to visualize cervix due to inability to position patient appropriately.  Exam limited by patient's multiple sclerosis. Pelvis nontender.  Chaperone: Eusebio Friendly  Assessment & Plan:   Postmenopausal vaginal bleeding Acute onset vaginal spotting in postmenopausal woman without pelvic pain. No external causes identified on pelvic exam however exam limited due to patient's comorbid conditions. No red flag symptoms or signs on exam to indicate infection. Differential is broad and includes malignancy given age, polyps, fibroids, endometrial hyperplasia, or adenomyosis. Medications reviewed - none associated with increased risk of vaginal bleeding. Discussed work up with patient and family members. - obtain pelvic and transvaginal ultrasound   - scheduled for 06/23/19 - pending ultrasound results will schedule for endometrial biopsy vs refer to OBGYN for further evaluation - return precautions discussed - given acute onset, CBC not obtained today. If continues, will obtain CBC to evaluate for anemia - RTC pending u/s results  Orders Placed This Encounter  Procedures  . US Pelvic Complete With Transvaginal    Epic order Wt 156 / MS - cannot move lower extremities, son  will come in to assist / pt has catheter, may not be able to hold bladder / COVID-19 Q'S = NO Ins - mcr/bcbs Bl/shari @ [facility]    Standing Status:   Future    Standing Expiration Date:    08/19/2020    Order Specific Question:   Reason for Exam (SYMPTOM  OR DIAGNOSIS REQUIRED)    Answer:   postmenopausal bleeding    Order Specific Question:   Preferred imaging location?    Answer:   [facility]    Mina Marble, DO PGY-2, [place] [facility] 06/20/2019 8:04 PM

## 2019-06-20 NOTE — Assessment & Plan Note (Signed)
Acute onset vaginal spotting in postmenopausal woman without pelvic pain. No external causes identified on pelvic exam however exam limited due to patient's comorbid conditions. No red flag symptoms or signs on exam to indicate infection. Differential is broad and includes malignancy given age, polyps, fibroids, endometrial hyperplasia, or adenomyosis. Medications reviewed - none associated with increased risk of vaginal bleeding. Discussed work up with patient and family members. - obtain pelvic and transvaginal ultrasound   - scheduled for 06/23/19 - pending ultrasound results will schedule for endometrial biopsy vs refer to OBGYN for further evaluation - return precautions discussed - given acute onset, CBC not obtained today. If continues, will obtain CBC to evaluate for anemia - RTC pending u/s results

## 2019-06-23 ENCOUNTER — Ambulatory Visit
Admission: RE | Admit: 2019-06-23 | Discharge: 2019-06-23 | Disposition: A | Discharge status: Home / Self Care | Payer: Medicare Other | Source: Ambulatory Visit | Attending: Family Medicine | Admitting: Family Medicine

## 2019-06-23 DIAGNOSIS — N950 Postmenopausal bleeding: Secondary | ICD-10-CM

## 2019-06-27 ENCOUNTER — Telehealth: Payer: Self-pay

## 2019-06-27 DIAGNOSIS — L89329 Pressure ulcer of left buttock, unspecified stage: Secondary | ICD-10-CM

## 2019-06-27 NOTE — Progress Notes (Signed)
Attempted to call patient and patient's son several times over the weekend to discuss results without success. Unable to leave voicemail. Special thank you to Dr. Owens Shark for reviewing results with patient and scheduling follow up.

## 2019-06-27 NOTE — Telephone Encounter (Signed)
Patients daughter in law calls nurse line stating they have not heard anything about recent US. They are very worried. Will forward to preceptor and PCP. As they would like a call back today. I informed them their PCP has not been in the office this week.

## 2019-06-27 NOTE — Telephone Encounter (Signed)
Clarified with Dr. Owens Shark that this message was sent to me in error.

## 2019-06-27 NOTE — Telephone Encounter (Signed)
Dorian Pod, home health nurse with [facility], calls nurse line stating she believes a wound referral would be a good idea. Patient has 2 wounds on her bottom, the biggest one being 5X4. Patient has a new wound on his LLE that developed while she went for an [place] last week. No signs of infection and Dorian Pod has been putting xeroform wound dressing on all three wounds, however she feels wound nurses will be able to do what's best for patient.   Please place wound referral for in home with [facility].

## 2019-06-27 NOTE — Telephone Encounter (Signed)
Call patient regarding her ultrasound.  This demonstrates thickened endometrial lining at 15 mm.  Discussed at length with patient.  I recommended an endometrial biopsy.  This was scheduled for next Thursday, September 10 at 10:30 AM.  I discussed the overall procedure with the patient.  The appointment is scheduled for next week.

## 2019-06-27 NOTE — Telephone Encounter (Signed)
Forwarding to ordering provider as well.  Drs. Mullis and Mahoney--please call patient today.  Dorris Singh, MD  Family Medicine Teaching Service

## 2019-07-05 ENCOUNTER — Other Ambulatory Visit: Payer: Self-pay | Admitting: *Deleted

## 2019-07-05 MED ORDER — BUPROPION HCL 75 MG PO TABS: 75.0000 mg | ORAL_TABLET | Freq: Every day | ORAL | 1 refills | Status: DC

## 2019-07-06 ENCOUNTER — Other Ambulatory Visit: Payer: Self-pay

## 2019-07-06 ENCOUNTER — Encounter: Payer: Self-pay | Admitting: Family Medicine

## 2019-07-06 ENCOUNTER — Ambulatory Visit (INDEPENDENT_AMBULATORY_CARE_PROVIDER_SITE_OTHER): Payer: Medicare Other | Admitting: Family Medicine

## 2019-07-06 VITALS — BP 120/61 | HR 92 | Temp 98.7°F

## 2019-07-06 DIAGNOSIS — N8500 Endometrial hyperplasia, unspecified: Secondary | ICD-10-CM | POA: Diagnosis not present

## 2019-07-06 DIAGNOSIS — N950 Postmenopausal bleeding: Secondary | ICD-10-CM

## 2019-07-06 NOTE — Progress Notes (Signed)
Endometrial Biopsy (EMB) Procedure Note PRE-OP DIAGNOSIS: Postmenopausal AUB POST-OP DIAGNOSIS: Same  PROCEDURE: endometrial biopsy Performing Physician: Zettie Cooley, MD Supervising Physician (if applicable): Andrena Mews, MD    PROCEDURE:  A timeout protocol was performed prior to initiating the procedure Bimanual examination was done to determine the position of the uterus.  Vaginal speculum was inserted, and the cervix was visualized.  The cervix was cleansed with antiseptic solution.  A tenaculum was placed on the cervix for traction. Uterine sound was placed in cervical canal to measure uterine depth, but was unable to penetrate cervical canal. The sampling pipette more easily access the canal and was insterted approximately 5 centimeters into the uterus. The instrument was withdrawn and rotated 3-5 times to obtain endometrial smaple which was labeled and sent to pathology.    Followup: The patient tolerated the procedure well without complications.  Standard post-procedure care is explained and return precautions are given.

## 2019-07-06 NOTE — Addendum Note (Signed)
Addended by: Andrena Mews T on: 07/06/2019 12:59 PM   Modules accepted: Level of Service

## 2019-07-06 NOTE — Progress Notes (Signed)
   Gyn Clinic  CC: Post menopausal AV [id number] Claudia Walter is a 73 y.o. female who presents today for endometrial biopsy.  Patient presented to clinic and saw Dr. Tarry Kos on 06/20/2019 and was sent to ultrasound which was completed on 06/23/2019.  The ultrasound noted to have a thickened, heterogeneous endometrium measuring 15 mm which is abnormal for her age.  Recommendations for endometrial sampling to exclude carcinoma. Please see Dr. Oralia Rud note for further information.   Pmhx: MS, OA, Subrapubic catheter, sacral ulcer, T2DM Family history: No documented history of ovarian, breast, endometrial cancer.  Assessment & Plan  Was able to obtain adequate biopsy and will send this to lab for pathology. Patient tolerated procedure well.   If these results are benign, will send patient to Gynecologist for sonohysterogram.  Case discussed with Dr. Harlin Heys, M.D.  PGY-2  Family Medicine  [phone] 07/06/2019 10:22 AM

## 2019-07-06 NOTE — Patient Instructions (Addendum)
You did very well in tolerating the procedure today. We will send the sample to the pathologist and call you with any results as well as discuss further evaluation and management.   Drs. [facility]   Endometrial Biopsy, Care After This sheet gives you information about how to care for yourself after your procedure. Your health care provider may also give you more specific instructions. If you have problems or questions, contact your health care provider. What can I expect after the procedure? After the procedure, it is common to have:  Mild cramping.  A small amount of vaginal bleeding for a few days. This is normal. Follow these instructions at home:   Take over-the-counter and prescription medicines only as told by your health care provider.  Do not douche, use tampons, or have sexual intercourse until your health care provider approves.  Return to your normal activities as told by your health care provider. Ask your health care provider what activities are safe for you.  Follow instructions from your health care provider about any activity restrictions, such as restrictions on strenuous exercise or heavy lifting. Contact a health care provider if:  You have heavy bleeding, or bleed for longer than 2 days after the procedure.  You have bad smelling discharge from your vagina.  You have a fever or chills.  You have a burning sensation when urinating or you have difficulty urinating.  You have severe pain in your lower abdomen. Get help right away if:  You have severe cramps in your stomach or back.  You pass large blood clots.  Your bleeding increases.  You become weak or light-headed, or you pass out. Summary  After the procedure, it is common to have mild cramping and a small amount of vaginal bleeding for a few days.  Do not douche, use tampons, or have sexual intercourse until your health care provider approves.  Return to your normal  activities as told by your health care provider. Ask your health care provider what activities are safe for you. This information is not intended to replace advice given to you by your health care provider. Make sure you discuss any questions you have with your health care provider. Document Released: 08/02/2013 Document Revised: 09/24/2017 Document Reviewed: 10/28/2016 Elsevier Patient Education  2020 [employer].

## 2019-07-11 ENCOUNTER — Telehealth: Payer: Self-pay | Admitting: Family Medicine

## 2019-07-11 DIAGNOSIS — N950 Postmenopausal bleeding: Secondary | ICD-10-CM

## 2019-07-11 NOTE — Telephone Encounter (Signed)
Patient is still having some light vaginal bleeding. Since the Endometrial biopsy results are negative, her next option is to be referred to a gynecologist for possible hysteroscopy. Patient requests that referral at this time. Patient requests to be seen by a female gynecologist. She feels reassured by the biopsy and has no further questions at this time.  Gladys Damme, MD Family Medicine, PGY-1

## 2019-07-11 NOTE — Telephone Encounter (Addendum)
I discussed her EB report which is neg for cancer.  I advised that if she continues to bleed, she might benefit from hysteroscopy and biopsy.  Conversation she will have with her PCP to discuss referral if needed.  She mentioned that she still has similar pinkish discharge since she left the hospital during her last admission. I think at some point, she will benefit from [facility] referral for hysteroscopy.  Message routed to her PCP and Dr. Tarry Kos for reassessment and referral.  NB: Consider obtaining wet prep to r/o candida vaginitis or BV.

## 2019-07-13 ENCOUNTER — Encounter: Payer: Self-pay | Admitting: Family Medicine

## 2019-07-13 NOTE — Telephone Encounter (Signed)
Thankful results were negative. Appears PCP has addressed next steps. Will continue to follow and available for any assistance if needed. Patient has opted for referral to Gyn.

## 2019-07-18 ENCOUNTER — Ambulatory Visit: Payer: Medicare Other | Admitting: Neurology

## 2019-07-18 ENCOUNTER — Telehealth: Payer: Self-pay | Admitting: Obstetrics & Gynecology

## 2019-07-18 NOTE — Telephone Encounter (Signed)
Attempted to call patient about her appointment on 9/23 @ 9:55. No answer left voicemail instructing patient to wear a face mask for the entire appointment and no visitors are allowed during the visit. Patient instructed not to attend the appointment if she was any symptoms. Symptom list and office number left.

## 2019-07-19 ENCOUNTER — Encounter: Payer: Medicare Other | Admitting: Obstetrics & Gynecology

## 2019-07-20 ENCOUNTER — Other Ambulatory Visit: Payer: Self-pay | Admitting: Family Medicine

## 2019-07-20 DIAGNOSIS — E118 Type 2 diabetes mellitus with unspecified complications: Secondary | ICD-10-CM

## 2019-07-20 DIAGNOSIS — Z794 Long term (current) use of insulin: Secondary | ICD-10-CM

## 2019-07-20 MED ORDER — LANTUS SOLOSTAR 100 UNIT/ML ~~LOC~~ SOPN: 15.0000 [IU] | PEN_INJECTOR | Freq: Every day | SUBCUTANEOUS | 0 refills | Status: DC

## 2019-07-20 NOTE — Progress Notes (Signed)
Called by inpatient pharmacist that patient had called as she was not able to get refill of Lantus and she does not have enough for tonight.  Asking for refill of Lantus for tonight to avoid hyperglycemia.  Refill sent x1.  Will forward message to PCP as well.

## 2019-07-27 ENCOUNTER — Telehealth: Payer: Self-pay

## 2019-07-27 NOTE — Telephone Encounter (Signed)
Claudia Walter, home health wound nurse, called nurse line to update PCP on wounds. Wounds are looking great, however a new one has formed on her buttocks ~2cm. Claudia Walter will treat along with other wounds.

## 2019-07-27 NOTE — Telephone Encounter (Signed)
Duly noted, thanks!

## 2019-07-31 ENCOUNTER — Telehealth: Payer: Self-pay | Admitting: Gastroenterology

## 2019-07-31 NOTE — Telephone Encounter (Signed)
Called patient back and she states this was Sat night that she had the emesis x2 (dark Brown to black). She had pain in her chest and abdomin and a burning sensation. It last about 8 hrs. She drank lots of cold water, which seemed to help. She has not had any symptoms since. No tdizzy, SOB or any fever. She is taking Omeprazole once a day, but she was not taking in 30 mins. Before a meal. But she will start doing that. Please advise

## 2019-08-01 ENCOUNTER — Other Ambulatory Visit: Payer: Self-pay

## 2019-08-01 MED ORDER — IBUPROFEN 600 MG PO TABS: 600.0000 mg | ORAL_TABLET | Freq: Every day | ORAL | 2 refills | Status: DC | PRN

## 2019-08-11 ENCOUNTER — Telehealth: Payer: Self-pay | Admitting: *Deleted

## 2019-08-11 MED ORDER — FREESTYLE LITE TEST VI STRP: ORAL_STRIP | 12 refills | Status: DC

## 2019-08-11 NOTE — Telephone Encounter (Signed)
Pt needs refill on freestyle lite strips.  She is completely out.  Since she is medicare will send in under Dr. Erin Hearing [facility]) name.  Claudia Walter, CMA

## 2019-08-30 ENCOUNTER — Encounter: Payer: Self-pay | Admitting: Neurology

## 2019-08-30 ENCOUNTER — Other Ambulatory Visit: Payer: Self-pay

## 2019-08-30 ENCOUNTER — Ambulatory Visit (INDEPENDENT_AMBULATORY_CARE_PROVIDER_SITE_OTHER): Payer: Medicare Other | Admitting: Neurology

## 2019-08-30 VITALS — BP 118/69 | HR 83 | Temp 98.7°F | Ht 62.0 in

## 2019-08-30 DIAGNOSIS — G35 Multiple sclerosis: Secondary | ICD-10-CM | POA: Diagnosis not present

## 2019-08-30 MED ORDER — GABAPENTIN 300 MG PO CAPS: 600.0000 mg | ORAL_CAPSULE | Freq: Two times a day (BID) | ORAL | 1 refills | Status: DC

## 2019-08-30 NOTE — Progress Notes (Signed)
PATIENT: Claudia Walter DOB: [birth year]  REASON FOR VISIT: follow up HISTORY FROM: patient  HISTORY OF PRESENT ILLNESS: Today 08/30/19  HISTORY HISTORY OF PRESENT ILLNESS: Claudia Walter a 73 year old female, seen in refer by primary care physician [name], for evaluation of relapsing remitting multiple sclerosis, initial evaluation was on January 18, 2018,  She was brought in by transportation, in electronic wheelchair, alone at today's clinical visit,  I was able to review old her previous neurological evaluation from [facility] by Dr. [name], most recent note was onJuly 20, 2018,  She is a retired Public relations account executive, was diagnosed with multiple sclerosis since 1993, she presented with thoracic myelopathy, initial symptoms involved pinching around her waist felt like tightening sensation, paresthesia involving all limbs, lumbar puncture was positive, Evoked potential was positive,  She had slowly progressive worsening gait abnormality, remembered in 2000, after her mother passed away, she began to ambulate with a cane, in 2005, after her husband passed away, she began to use her walker, has to quit her job due to worsening gait difficulty, was given a course of IV steroid, did improve her strength temporarily, Betaseron since 2004, she moved to [place] in 2007 due to family stress, was followed by local neurologist [name] at, , [facility], noticed continue progressive gait abnormality, eventually begin to use wheelchair in 2009, but still able to take a few steps with his walker, switched to Copaxone in 2013,   In 2014 she injured her right hip, later also suffered right tibial and fibular fracture, wastreated with a bracefor 8 weeks, was never able to walk again, she also stopped long-term immunomodification therapy since 2014, had a short trial of Ampyra without helping her gait.  Per record, last MRI was in 2018, showed  stable hemisphere lesions,  Laboratory evaluation per record showed: vitamin D was 32, she is JC virus positive with index of 0.41, Tegretol level was 9.4,  She is now living with her son, and his girlfriend and children, she is tearful during today's interview,she has been inher currentwheelchair for 10 years, it has become malfunction, she has more difficulty positioning herself in the wheelchair, also complains of worsening bilateral hip pain due to difficulty positioning, developed decubitus ulcer.  She had suprapubic catheter for 10 years, was recently evaluated by urologist [name] at [facility]rologist, ultrasound of kidney was reported normal  MRI of the brain without contrast in November 2018 from [place] reported mild cerebral atrophy, with proportional ventricular dilatation, scattered supratentorium periventricular, and subcortical white matter hyper intensity, was reported as stable,  Laboratory evaluation in June 2018, normal CBC hemoglobin of 12.8, CMP creatinine of 0.76, normal TSH 3.78, vitamin D of 41, mild elevated alkaline phosphate 158, B12 is 564,  UPDATE April 27 2018:YY She continue with electronic wheelchair today, was dropped in by transportation, she reported intermittent episode of sharp pain starting at left knee, radiating to left shin, lasting for hours, ibuprofen only provide limited help  She also reported that this morning, she was very tired, difficulty to be awakened, similar to the symptoms she had previously for her UTI, UPDATE 1/22/2020CM Ms. 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  She was diagnosed in 1993.  She has a new Clinical research associate.  At her last visit she was complaining of intermittent sharp pain at the left knee but that is much better  and she is no longer taking tramadol.  She continues to have home health for CNA  services for bathing and her suprapubic catheter is changed by nursing.  She did have a decubitus ulcer but that is mostly healed.  She was getting physical therapy but has plateaued.  She remains on baclofen Wellbutrin and Neurontin from this office.  She requires help to transfer from bed to to wheelchair.  She lives with her son and his girlfriend.  Last MRI of the brain in 2018 was stable.  She returns for reevaluation  Update August 30, 2019 SS: She is here today alone.  Since last seen, she was hospitalized in August for urosepsis and elevated glucose.  She is now taking insulin.  She remains on Wellbutrin, gabapentin, and baclofen.  She admits, she often only takes the medications once a day.  She is an Clinical research associate.  She lives with her son and his girlfriend.  Last MRI of the brain was in 2018 and was stable.  She has been off immunomodulating medication since 2014.  She has a suprapubic catheter.  She says sometimes at night she may have stabbing pain to her right ankle when lying on her right side, is relieved with position change.  She has a CNA who comes once a week to help with bathing, a nurse comes once a week for catheter care.  She did have sacral ulcers, but have now healed.  She requires assistance with bathing and ADLs.  She is able to administer her medications.  She wishes to have further home health assistance, thinks she could benefit from physical therapy or rehab.  REVIEW OF SYSTEMS: Out of a complete 14 system review of symptoms, the patient complains only of the following symptoms, and all other reviewed systems are negative.  Weakness  ALLERGIES: Allergies  Allergen Reactions  . Sulfa Antibiotics Nausea And Vomiting    HOME MEDICATIONS: Outpatient Medications Prior to Visit  Medication Sig Dispense Refill  . baclofen (LIORESAL) 20 MG tablet TAKE 1 TABLET(20 MG) BY MOUTH TWICE DAILY (Patient taking differently: Take 30 mg by mouth daily. Pt takes 1.5 tablets.) 60  tablet 11  . buPROPion (WELLBUTRIN) 75 MG tablet Take 1 tablet (75 mg total) by mouth daily. 90 tablet 1  . gabapentin (NEURONTIN) 300 MG capsule Take 2 capsules (600 mg total) by mouth 2 (two) times daily. (Patient taking differently: Take 600 mg by mouth daily. ) 360 capsule 1  . glucose blood (FREESTYLE LITE) test strip Use as instructed to test sugar once daily.  Dx code:11.10 100 each 12  . ibuprofen (ADVIL) 600 MG tablet Take 1 tablet (600 mg total) by mouth daily as needed. 30 tablet 2  . Insulin Glargine (LANTUS SOLOSTAR) 100 UNIT/ML Solostar Pen Inject 15 Units into the skin daily. 15 mL 0  . Insulin Pen Needle (PEN NEEDLES) 32G X 4 MM MISC 1 each by Does not apply route daily. 100 each 3  . LUTEIN PO Take 1 tablet by mouth daily.    . metFORMIN (GLUCOPHAGE) 500 MG tablet Take 2 tablets (1,000 mg total) by mouth daily with breakfast. 60 tablet 3  . mirabegron ER (MYRBETRIQ) 50 MG TB24 tablet Take 50 mg by mouth daily.    . Multiple Vitamins-Minerals (MULTIVITAMIN WITH MINERALS) tablet Take 1 tablet by mouth daily.    . polyethylene glycol (MIRALAX / GLYCOLAX) packet Take 17 g by mouth daily as needed for moderate constipation.    . pravastatin (PRAVACHOL) 20  MG tablet Take 1 tablet (20 mg total) by mouth daily. 90 tablet 3  . vitamin C (ASCORBIC ACID) 500 MG tablet Take 1,000 mg by mouth daily.      No facility-administered medications prior to visit.     PAST MEDICAL HISTORY: Past Medical History:  Diagnosis Date  . Depression   . Diabetes mellitus without complication ([place])   . GERD (gastroesophageal reflux disease)   . Hyperlipemia   . Multiple sclerosis ([place])   . Osteoporosis     PAST SURGICAL HISTORY: Past Surgical History:  Procedure Laterality Date  . CESAREAN SECTION N/A    X 2  . ESOPHAGOGASTRODUODENOSCOPY (EGD) WITH PROPOFOL N/A 04/12/2018   Procedure: ESOPHAGOGASTRODUODENOSCOPY (EGD) WITH PROPOFOL;  Surgeon: Yetta Flock, MD;  Location: [facility];   Service: Gastroenterology;  Laterality: N/A;  . HIP FRACTURE SURGERY Right     FAMILY HISTORY: Family History  Problem Relation Age of Onset  . Diabetes Mother   . Heart attack Mother   . Colon cancer Father     SOCIAL HISTORY: Social History   Socioeconomic History  . Marital status: Widowed    Spouse name: Not on file  . Number of children: 2  . Years of education: college  . Highest education level: Bachelor's degree (e.g., BA, AB, BS)  Occupational History  . Occupation: Disabled  Social Needs  . Financial resource strain: Not on file  . Food insecurity    Worry: Not on file    Inability: Not on file  . Transportation needs    Medical: Not on file    Non-medical: Not on file  Tobacco Use  . Smoking status: Former Smoker    Types: Cigarettes    Quit date: 1978    Years since quitting: 42.8  . Smokeless tobacco: Never Used  Substance and Sexual Activity  . Alcohol use: Yes    Comment: occasional glass of wine  . Drug use: Never  . Sexual activity: Not on file  Lifestyle  . Physical activity    Days per week: Not on file    Minutes per session: Not on file  . Stress: Not on file  Relationships  . Social Herbalist on phone: Not on file    Gets together: Not on file    Attends religious service: Not on file    Active member of club or organization: Not on file    Attends meetings of clubs or organizations: Not on file    Relationship status: Not on file  . Intimate partner violence    Fear of current or ex partner: Not on file    Emotionally abused: Not on file    Physically abused: Not on file    Forced sexual activity: Not on file  Other Topics Concern  . Not on file  Social History Narrative   Lives at home with her son, his girlfriend and her children.   Left-handed.   1-2 cups caffeine daily.    PHYSICAL EXAM  Vitals:   08/30/19 1351  BP: 118/69  Pulse: 83  Temp: 98.7 F (37.1 C)  Height: 5\' 2"  (1.575 m)   Body mass index is  28.67 kg/m.  Generalized: Well developed, in no acute distress, in a wheelchair  Neurological examination  Mentation: Alert oriented to time, place, history taking. Follows all commands speech and language fluent Cranial nerve II-XII: Pupils were equal round reactive to light. Extraocular movements were full, visual field were full on  confrontational test. Facial sensation and strength were normal. Head turning and shoulder shrug  were normal and symmetric. Motor: The motor testing reveals 5 over 5 strength of bilateral upper extremities, proximal and distal weakness of lower extremities, left greater than right.   Sensory: Sensory testing is intact to soft touch on all 4 extremities. No evidence of extinction is noted.  Coordination: Cerebellar testing reveals good finger-nose-finger bilaterally Gait and station: She is nonambulatory, in a wheelchair.  Reflexes: Deep tendon reflexes are symmetric but depressed bilaterally   DIAGNOSTIC DATA (LABS, IMAGING, TESTING) - I reviewed patient records, labs, notes, testing and imaging myself where available.  Lab Results  Component Value Date   WBC 8.7 06/14/2019   HGB 12.2 06/14/2019   HCT 38.4 06/14/2019   MCV 84 06/14/2019   PLT 456 (H) 06/14/2019      Component Value Date/Time   NA 140 06/14/2019 1226   K 4.8 06/14/2019 1226   CL 102 06/14/2019 1226   CO2 22 06/14/2019 1226   GLUCOSE 217 (H) 06/14/2019 1226   GLUCOSE 257 (H) 06/11/2019 1011   BUN 17 06/14/2019 1226   CREATININE 0.87 06/14/2019 1226   CALCIUM 9.0 06/14/2019 1226   PROT 6.5 06/08/2019 2000   ALBUMIN 2.8 (L) 06/08/2019 2000   AST 37 06/08/2019 2000   ALT 27 06/08/2019 2000   ALKPHOS 135 (H) 06/08/2019 2000   BILITOT 0.9 06/08/2019 2000   GFRNONAA 66 06/14/2019 1226   GFRAA 76 06/14/2019 1226   Lab Results  Component Value Date   CHOL 250 (H) 12/14/2017   HDL 41 12/14/2017   LDLCALC 159 (H) 12/14/2017   TRIG 249 (H) 12/14/2017   CHOLHDL 6.1 (H) 12/14/2017    Lab Results  Component Value Date   HGBA1C 12.3 (H) 06/09/2019   No results found for: VITAMINB12 No results found for: TSH  ASSESSMENT AND PLAN 73 y.o. year old female  has a past medical history of Depression, Diabetes mellitus without complication ([place]), GERD (gastroesophageal reflux disease), Hyperlipemia, Multiple sclerosis ([employer]), and Osteoporosis. here with:  1.  Multiple sclerosis, likely secondary progressive -She has stopped long-term immunomodulating therapy since 2014 -Last MRI of the brain, cervical, thoracic spine in November 2018 -Continue baclofen, gabapentin, and Wellbutrin, take evening dose of baclofen and gabapentin to see if it helps with right ankle pain, also try repositioning -I will order home health to see if any additional services are available for physical therapy, social work, CNA for assistance with ADLs -Follow-up in 6 months or sooner if needed  I spent 15 minutes with the patient. 50% of this time was spent discussing her plan of care.   Butler Denmark, AGNP-C, DNP 08/30/2019, 2:11 PM [facility] [address] [place], [place] [address] [phone]

## 2019-08-30 NOTE — Patient Instructions (Signed)
Continue current medications. I will order home health. Return in 6 months

## 2019-09-05 ENCOUNTER — Other Ambulatory Visit: Payer: Self-pay

## 2019-09-05 MED ORDER — FREESTYLE LANCETS MISC: 12 refills | Status: DC

## 2019-09-06 ENCOUNTER — Other Ambulatory Visit: Payer: Self-pay | Admitting: Family Medicine

## 2019-09-07 ENCOUNTER — Telehealth: Payer: Self-pay | Admitting: Neurology

## 2019-09-07 NOTE — Telephone Encounter (Signed)
I tried to call the patient twice she did not answer. We talked about her ankle pain in our visit, she said repositioning helped to alleviate the patient. She may take an extra gabapentin at bedtime if needed (that would be 900 mg at bedtime, if having a bad night). We decided she was going to take baclofen and gabapentin as prescribed. I also ordered home health evaluation, b/c she felt she needed more help with repositioning in the bed, often causing her ankle to hurt, possible assistive devices?

## 2019-09-07 NOTE — Telephone Encounter (Signed)
I called pt.  She stated that last night first time since 08-30-19 when she saw you that she had the R leg (ankle to calf) tingling then sharp pain (like knife stabbing or electrical shock) every 10 mins until she fell asleep at 0600.  She is taking the gabapentin 600mg  am and qhs, as well as baclofen 30mg  am the 20 mg qhs.  Please advise.

## 2019-09-07 NOTE — Telephone Encounter (Signed)
Pt called wanting to inform provider that she was not able to sleep last night due to the "shooting electrical pain" she was feeling in her R leg and she is wanting to know if her medications can be increased or what is advised.

## 2019-09-11 NOTE — Progress Notes (Signed)
I have reviewed and agreed above plan. 

## 2019-09-30 ENCOUNTER — Emergency Department (HOSPITAL_COMMUNITY): Payer: Medicare Other

## 2019-09-30 ENCOUNTER — Inpatient Hospital Stay (HOSPITAL_COMMUNITY)
Admission: EM | Admit: 2019-09-30 | Discharge: 2019-10-03 | DRG: 682 | Disposition: A | Discharge status: Home Health | Payer: Medicare Other | Attending: Family Medicine | Admitting: Family Medicine

## 2019-09-30 ENCOUNTER — Encounter (HOSPITAL_COMMUNITY): Payer: Self-pay | Admitting: Emergency Medicine

## 2019-09-30 ENCOUNTER — Other Ambulatory Visit: Payer: Self-pay

## 2019-09-30 DIAGNOSIS — Z79899 Other long term (current) drug therapy: Secondary | ICD-10-CM

## 2019-09-30 DIAGNOSIS — Z8249 Family history of ischemic heart disease and other diseases of the circulatory system: Secondary | ICD-10-CM

## 2019-09-30 DIAGNOSIS — Z9359 Other cystostomy status: Secondary | ICD-10-CM

## 2019-09-30 DIAGNOSIS — Z20828 Contact with and (suspected) exposure to other viral communicable diseases: Secondary | ICD-10-CM | POA: Diagnosis present

## 2019-09-30 DIAGNOSIS — E1140 Type 2 diabetes mellitus with diabetic neuropathy, unspecified: Secondary | ICD-10-CM | POA: Diagnosis present

## 2019-09-30 DIAGNOSIS — N179 Acute kidney failure, unspecified: Principal | ICD-10-CM | POA: Diagnosis present

## 2019-09-30 DIAGNOSIS — Z794 Long term (current) use of insulin: Secondary | ICD-10-CM

## 2019-09-30 DIAGNOSIS — G9340 Encephalopathy, unspecified: Secondary | ICD-10-CM | POA: Diagnosis not present

## 2019-09-30 DIAGNOSIS — N319 Neuromuscular dysfunction of bladder, unspecified: Secondary | ICD-10-CM | POA: Diagnosis present

## 2019-09-30 DIAGNOSIS — K219 Gastro-esophageal reflux disease without esophagitis: Secondary | ICD-10-CM | POA: Diagnosis present

## 2019-09-30 DIAGNOSIS — Z833 Family history of diabetes mellitus: Secondary | ICD-10-CM

## 2019-09-30 DIAGNOSIS — E860 Dehydration: Secondary | ICD-10-CM | POA: Diagnosis present

## 2019-09-30 DIAGNOSIS — E1165 Type 2 diabetes mellitus with hyperglycemia: Secondary | ICD-10-CM | POA: Diagnosis present

## 2019-09-30 DIAGNOSIS — F329 Major depressive disorder, single episode, unspecified: Secondary | ICD-10-CM | POA: Diagnosis present

## 2019-09-30 DIAGNOSIS — Y92009 Unspecified place in unspecified non-institutional (private) residence as the place of occurrence of the external cause: Secondary | ICD-10-CM

## 2019-09-30 DIAGNOSIS — Z791 Long term (current) use of non-steroidal anti-inflammatories (NSAID): Secondary | ICD-10-CM

## 2019-09-30 DIAGNOSIS — G92 Toxic encephalopathy: Secondary | ICD-10-CM | POA: Diagnosis present

## 2019-09-30 DIAGNOSIS — R4182 Altered mental status, unspecified: Secondary | ICD-10-CM | POA: Diagnosis present

## 2019-09-30 DIAGNOSIS — Z800 Family history of malignant neoplasm of digestive organs: Secondary | ICD-10-CM

## 2019-09-30 DIAGNOSIS — Z87891 Personal history of nicotine dependence: Secondary | ICD-10-CM

## 2019-09-30 DIAGNOSIS — N390 Urinary tract infection, site not specified: Secondary | ICD-10-CM

## 2019-09-30 DIAGNOSIS — E785 Hyperlipidemia, unspecified: Secondary | ICD-10-CM | POA: Diagnosis present

## 2019-09-30 DIAGNOSIS — Z993 Dependence on wheelchair: Secondary | ICD-10-CM

## 2019-09-30 DIAGNOSIS — G35 Multiple sclerosis: Secondary | ICD-10-CM | POA: Diagnosis present

## 2019-09-30 DIAGNOSIS — Z87440 Personal history of urinary (tract) infections: Secondary | ICD-10-CM

## 2019-09-30 DIAGNOSIS — Z9114 Patient's other noncompliance with medication regimen: Secondary | ICD-10-CM

## 2019-09-30 DIAGNOSIS — T6591XA Toxic effect of unspecified substance, accidental (unintentional), initial encounter: Secondary | ICD-10-CM | POA: Diagnosis present

## 2019-09-30 DIAGNOSIS — M810 Age-related osteoporosis without current pathological fracture: Secondary | ICD-10-CM | POA: Diagnosis present

## 2019-09-30 DIAGNOSIS — R0981 Nasal congestion: Secondary | ICD-10-CM | POA: Diagnosis present

## 2019-09-30 LAB — POCT I-STAT EG7
Bicarbonate: 27.7 mmol/L (ref 20.0–28.0)
Calcium, Ion: 1.19 mmol/L (ref 1.15–1.40)
HCT: 37 % (ref 36.0–46.0)
Hemoglobin: 12.6 g/dL (ref 12.0–15.0)
O2 Saturation: 65 %
Potassium: 4.1 mmol/L (ref 3.5–5.1)
Sodium: 143 mmol/L (ref 135–145)
TCO2: 29 mmol/L (ref 22–32)
pCO2, Ven: 56.4 mmHg (ref 44.0–60.0)
pH, Ven: 7.299 (ref 7.250–7.430)
pO2, Ven: 38 mmHg (ref 32.0–45.0)

## 2019-09-30 LAB — RAPID URINE DRUG SCREEN, HOSP PERFORMED
Amphetamines: NOT DETECTED
Barbiturates: NOT DETECTED
Benzodiazepines: NOT DETECTED
Cocaine: NOT DETECTED
Opiates: NOT DETECTED
Tetrahydrocannabinol: NOT DETECTED

## 2019-09-30 LAB — CBC
HCT: 42.2 % (ref 36.0–46.0)
Hemoglobin: 12.9 g/dL (ref 12.0–15.0)
MCH: 26.8 pg (ref 26.0–34.0)
MCHC: 30.6 g/dL (ref 30.0–36.0)
MCV: 87.7 fL (ref 80.0–100.0)
Platelets: 417 10*3/uL — ABNORMAL HIGH (ref 150–400)
RBC: 4.81 MIL/uL (ref 3.87–5.11)
RDW: 13.7 % (ref 11.5–15.5)
WBC: 10.5 10*3/uL (ref 4.0–10.5)
nRBC: 0 % (ref 0.0–0.2)

## 2019-09-30 LAB — ETHANOL: Alcohol, Ethyl (B): <10 mg/dL (ref <10)

## 2019-09-30 LAB — URINALYSIS, ROUTINE W REFLEX MICROSCOPIC
Bilirubin Urine: NEGATIVE
Glucose, UA: NEGATIVE mg/dL
Ketones, ur: NEGATIVE mg/dL
Nitrite: NEGATIVE
Protein, ur: 100 mg/dL — AB
Specific Gravity, Urine: 1.019 (ref 1.005–1.030)
WBC, UA: >50 WBC/hpf — ABNORMAL HIGH (ref 0–5)
pH: 7 (ref 5.0–8.0)

## 2019-09-30 LAB — COMPREHENSIVE METABOLIC PANEL
ALT: 14 U/L (ref 0–44)
AST: 17 U/L (ref 15–41)
Albumin: 3.6 g/dL (ref 3.5–5.0)
Alkaline Phosphatase: 122 U/L (ref 38–126)
Anion gap: 11 (ref 5–15)
BUN: 32 mg/dL — ABNORMAL HIGH (ref 8–23)
CO2: 26 mmol/L (ref 22–32)
Calcium: 9.3 mg/dL (ref 8.9–10.3)
Chloride: 104 mmol/L (ref 98–111)
Creatinine, Ser: 1.34 mg/dL — ABNORMAL HIGH (ref 0.44–1.00)
GFR calc Af Amer: 45 mL/min — ABNORMAL LOW (ref >60)
GFR calc non Af Amer: 39 mL/min — ABNORMAL LOW (ref >60)
Glucose, Bld: 128 mg/dL — ABNORMAL HIGH (ref 70–99)
Potassium: 4.3 mmol/L (ref 3.5–5.1)
Sodium: 141 mmol/L (ref 135–145)
Total Bilirubin: 0.1 mg/dL — ABNORMAL LOW (ref 0.3–1.2)
Total Protein: 7.3 g/dL (ref 6.5–8.1)

## 2019-09-30 LAB — ACETAMINOPHEN LEVEL: Acetaminophen (Tylenol), Serum: <10 ug/mL — ABNORMAL LOW (ref 10–30)

## 2019-09-30 LAB — SALICYLATE LEVEL: Salicylate Lvl: <7 mg/dL (ref 2.8–30.0)

## 2019-09-30 LAB — CBG MONITORING, ED: Glucose-Capillary: 141 mg/dL — ABNORMAL HIGH (ref 70–99)

## 2019-09-30 MED ORDER — ONDANSETRON HCL 4 MG/2ML IJ SOLN
INTRAMUSCULAR | Status: AC
  Filled 2019-09-30: qty 2

## 2019-09-30 MED ORDER — SODIUM CHLORIDE 0.9% FLUSH: 3.0000 mL | Freq: Once | INTRAVENOUS | Status: DC

## 2019-09-30 MED ORDER — SODIUM CHLORIDE 0.9 % IV SOLN
1.0000 g | Freq: Once | INTRAVENOUS | Status: AC
  Administered 2019-09-30: 1 g via INTRAVENOUS
  Filled 2019-09-30: qty 10

## 2019-09-30 MED ORDER — NALOXONE HCL 0.4 MG/ML IJ SOLN
0.4000 mg | Freq: Once | INTRAMUSCULAR | Status: AC
  Administered 2019-09-30: 0.4 mg via INTRAVENOUS
  Filled 2019-09-30: qty 1

## 2019-09-30 MED ORDER — ONDANSETRON HCL 4 MG/2ML IJ SOLN
4.0000 mg | Freq: Once | INTRAMUSCULAR | Status: AC
  Administered 2019-09-30: 4 mg via INTRAVENOUS

## 2019-09-30 MED ORDER — SODIUM CHLORIDE 0.9 % IV BOLUS
500.0000 mL | Freq: Once | INTRAVENOUS | Status: AC
  Administered 2019-10-01: 500 mL via INTRAVENOUS
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
## 2019-09-30 NOTE — ED Notes (Signed)
Pt returned from [place] with RN on CCM

## 2019-09-30 NOTE — ED Notes (Signed)
Caregiver- cherisse carty, would like an update when possible at [phone], said do not call son

## 2019-10-01 DIAGNOSIS — R0981 Nasal congestion: Secondary | ICD-10-CM | POA: Diagnosis present

## 2019-10-01 DIAGNOSIS — Z791 Long term (current) use of non-steroidal anti-inflammatories (NSAID): Secondary | ICD-10-CM | POA: Diagnosis not present

## 2019-10-01 DIAGNOSIS — K219 Gastro-esophageal reflux disease without esophagitis: Secondary | ICD-10-CM | POA: Diagnosis present

## 2019-10-01 DIAGNOSIS — Z87891 Personal history of nicotine dependence: Secondary | ICD-10-CM | POA: Diagnosis not present

## 2019-10-01 DIAGNOSIS — Z9359 Other cystostomy status: Secondary | ICD-10-CM | POA: Diagnosis not present

## 2019-10-01 DIAGNOSIS — Z79899 Other long term (current) drug therapy: Secondary | ICD-10-CM | POA: Diagnosis not present

## 2019-10-01 DIAGNOSIS — F329 Major depressive disorder, single episode, unspecified: Secondary | ICD-10-CM | POA: Diagnosis present

## 2019-10-01 DIAGNOSIS — G35 Multiple sclerosis: Secondary | ICD-10-CM | POA: Diagnosis present

## 2019-10-01 DIAGNOSIS — Z833 Family history of diabetes mellitus: Secondary | ICD-10-CM | POA: Diagnosis not present

## 2019-10-01 DIAGNOSIS — E1140 Type 2 diabetes mellitus with diabetic neuropathy, unspecified: Secondary | ICD-10-CM | POA: Diagnosis present

## 2019-10-01 DIAGNOSIS — G92 Toxic encephalopathy: Secondary | ICD-10-CM | POA: Diagnosis present

## 2019-10-01 DIAGNOSIS — G9340 Encephalopathy, unspecified: Secondary | ICD-10-CM | POA: Diagnosis not present

## 2019-10-01 DIAGNOSIS — E1165 Type 2 diabetes mellitus with hyperglycemia: Secondary | ICD-10-CM | POA: Diagnosis present

## 2019-10-01 DIAGNOSIS — E785 Hyperlipidemia, unspecified: Secondary | ICD-10-CM | POA: Diagnosis present

## 2019-10-01 DIAGNOSIS — Z20828 Contact with and (suspected) exposure to other viral communicable diseases: Secondary | ICD-10-CM | POA: Diagnosis present

## 2019-10-01 DIAGNOSIS — Z9114 Patient's other noncompliance with medication regimen: Secondary | ICD-10-CM | POA: Diagnosis not present

## 2019-10-01 DIAGNOSIS — M810 Age-related osteoporosis without current pathological fracture: Secondary | ICD-10-CM | POA: Diagnosis present

## 2019-10-01 DIAGNOSIS — Z87440 Personal history of urinary (tract) infections: Secondary | ICD-10-CM | POA: Diagnosis not present

## 2019-10-01 DIAGNOSIS — Z794 Long term (current) use of insulin: Secondary | ICD-10-CM | POA: Diagnosis not present

## 2019-10-01 DIAGNOSIS — E860 Dehydration: Secondary | ICD-10-CM | POA: Diagnosis present

## 2019-10-01 DIAGNOSIS — R4182 Altered mental status, unspecified: Secondary | ICD-10-CM | POA: Diagnosis present

## 2019-10-01 DIAGNOSIS — Z993 Dependence on wheelchair: Secondary | ICD-10-CM | POA: Diagnosis not present

## 2019-10-01 DIAGNOSIS — N179 Acute kidney failure, unspecified: Secondary | ICD-10-CM | POA: Diagnosis present

## 2019-10-01 DIAGNOSIS — Z800 Family history of malignant neoplasm of digestive organs: Secondary | ICD-10-CM | POA: Diagnosis not present

## 2019-10-01 DIAGNOSIS — T6591XA Toxic effect of unspecified substance, accidental (unintentional), initial encounter: Secondary | ICD-10-CM | POA: Diagnosis present

## 2019-10-01 DIAGNOSIS — Y92009 Unspecified place in unspecified non-institutional (private) residence as the place of occurrence of the external cause: Secondary | ICD-10-CM | POA: Diagnosis not present

## 2019-10-01 DIAGNOSIS — Z8249 Family history of ischemic heart disease and other diseases of the circulatory system: Secondary | ICD-10-CM | POA: Diagnosis not present

## 2019-10-01 LAB — GLUCOSE, CAPILLARY
Glucose-Capillary: 99 mg/dL (ref 70–99)
Glucose-Capillary: 94 mg/dL (ref 70–99)
Glucose-Capillary: 76 mg/dL (ref 70–99)
Glucose-Capillary: 88 mg/dL (ref 70–99)
Glucose-Capillary: 83 mg/dL (ref 70–99)

## 2019-10-01 LAB — HEMOGLOBIN A1C
Hgb A1c MFr Bld: 6.4 % — ABNORMAL HIGH (ref 4.8–5.6)
Mean Plasma Glucose: 136.98 mg/dL

## 2019-10-01 LAB — TSH: TSH: 3.807 u[IU]/mL (ref 0.350–4.500)

## 2019-10-01 LAB — LACTIC ACID, PLASMA: Lactic Acid, Venous: 2.2 mmol/L (ref 0.5–1.9)

## 2019-10-01 LAB — SARS CORONAVIRUS 2 (TAT 6-24 HRS): SARS Coronavirus 2: NEGATIVE

## 2019-10-01 MED ORDER — MIRABEGRON ER 25 MG PO TB24
50.0000 mg | ORAL_TABLET | Freq: Every day | ORAL | Status: DC
  Administered 2019-10-02 – 2019-10-03 (×2): 50 mg via ORAL
  Filled 2019-10-01 (×2): qty 2
  Filled 2019-10-01: qty 1

## 2019-10-01 MED ORDER — POLYETHYLENE GLYCOL 3350 17 G PO PACK: 17.0000 g | PACK | Freq: Every day | ORAL | Status: DC | PRN

## 2019-10-01 MED ORDER — SODIUM CHLORIDE 0.9 % IV SOLN
INTRAVENOUS | Status: DC
  Administered 2019-10-01 – 2019-10-02 (×3): via INTRAVENOUS

## 2019-10-01 MED ORDER — ACETAMINOPHEN 325 MG PO TABS
650.0000 mg | ORAL_TABLET | Freq: Four times a day (QID) | ORAL | Status: DC | PRN
  Administered 2019-10-03: 650 mg via ORAL
  Filled 2019-10-01: qty 2

## 2019-10-01 MED ORDER — INSULIN GLARGINE 100 UNIT/ML ~~LOC~~ SOLN
15.0000 [IU] | Freq: Every day | SUBCUTANEOUS | Status: DC
  Administered 2019-10-01: 15 [IU] via SUBCUTANEOUS
  Filled 2019-10-01 (×5): qty 0.15

## 2019-10-01 MED ORDER — ENOXAPARIN SODIUM 40 MG/0.4ML ~~LOC~~ SOLN
40.0000 mg | Freq: Every day | SUBCUTANEOUS | Status: DC
  Administered 2019-10-01 – 2019-10-03 (×3): 40 mg via SUBCUTANEOUS
  Filled 2019-10-01 (×3): qty 0.4

## 2019-10-01 MED ORDER — ACETAMINOPHEN 650 MG RE SUPP: 650.0000 mg | Freq: Four times a day (QID) | RECTAL | Status: DC | PRN

## 2019-10-01 MED ORDER — BISACODYL 10 MG RE SUPP: 10.0000 mg | Freq: Every day | RECTAL | Status: DC | PRN

## 2019-10-01 MED ORDER — INSULIN ASPART 100 UNIT/ML ~~LOC~~ SOLN: 0.0000 [IU] | Freq: Three times a day (TID) | SUBCUTANEOUS | Status: DC

## 2019-10-01 MED ORDER — BUPROPION HCL 75 MG PO TABS
75.0000 mg | ORAL_TABLET | Freq: Every day | ORAL | Status: DC
  Administered 2019-10-02 – 2019-10-03 (×2): 75 mg via ORAL
  Filled 2019-10-01 (×3): qty 1
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
## 2019-10-01 NOTE — Progress Notes (Signed)
Went to see Claudia Walter this evening she was lying in bed with her eyes closed but responded to her name.  She is aware of the month, but stumbles over the year.  She knows that the president is Daisy Floro.  She knows that she is in a hospital but does not know which hospital.  She will occasionally open her eyes to her name but will not keep her eyes open during the duration of the conversation.  She says that she is tired but does not know why.  At this time she does not have any questions or concerns.

## 2019-10-01 NOTE — Evaluation (Signed)
Occupational Therapy Evaluation Patient Details Name: Claudia Walter MRN: [id number] DOB: [birth year] Today's Date: 10/01/2019    History of Present Illness 73 y.o. female presenting with acute AMS.  CT negative for acute changes. COVID test negative. PMH is significant for MS, suprapubic catheter, HLD, anemia, GERD, h/o pressure injury, T2DM, and social discord.   Clinical Impression   PTA, pt was living with her son and had a caregiver who assisted with all ADLs at bedlevel; reports that caregiver assists with transfers to/from w/c. Unsure of reliability due to current cognitive status and confusion. Pt currently requiring Max-Total A for bathing, dressing, and toileting at bedlevel. Pt with decreased arousal upon arrival and benefited from upright posture. Pt answering simple questions and follow one step commands. Pt performing bed mobility with Mod A for rolling and then Max A +2 for transitioning to/from EOB. Pt would benefit from further acute OT to facilitate safe dc. Pending family support and assist, recommend dc to SNF for further OT to optimize safety, independence with ADLs, and return to PLOF.      Follow Up Recommendations  SNF;Supervision/Assistance - 24 hour    Equipment Recommendations  None recommended by OT    Recommendations for Other Services PT consult     Precautions / Restrictions Precautions Precautions: Fall;Other (comment) Precaution Comments: MS      Mobility Bed Mobility Overal bed mobility: Needs Assistance Bed Mobility: Rolling;Sidelying to Sit;Sit to Supine Rolling: Mod assist Sidelying to sit: Max assist;+2 for physical assistance   Sit to supine: Max assist;+2 for physical assistance   General bed mobility comments: Pt initiating reach towards bedrails to roll for placement of bed pad. Pt requiring Mod A to achieve full roll onto side. Pt requiring Max A +2 to elevate trunk and bring BLEs over EOB. Pt also requiring Max A +2 to lower trunk and bring  BLEs over EOB in returning to supine.   Transfers                 General transfer comment: defered    Balance Overall balance assessment: Needs assistance Sitting-balance support: Bilateral upper extremity supported;Feet supported Sitting balance-Leahy Scale: Poor Sitting balance - Comments: Pt able to hold therapists hands to pull trunk into upright posture. Fatiues quickly and required Max A for support from behind to maintain sitting upright                                   ADL either performed or assessed with clinical judgement   ADL Overall ADL's : Needs assistance/impaired Eating/Feeding: NPO   Grooming: Wash/dry face;Minimal assistance;Maximal assistance;Sitting Grooming Details (indicate cue type and reason): Pt requiring Max A for sitting at EOB. Pt requiring Min A to stabilize UEs to wash her face. Pt able to bring her hands to her face but fatigues quickly. Presenting as if ataxia with jerkly movements as she fatigued Upper Body Bathing: Maximal assistance;Bed level   Lower Body Bathing: Total assistance;Bed level   Upper Body Dressing : Maximal assistance;Bed level   Lower Body Dressing: Total assistance;Bed level       Toileting- Clothing Manipulation and Hygiene: Moderate assistance;Total assistance;Bed level Toileting - Clothing Manipulation Details (indicate cue type and reason): Total A for peri care. Able to roll in bed with Mod A       General ADL Comments: Pt requiring Max-Total A for toileting, dressing, and bathing at bed level. Pt requiring Min  A for grooming while sitting at EOB for Max A for balance.      Vision         Perception     Praxis      Pertinent Vitals/Pain Pain Assessment: Faces Faces Pain Scale: Hurts even more Pain Location: LLE Pain Descriptors / Indicators: Grimacing;Guarding;Moaning Pain Intervention(s): Monitored during session;Repositioned     Hand Dominance     Extremity/Trunk Assessment  Upper Extremity Assessment Upper Extremity Assessment: Generalized weakness   Lower Extremity Assessment Lower Extremity Assessment: Defer to PT evaluation   Cervical / Trunk Assessment Cervical / Trunk Assessment: Other exceptions Cervical / Trunk Exceptions: Increased body habitus   Communication Communication Communication: No difficulties   Cognition Arousal/Alertness: Lethargic Behavior During Therapy: Flat affect Overall Cognitive Status: Impaired/Different from baseline Area of Impairment: Orientation;Attention;Memory;Following commands;Safety/judgement;Awareness;Problem solving                 Orientation Level: Disoriented to;Time;Place;Situation Current Attention Level: Sustained Memory: Decreased short-term memory Following Commands: Follows one step commands inconsistently;Follows one step commands with increased time Safety/Judgement: Decreased awareness of deficits Awareness: Intellectual Problem Solving: Slow processing;Requires verbal cues;Requires tactile cues;Difficulty sequencing General Comments: Pt with decreased arousal upon arrival and required increased cues for participation. Pt agreeable to therapy and sitting at EOB. Demosntrated good initation and following of cues for rolling at bed level. Once sitting at EOB, pt presenting with increased arousal and maintained eye open; however, fatigued quickly. Pt answering simple, direct questions about home. Oriented pt to location of [facility] and why; pt repeating back to therapist but unsure of understanding.    General Comments  SpO2 95% on RA.    Exercises     Shoulder Instructions      Home Living Family/patient expects to be discharged to:: Private residence Living Arrangements: Children;Non-relatives/Friends(Son and son's girlfriend) Available Help at Discharge: Family;Available 24 hours/day Type of Home: House Home Access: Ramped entrance     Home Layout: One level          Biochemist, clinical: Standard     Home Equipment: Wheelchair - Education administrator (comment)(Sliding board)          Prior Functioning/Environment Level of Independence: Needs assistance  Gait / Transfers Assistance Needed: Pt reports that family transfers her to w/c ADL's / Homemaking Assistance Needed: ADLs at bed level with family A   Comments: Son's girlfriend is pt's caregiver Psychologist, educational)        OT Problem List: Decreased strength;Decreased range of motion;Decreased activity tolerance;Impaired balance (sitting and/or standing);Decreased safety awareness;Decreased knowledge of use of DME or AE;Decreased knowledge of precautions;Decreased cognition      OT Treatment/Interventions: Self-care/ADL training;Therapeutic exercise;Energy conservation;DME and/or AE instruction;Therapeutic activities;Patient/family education    OT Goals(Current goals can be found in the care plan section) Acute Rehab OT Goals Patient Stated Goal: Unstated OT Goal Formulation: With patient Time For Goal Achievement: 10/15/19 Potential to Achieve Goals: Good  OT Frequency: Min 2X/week   Barriers to D/C:            Co-evaluation PT/OT/SLP Co-Evaluation/Treatment: Yes Reason for Co-Treatment: For patient/therapist safety;To address functional/ADL transfers   OT goals addressed during session: ADL's and self-care      AM-PAC OT "6 Clicks" Daily Activity     Outcome Measure Help from another person eating meals?: None Help from another person taking care of personal grooming?: A Little Help from another person toileting, which includes using toliet, bedpan, or urinal?: Total Help from another person bathing (including washing, rinsing,  drying)?: Total Help from another person to put on and taking off regular upper body clothing?: A Lot Help from another person to put on and taking off regular lower body clothing?: Total 6 Click Score: 12   End of Session Nurse Communication: Mobility status  Activity  Tolerance: Patient tolerated treatment well Patient left: in bed;with call bell/phone within reach;with bed alarm set  OT Visit Diagnosis: Unsteadiness on feet (R26.81);Other abnormalities of gait and mobility (R26.89);Muscle weakness (generalized) (M62.81)                Time: [id number] OT Time Calculation (min): 21 min Charges:  OT General Charges $OT Visit: 1 Visit OT Evaluation $OT Eval Moderate Complexity: [place], OTR/L Acute Rehab Pager: [phone] Office: [place] 10/01/2019, 4:22 PM

## 2019-10-01 NOTE — ED Provider Notes (Signed)
Case ois discussed with Dr. Ky Barban of [facility] service, who agrees to admit the patient.   Delora Fuel, MD [id number] [place]

## 2019-10-01 NOTE — ED Notes (Signed)
Caregiver- cherisse carty, would like an update when possible at [phone], said do not call son

## 2019-10-01 NOTE — Evaluation (Signed)
Physical Therapy Evaluation Patient Details Name: Claudia Walter MRN: [id number] DOB: 09/02/1946 Today's Date: 10/01/2019   History of Present Illness  73 y.o. female presenting with acute AMS.  CT negative for acute changes. COVID test negative. PMH is significant for MS, suprapubic catheter, HLD, anemia, GERD, h/o pressure injury, T2DM, and social discord.  Clinical Impression  Pt presents to PT with deficits in strength, power, functional mobility, balance, endurance, tone, sensation, cognition. Pt with low level of function at baseline per previous admission in August, with pt reporting that she requires significant assistance for transfers and has stopped utilizing sliding board. Pt may be an unreliable historian 2/2 cognitive deficits. Pt will benefit from continued acute PT services to improve sitting balance and transfer quality and reduce caregiver burden.    Follow Up Recommendations No PT follow up;Supervision/Assistance - 24 hour    Equipment Recommendations  Other (comment)(hoyer lift)    Recommendations for Other Services       Precautions / Restrictions Precautions Precautions: Fall;Other (comment) Precaution Comments: MS Restrictions Weight Bearing Restrictions: No      Mobility  Bed Mobility Overal bed mobility: Needs Assistance Bed Mobility: Rolling;Sidelying to Sit;Sit to Supine Rolling: Mod assist Sidelying to sit: Max assist;+2 for physical assistance   Sit to supine: Max assist;+2 for physical assistance   General bed mobility comments: Pt initiating reach towards bedrails to roll for placement of bed pad. Pt requiring Mod A to achieve full roll onto side. Pt requiring Max A +2 to elevate trunk and bring BLEs over EOB. Pt also requiring Max A +2 to lower trunk and bring BLEs over EOB in returning to supine.   Transfers                 General transfer comment: defered  Ambulation/Gait                Stairs            Wheelchair  Mobility    Modified Rankin (Stroke Patients Only)       Balance Overall balance assessment: Needs assistance Sitting-balance support: Bilateral upper extremity supported;Feet supported Sitting balance-Leahy Scale: Poor Sitting balance - Comments: Pt able to hold therapists hands to pull trunk into upright posture. Fatiues quickly and required Max A for support from behind to maintain sitting upright Postural control: Posterior lean                                   Pertinent Vitals/Pain Pain Assessment: Faces Faces Pain Scale: Hurts even more Pain Location: LLE Pain Descriptors / Indicators: Grimacing;Guarding;Moaning Pain Intervention(s): Monitored during session;Repositioned    Home Living Family/patient expects to be discharged to:: Private residence Living Arrangements: Children;Non-relatives/Friends(Son and son's girlfriend) Available Help at Discharge: Family;Available 24 hours/day Type of Home: House Home Access: Ramped entrance     Home Layout: One level Home Equipment: Wheelchair - Education administrator (comment)(Sliding board)      Prior Function Level of Independence: Needs assistance   Gait / Transfers Assistance Needed: Pt reports that family transfers her to w/c  ADL's / Homemaking Assistance Needed: ADLs at bed level with family A  Comments: Son's girlfriend is pt's caregiver Psychologist, educational)     Hand Dominance        Extremity/Trunk Assessment   Upper Extremity Assessment Upper Extremity Assessment: Generalized weakness    Lower Extremity Assessment Lower Extremity Assessment: Defer to PT evaluation RLE Deficits / Details:  RLE knee flexion limited to ~ 20 degrees 2/2 increased extensor tone. Ankle ROM WFL, pt able to wiggle toes but no other AROM noted in RLE RLE Sensation: decreased light touch LLE Deficits / Details: Pt able to wiggle toes, 2/5 ankle strength, 2/5 knee extension, knee and hip flexion limited 2/2 thigh pain LLE Sensation:  decreased light touch    Cervical / Trunk Assessment Cervical / Trunk Assessment: Other exceptions Cervical / Trunk Exceptions: Increased body habitus  Communication   Communication: No difficulties  Cognition Arousal/Alertness: Lethargic Behavior During Therapy: Flat affect Overall Cognitive Status: Impaired/Different from baseline Area of Impairment: Orientation;Attention;Memory;Following commands;Safety/judgement;Awareness;Problem solving                 Orientation Level: Disoriented to;Time;Place;Situation Current Attention Level: Sustained Memory: Decreased short-term memory Following Commands: Follows one step commands inconsistently;Follows one step commands with increased time Safety/Judgement: Decreased awareness of deficits Awareness: Intellectual Problem Solving: Slow processing;Requires verbal cues;Requires tactile cues;Difficulty sequencing General Comments: Pt with decreased arousal upon arrival and required increased cues for participation. Pt agreeable to therapy and sitting at EOB. Demosntrated good initation and following of cues for rolling at bed level. Once sitting at EOB, pt presenting with increased arousal and maintained eye open; however, fatigued quickly. Pt answering simple, direct questions about home. Oriented pt to location of [facility] and why; pt repeating back to therapist but unsure of understanding.       General Comments General comments (skin integrity, edema, etc.): SpO2 95% on RA.    Exercises     Assessment/Plan    PT Assessment Patient needs continued PT services  PT Problem List Decreased strength;Decreased range of motion;Decreased activity tolerance;Decreased balance;Decreased mobility;Decreased coordination;Decreased cognition;Decreased knowledge of use of DME;Decreased safety awareness;Decreased knowledge of precautions;Impaired sensation;Impaired tone       PT Treatment Interventions DME instruction;Functional  mobility training;Therapeutic activities;Therapeutic exercise;Balance training;Neuromuscular re-education;Patient/family education;Wheelchair mobility training    PT Goals (Current goals can be found in the Care Plan section)  Acute Rehab PT Goals Patient Stated Goal: Unstated PT Goal Formulation: With patient Time For Goal Achievement: 10/15/19 Potential to Achieve Goals: Fair Additional Goals Additional Goal #1: Pt will mobilize in manual wheelchair for 50' with modA.    Frequency Min 2X/week   Barriers to discharge        Co-evaluation PT/OT/SLP Co-Evaluation/Treatment: Yes Reason for Co-Treatment: For patient/therapist safety;To address functional/ADL transfers PT goals addressed during session: Mobility/safety with mobility;Balance;Strengthening/ROM OT goals addressed during session: ADL's and self-care       AM-PAC PT "6 Clicks" Mobility  Outcome Measure Help needed turning from your back to your side while in a flat bed without using bedrails?: A Lot Help needed moving from lying on your back to sitting on the side of a flat bed without using bedrails?: Total Help needed moving to and from a bed to a chair (including a wheelchair)?: Total Help needed standing up from a chair using your arms (e.g., wheelchair or bedside chair)?: Total Help needed to walk in hospital room?: Total Help needed climbing 3-5 steps with a railing? : Total 6 Click Score: 7    End of Session Equipment Utilized During Treatment: Oxygen Activity Tolerance: Patient limited by lethargy Patient left: in bed;with call bell/phone within reach;with bed alarm set Nurse Communication: Mobility status PT Visit Diagnosis: Muscle weakness (generalized) (M62.81);Other symptoms and signs involving the nervous system [id number])    Time: [id number] PT Time Calculation (min) (ACUTE ONLY): 21 min   Charges:  PT Evaluation $PT Eval Moderate Complexity: 1 Mod          Zenaida Niece, PT, DPT Acute  Rehabilitation Pager: [phone]   Zenaida Niece 10/01/2019, 5:26 PM

## 2019-10-01 NOTE — ED Notes (Signed)
FM at bedside speaking with caregiver.

## 2019-10-02 DIAGNOSIS — G9340 Encephalopathy, unspecified: Secondary | ICD-10-CM | POA: Diagnosis not present

## 2019-10-02 LAB — URINE CULTURE

## 2019-10-02 LAB — GLUCOSE, CAPILLARY
Glucose-Capillary: 75 mg/dL (ref 70–99)
Glucose-Capillary: 91 mg/dL (ref 70–99)
Glucose-Capillary: 93 mg/dL (ref 70–99)
Glucose-Capillary: 111 mg/dL — ABNORMAL HIGH (ref 70–99)

## 2019-10-02 LAB — BASIC METABOLIC PANEL
Anion gap: 11 (ref 5–15)
BUN: 18 mg/dL (ref 8–23)
CO2: 24 mmol/L (ref 22–32)
Calcium: 8.7 mg/dL — ABNORMAL LOW (ref 8.9–10.3)
Chloride: 108 mmol/L (ref 98–111)
Creatinine, Ser: 0.99 mg/dL (ref 0.44–1.00)
GFR calc Af Amer: >60 mL/min (ref >60)
GFR calc non Af Amer: 56 mL/min — ABNORMAL LOW (ref >60)
Glucose, Bld: 81 mg/dL (ref 70–99)
Potassium: 4.2 mmol/L (ref 3.5–5.1)
Sodium: 143 mmol/L (ref 135–145)

## 2019-10-02 LAB — CBC
HCT: 36.7 % (ref 36.0–46.0)
Hemoglobin: 11.1 g/dL — ABNORMAL LOW (ref 12.0–15.0)
MCH: 26.4 pg (ref 26.0–34.0)
MCHC: 30.2 g/dL (ref 30.0–36.0)
MCV: 87.4 fL (ref 80.0–100.0)
Platelets: 391 10*3/uL (ref 150–400)
RBC: 4.2 MIL/uL (ref 3.87–5.11)
RDW: 13.7 % (ref 11.5–15.5)
WBC: 10 10*3/uL (ref 4.0–10.5)
nRBC: 0 % (ref 0.0–0.2)

## 2019-10-02 LAB — AMMONIA: Ammonia: 22 umol/L (ref 9–35)

## 2019-10-02 MED ORDER — SODIUM CHLORIDE 0.9 % IV SOLN
1.0000 g | INTRAVENOUS | Status: DC
  Administered 2019-10-02: 1 g via INTRAVENOUS
  Filled 2019-10-02: qty 1

## 2019-10-02 MED ORDER — METFORMIN HCL 500 MG PO TABS
1000.0000 mg | ORAL_TABLET | Freq: Every day | ORAL | Status: DC
  Administered 2019-10-02 – 2019-10-03 (×2): 1000 mg via ORAL
  Filled 2019-10-02 (×2): qty 2

## 2019-10-02 MED ORDER — METFORMIN HCL 500 MG PO TABS: 1000.0000 mg | ORAL_TABLET | Freq: Every day | ORAL | Status: DC

## 2019-10-02 MED ORDER — WHITE PETROLATUM EX OINT
TOPICAL_OINTMENT | CUTANEOUS | Status: AC
  Administered 2019-10-03: 05:00:00
  Filled 2019-10-02: qty 28.35

## 2019-10-02 NOTE — Plan of Care (Signed)
  Problem: Education: Goal: Knowledge of General Education information will improve Description Including pain rating scale, medication(s)/side effects and non-pharmacologic comfort measures Outcome: Progressing   

## 2019-10-02 NOTE — Progress Notes (Signed)
Went to see Claudia Walter this evening. Pt was alert and comfortable, and trying to find the tv remote/call button. She said she was hungry and wanted to order food. She was able to name her RN Verdis Frederickson. She was orientated to month, year and knew who the president was. Did not know the date. Denies pain/shortness of breath etc.  Lattie Haw PGY1, [place]

## 2019-10-02 NOTE — Progress Notes (Addendum)
. Family Medicine Teaching Service Daily Progress Note Intern Pager: [phone]  Patient name: Claudia Walter Medical record number: [id number] Date of birth: October 29, 1945 Age: 73 y.o. Gender: female  Primary Care Provider: Gladys Damme, MD Consultants: None Code Status: Full code  Pt Overview and Major Events to Date:  10/01/2019-patient admitted to inpatient teaching service for altered mental status  Assessment and Plan: Claudia Walter is a 73 y.o. female presenting with acute AMS . PMH is significant for MS, suprapubic catheter, HLD, anemia, GERD, h/o pressure injury, T2DM, social discord  Altered mental status Head CT in the emergency department was negative, chest x-ray trauma positive for infection.  History of suprapubic cath and UA with increased WBCs and WBC clumps. Hx of frequent UTIs that lead to transient AMS.  Lactic acid was mildly elevated at 2.2.  Patient also has a history of taking medications incorrectly including the day of admission she had taken double doses of gabapentin.  Evaluation in the ED showed negative UDS, ethanol, acetaminophen, salicylate level.  TSH 3.807 -Urine culture pending -Blood culture pending -Covid screen negative -Strict I's and O's -Hold home sedating medications -PT/OT eval and treat -OT recommend SNF  T2DM Most recent A1c 6.4 on 09/30/2019.  Glucose this morning 83. -Discussed diabetes management on rounds.  Are going to discontinue Lantus and start Metformin 1000 mg -Monitor CBGs -SSI   AKI Creatinine on admission was 1.34 with a baseline around 0.8. -Trend creatinine -Avoid nephrotoxic agents -Encourage p.o. hydration if mental status improves  MS At baseline patient is wheelchair-bound with mild movement of lower extremities and decreased sensation over the right lower extremity secondary to surgery.  Patient also has a neurogenic bladder and suprapubic catheter in place -Frequent neurochecks  FEN/GI: Clear liquid PPx:  Lovenox  Disposition: Pending further evaluation  Subjective:  Patient was doing well this morning.  She was alert and oriented x3.  She has concerns over why her sons have not been to see her but has no issues other than that.  Objective: Temp:  [98.4 F (36.9 C)-99.7 F (37.6 C)] 99.3 F (37.4 C) (12/07 0534) Pulse Rate:  [80-103] 103 (12/07 0534) Resp:  [16-20] 16 (12/07 0534) BP: (116-160)/(59-71) 141/71 (12/07 0534) SpO2:  [92 %-100 %] 92 % (12/07 0534) General: NAD, lying comfortably in bed HEENT: Atraumatic. Normocephalic.  Neck: No cervical lymphadenopathy.  Cardiac: Mild tachycardia in the low 100s, no m/r/g Respiratory: CTAB, normal work of breathing Abdomen: soft, nontender, nondistended, bowel sounds normal, suprapubic catheter in place Skin: warm and dry, no rashes noted Neuro: Alert and oriented x3   Laboratory: Recent Labs  Lab 09/30/19 2223 09/30/19 2332 10/02/19 0731  WBC 10.5  --  10.0  HGB 12.9 12.6 11.1*  HCT 42.2 37.0 36.7  PLT 417*  --  391   Recent Labs  Lab 09/30/19 2223 09/30/19 2332 10/02/19 0731  NA 141 143 143  K 4.3 4.1 4.2  CL 104  --  108  CO2 26  --  24  BUN 32*  --  18  CREATININE 1.34*  --  0.99  CALCIUM 9.3  --  8.7*  PROT 7.3  --   --   BILITOT 0.1*  --   --   ALKPHOS 122  --   --   ALT 14  --   --   AST 17  --   --   GLUCOSE 128*  --  81  Urine tox-negative Ethanol-negative Salicylate-negative UA -Cloudy -Hgb urine dipstick small -  Ketones negative -Leukocytes small -Nitrites negative -Protein 100 -Specific gravity 1.019 -Bacteria rare -Squamous epithelium's 0-5 -WBC >50 Imaging/Diagnostic Tests: Ct Head Wo Contrast  Result Date: 09/30/2019 CLINICAL DATA:  73 year old female with altered mental status. EXAM: CT HEAD WITHOUT CONTRAST TECHNIQUE: Contiguous axial images were obtained from the base of the skull through the vertex without intravenous contrast. COMPARISON:  Head CT dated 06/09/2019. FINDINGS: Brain:  There is mild age-related atrophy and chronic microvascular ischemic changes. There is no acute intracranial hemorrhage. No mass effect or midline shift. No extra-axial fluid collection. Vascular: No hyperdense vessel or unexpected calcification. Skull: Normal. Negative for fracture or focal lesion. Sinuses/Orbits: No acute finding. Other: None IMPRESSION: 1. No acute intracranial hemorrhage. 2. Mild age-related atrophy and chronic microvascular ischemic changes. Electronically Signed   By: Anner Crete M.D.   On: 09/30/2019 23:16   Dg Chest Portable 1 View  Result Date: 09/30/2019 CLINICAL DATA:  Acute mental status change. EXAM: PORTABLE CHEST 1 VIEW COMPARISON:  June 09, 2019 FINDINGS: Mild opacity in the lateral left lung base is identified. The heart, hila, mediastinum, lungs, and pleura are otherwise normal. IMPRESSION: Mild opacity in the lateral left lung base may represent atelectasis or developing infiltrate. Recommend clinical correlation and attention on follow-up. Electronically Signed   By: Dorise Bullion III M.D   On: 09/30/2019 22:59     Gifford Shave, MD 10/02/2019, 9:18 AM PGY-1, [place] Intern pager: [phone], text pages welcome

## 2019-10-02 NOTE — TOC Initial Note (Addendum)
Transition of Care [facility]) - Initial/Assessment Note    Patient Details  Name: Claudia Walter MRN: [id number] Date of Birth: 1945-11-24  [facility] CM/SW Contact:    Alexander Mt, [place] Phone Number: 10/02/2019, 4:39 PM  Clinical Narrative:                 CSW spoke with pt at bedside. Introduced self, role, reason for visit. Per notes pt now more alert and oriented (x3) than she was yesterday upon arrival. Pt from home, confirmed home address and PCP. She lives with her adult son Wynonia Lawman and "daughter in law" [place]. They assist her with care needs but she usually gets around with her power chair. We discussed care team worries about home supports and behaviors observed. Pt states she feels safe at home and denies any needs. She states that "this is the first time," when asked if she has trouble with her medication management. CSW offered to see if pt could be screened for [facility]- she is amenable but wants more information if she is eligible before agreeing.   CSW/TOC team will continue to follow.   Expected Discharge Plan: Home/Self Care Barriers to Discharge: Continued Medical Work up   Patient Goals and CMS Choice Patient states their goals for this hospitalization and ongoing recovery are:: to get back home   Choice offered to / list presented to : Patient  Expected Discharge Plan and Services Expected Discharge Plan: Home/Self Care In-house Referral: Clinical Social Work, [facility] Discharge Planning Services: CM Consult Post Acute Care Choice: NA Living arrangements for the past 2 months: Single Family Home  Prior Living Arrangements/Services Living arrangements for the past 2 months: Single Family Home Lives with:: Adult Children Patient language and need for interpreter reviewed:: Yes(no needs) Do you feel safe going back to the place where you live?: Yes      Need for Family Participation in Patient Care: Yes (Comment) Care giver support system in place?: Yes  (comment) Current home services: DME Criminal Activity/Legal Involvement Pertinent to Current Situation/Hospitalization: No - Comment as needed  Permission Sought/Granted Permission sought to share information with : Family Supports Permission granted to share information with : Yes, Verbal Permission Granted  Share Information with NAME: Cherisse Carty/Kito Lowell granted to share info w AGENCY: [facility]  Permission granted to share info w Relationship: adult children/children's partner  Permission granted to share info w Contact Information: [phone] (Cherisse's number)  Emotional Assessment Appearance:: Appears stated age Attitude/Demeanor/Rapport: Gracious Affect (typically observed): Accepting, Flat, Quiet Orientation: : Oriented to Situation, Oriented to  Time, Oriented to Place, Oriented to Self, Fluctuating Orientation (Suspected and/or reported Sundowners) Alcohol / Substance Use: Not Applicable Psych Involvement: No (comment)  Admission diagnosis:  Acute encephalopathy [id number] Complicated UTI (urinary tract infection) [N39.0] Acute renal failure, unspecified acute renal failure type ([place]) [N17.9] Patient Active Problem List   Diagnosis Date Noted  . AMS (altered mental status) 10/01/2019  . Acute encephalopathy   . Postmenopausal vaginal bleeding 06/20/2019  . Pressure injury of skin 06/09/2019  . Diabetic ketoacidosis without coma associated with type 2 diabetes mellitus ([place])   . Abnormality of gait 11/16/2018  . Gastroesophageal reflux disease   . Hiatal hernia 12/15/2017  . Anemia 12/15/2017  . Multiple sclerosis ([place]) 12/14/2017  . Neurogenic bladder 12/14/2017  . Suprapubic catheter ([place]) 12/14/2017  . Osteoporosis 12/14/2017  . Hyperlipidemia 12/14/2017   PCP:  Gladys Damme, MD Pharmacy:   [facility] [place] -  [address] [place]  [address] Phone: [phone] Fax: [phone]  [facility] [employer], [place] [address] [place] [address] Phone: [phone] Fax: [phone]  [facility] [place], [place] - [address] [place] [address] Phone: [phone] Fax: [phone]    Readmission Risk Interventions Readmission Risk Prevention Plan 10/02/2019  Post Dischage Appt Not Complete  Appt Comments family medicine will schedule f/u for their clinic  Medication Screening Complete  Transportation Screening Complete  Some recent data might be hidden

## 2019-10-02 NOTE — Progress Notes (Signed)
Pt stated that she is hungry, pt is alert and oriented x3, MD ordered to perform bedside swallow and give diet if passes. Claudia Walter from [facility] conducted the [place] swallow screen and patient was able to pass the test and started on clear liquid diet.

## 2019-10-02 NOTE — Progress Notes (Signed)
I had a long discussion with the patient and her son's girlfriend.  The son's girlfriend provided more information regarding what happened with her medications.  Patient reportedly took her nighttime meds between 4 and 5 AM.  She then woke up in the morning between 10 and 11 and took her morning medications.  The patient's son and his girlfriend then left the house until late afternoon when they returned and found the patient essentially unresponsive.  They checked her blood sugar and it was 104.  The girlfriend called 56.  She tells me that the patient's son was adamant that she did not need to come to the hospital is why he was arguing with the police.  The patient's son's girlfriend has concerns regarding the patient's medication management.  She says that she is told her many times that she needs to take them as prescribed but the patient gets confused on when she should take her medications and if she misses a dose she insists that she needs to take it regardless and then take the next dose of scheduled.  He says that she had the conversation with the patient about the fact that she may overdose within a day prior to this event occurring.  I discussed with the patient and her son's girlfriend occupational therapy's recommendations for SNF placement.  I stressed the importance for proper medication compliance but encouraged her that SNF is the best option to ensure she takes her medications when prescribed and she is in a safe environment.  The patient is adamant that she wishes to return home "at least this year" and says that she is open to assisted living facilities next year.  I informed them that I would give them time to discuss the options and will talk with them tomorrow regarding the patient's wishes.

## 2019-10-03 ENCOUNTER — Other Ambulatory Visit: Payer: Self-pay | Admitting: Family Medicine

## 2019-10-03 DIAGNOSIS — G9340 Encephalopathy, unspecified: Secondary | ICD-10-CM | POA: Diagnosis not present

## 2019-10-03 LAB — GLUCOSE, CAPILLARY
Glucose-Capillary: 108 mg/dL — ABNORMAL HIGH (ref 70–99)
Glucose-Capillary: 109 mg/dL — ABNORMAL HIGH (ref 70–99)
Glucose-Capillary: 87 mg/dL (ref 70–99)
Glucose-Capillary: 122 mg/dL — ABNORMAL HIGH (ref 70–99)

## 2019-10-03 MED ORDER — POLYVINYL ALCOHOL 1.4 % OP SOLN
1.0000 [drp] | OPHTHALMIC | Status: DC | PRN
  Administered 2019-10-03: 1 [drp] via OPHTHALMIC
  Filled 2019-10-03: qty 15

## 2019-10-03 MED ORDER — POLYVINYL ALCOHOL 1.4 % OP SOLN: 1.0000 [drp] | OPHTHALMIC | 0 refills | Status: AC | PRN

## 2019-10-03 MED ORDER — GABAPENTIN 300 MG PO CAPS: 300.0000 mg | ORAL_CAPSULE | Freq: Two times a day (BID) | ORAL | 0 refills | Status: DC

## 2019-10-03 MED ORDER — METFORMIN HCL 500 MG PO TABS: 1000.0000 mg | ORAL_TABLET | Freq: Two times a day (BID) | ORAL | 0 refills | Status: DC

## 2019-10-03 NOTE — Progress Notes (Signed)
Patient discharged to home in stable condition. Discharge instruction packet given. [facility] to transport patient. Arman Filter (daughter in Sports coach) notified.

## 2019-10-03 NOTE — Plan of Care (Signed)
  Problem: Education: Goal: Knowledge of General Education information will improve Description: Including pain rating scale, medication(s)/side effects and non-pharmacologic comfort measures Outcome: Adequate for Discharge   

## 2019-10-03 NOTE — TOC Initial Note (Addendum)
Transition of Care [facility]) - Initial/Assessment Note    Patient Details  Name: Claudia Walter MRN: [id number] Date of Birth: 1946-03-22  Transition of [facility]) CM/SW Contact:    Marilu Favre, RN Phone Number: 10/03/2019, 10:38 AM  Clinical Narrative:                  Confirmed face sheet information with patient at bedside.   Discussed PT recommendation for SNF. Patient verbalized understanding, however wants to go home at discharge. Patient lives with son, daughter in law , grandson and D-I-L daughter, all of whom can provide 24 hour assistance. Patient has all DME. Provided medicare.gov list of home health agencies. Patient has had [facility] in past and would like then again. Called Cassie with [facility] awaiting call back.   Paged  MD [phone] for orders and face to face   Cassie with [facility] has accepted referral. PTAR paperwork completed and placed in chart.  Expected Discharge Plan: [place] Barriers to Discharge: Continued Medical Work up   Patient Goals and CMS Choice Patient states their goals for this hospitalization and ongoing recovery are:: to go home CMS Medicare.gov Compare Post Acute Care list provided to:: Patient Choice offered to / list presented to : Patient  Expected Discharge Plan and Services Expected Discharge Plan: [place] In-house Referral: Clinical Social Work, [facility] Discharge Planning Services: CM Consult Post Acute Care Choice: [place] arrangements for the past 2 months: Single Family Home                 DME Arranged: N/A         HH Arranged: PT, OT, RN, Social Work [place] Agency: [facility] [place] Date HH Agency Contacted: 10/03/19 Time [place]: 1034 Representative spoke with at [place] Arrangements/Services Living arrangements for the past 2 months: [place] with:: Adult Children Patient language and need for interpreter  reviewed:: Yes Do you feel safe going back to the place where you live?: Yes      Need for Family Participation in Patient Care: Yes (Comment) Care giver support system in place?: Yes (comment) Current home services: DME Criminal Activity/Legal Involvement Pertinent to Current Situation/Hospitalization: No - Comment as needed  Activities of Daily Living      Permission Sought/Granted Permission sought to share information with : Family Supports Permission granted to share information with : Yes, Verbal Permission Granted  Share Information with NAME: Cherisse Carty/Kito [place] granted to share info w AGENCY: [facility]  Permission granted to share info w Relationship: adult children/children's partner  Permission granted to share info w Contact Information: [phone] (Cherisse's number)  Emotional Assessment Appearance:: Appears stated age Attitude/Demeanor/Rapport: Engaged Affect (typically observed): Accepting Orientation: : Oriented to Self, Oriented to Place, Oriented to  Time, Oriented to Situation Alcohol / Substance Use: Not Applicable Psych Involvement: No (comment)  Admission diagnosis:  Acute encephalopathy [id number] Complicated UTI (urinary tract infection) [N39.0] Acute renal failure, unspecified acute renal failure type [facility]) [N17.9] Patient Active Problem List   Diagnosis Date Noted  . AMS (altered mental status) 10/01/2019  . Acute encephalopathy   . Postmenopausal vaginal bleeding 06/20/2019  . Pressure injury of skin 06/09/2019  . Diabetic ketoacidosis without coma associated with type 2 diabetes mellitus ([place])   . Abnormality of gait 11/16/2018  . Gastroesophageal reflux disease   . Hiatal hernia 12/15/2017  . Anemia 12/15/2017  . Multiple  sclerosis ([place]) 12/14/2017  . Neurogenic bladder 12/14/2017  . Suprapubic catheter ([place]) 12/14/2017  . Osteoporosis 12/14/2017  . Hyperlipidemia 12/14/2017   PCP:  Gladys Damme, MD Pharmacy:    [facility] [place], [place] [address] Phone: [phone] Fax: [phone]  [facility] [place], [place] [address] [place] [address] Phone: [phone] Fax: [phone]  [facility] [place], [place] - [address] [place] [address] Phone: [phone] Fax: [phone]     Social Determinants of Health ([place]) Interventions    Readmission Risk Interventions Readmission Risk Prevention Plan 10/02/2019  Post Dischage Appt Not Complete  Appt Comments family medicine will schedule f/u for their clinic  Medication Screening Complete  Transportation Screening Complete  Some recent data might be hidden

## 2019-10-03 NOTE — Progress Notes (Signed)
Occupational Therapy Treatment Patient Details Name: Ronneka Kelch MRN: [id number] DOB: July 21, 1946 Today's Date: 10/03/2019    History of present illness 73 y.o. female presenting with acute AMS.  CT negative for acute changes. COVID test negative. PMH is significant for MS, suprapubic catheter, HLD, anemia, GERD, h/o pressure injury, T2DM, and social discord.   OT comments  Patient semi-supine upon arrival, agreeable to work with therapy. Patient requires extensive assistance with bed mobility mod to max A x2. Patient does initiate reaching for bed rails and provides effort however due to weakness unable without increased assistance. Once positioned at edge of bed patient maintains sitting balance with B UE use for few seconds then leans posteriorly requiring maximal assist to maintain. Discussed with patient how she typically transfers with her son's girlfriend "she pulls me into the wheel chair" however due to limited sitting balance felt transfer was unsafe at this time therefore returned back to bed with max A x2 for repositioning.    Follow Up Recommendations  SNF;Supervision/Assistance - 24 hour    Equipment Recommendations  None recommended by OT       Precautions / Restrictions Precautions Precautions: Fall Precaution Comments: MS Restrictions Weight Bearing Restrictions: No       Mobility Bed Mobility Overal bed mobility: Needs Assistance Bed Mobility: Supine to Sit;Sit to Supine;Rolling Rolling: Mod assist;+2 for physical assistance Sidelying to sit: Max assist;+2 for physical assistance Supine to sit: Max assist;+2 for physical assistance Sit to supine: Mod assist;+2 for physical assistance   General bed mobility comments: patient initiates reaching for bed rails to assist with bed mobility however pt with significant weakness and is unable to perform without maximal assistance.   Transfers                 General transfer comment: defered    Balance Overall  balance assessment: Needs assistance Sitting-balance support: Bilateral upper extremity supported;Feet supported Sitting balance-Leahy Scale: Zero Sitting balance - Comments: patient able to maintain sitting balance with B UE support for a few seconds before leaning posteriorly and requiring increased assist from therapists Postural control: Posterior lean     Standing balance comment: unable                           ADL either performed or assessed with clinical judgement   ADL Overall ADL's : Needs assistance/impaired     Grooming: Wash/dry face;Maximal assistance;Total assistance;Sitting Grooming Details (indicate cue type and reason): patient has significant difficulty maintaining sitting balance at EOB even with utilizing B UE for balance/support.                              Functional mobility during ADLs: Maximal assistance;+2 for physical assistance                 Cognition Arousal/Alertness: Awake/alert Behavior During Therapy: WFL for tasks assessed/performed Overall Cognitive Status: Within Functional Limits for tasks assessed                                            General Comments patient reports her son's girlfriend helps her squat pivot into her wheelchair "she pulls me into the chair" and uses a slide board to get back into bed.     Pertinent Vitals/ Pain  Pain Assessment: Faces Faces Pain Scale: Hurts a little bit Pain Location: LLE Pain Descriptors / Indicators: Discomfort;Grimacing;Guarding;Moaning Pain Intervention(s): Limited activity within patient's tolerance;Monitored during session;Repositioned         Frequency  Min 2X/week        Progress Toward Goals  OT Goals(current goals can now be found in the care plan section)  Progress towards OT goals: Progressing toward goals  Acute Rehab OT Goals Patient Stated Goal: to try and sit up OT Goal Formulation: With patient Time For Goal  Achievement: 10/15/19 Potential to Achieve Goals: Good ADL Goals Pt Will Perform Eating: with set-up;with supervision;sitting;bed level Pt Will Perform Grooming: with set-up;sitting;bed level Pt Will Perform Toileting - Clothing Manipulation and hygiene: with min assist;with total assist;bed level(rolling in bed with min A and total for peri care) Additional ADL Goal #1: Pt will demonstrate selective attention to perform simple ADL in distracting environment with Min cues Additional ADL Goal #2: Pt will tolerate sitting at EOB for ~10 minutes with Mod A in preparation for ADLs  Plan Discharge plan remains appropriate    Co-evaluation    PT/OT/SLP Co-Evaluation/Treatment: Yes Reason for Co-Treatment: For patient/therapist safety;To address functional/ADL transfers PT goals addressed during session: Mobility/safety with mobility;Balance OT goals addressed during session: ADL's and self-care      AM-PAC OT "6 Clicks" Daily Activity     Outcome Measure   Help from another person eating meals?: None Help from another person taking care of personal grooming?: A Lot Help from another person toileting, which includes using toliet, bedpan, or urinal?: Total Help from another person bathing (including washing, rinsing, drying)?: Total Help from another person to put on and taking off regular upper body clothing?: A Lot Help from another person to put on and taking off regular lower body clothing?: Total 6 Click Score: 11    End of Session    OT Visit Diagnosis: Other abnormalities of gait and mobility (R26.89);Muscle weakness (generalized) (M62.81)   Activity Tolerance Patient tolerated treatment well   Patient Left in bed;with call bell/phone within reach;with bed alarm set   Nurse Communication Mobility status        Time: [id number] OT Time Calculation (min): 23 min  Charges: OT General Charges $OT Visit: 1 Visit OT Treatments $Self Care/Home Management : 8-22 mins  Shon Millet OT OT office: [place] 10/03/2019, 10:30 AM

## 2019-10-03 NOTE — Discharge Instructions (Signed)
Thank you for allowing [place] to participate in your care!    You were admitted for altered mental status. Your gabapentin was decreased to 300 mg twice daily. Your insulin was stopped. Your Metformin was increased to 1000 mg twice daily.  If you experience worsening of your admission symptoms, develop shortness of breath, life threatening emergency, suicidal or homicidal thoughts you must seek medical attention immediately by calling 911 or calling your MD immediately  if symptoms less severe.  Bring all of your medications (ones you are taking and ones that you aren't) with you to your follow up appointment.

## 2019-10-03 NOTE — Progress Notes (Signed)
Physical Therapy Treatment Patient Details Name: Claudia Walter MRN: [id number] DOB: [birth year] Today's Date: 10/03/2019    History of Present Illness 73 y.o. female presenting with acute AMS.  CT negative for acute changes. COVID test negative. PMH is significant for MS, suprapubic catheter, HLD, anemia, GERD, h/o pressure injury, T2DM, and social discord.    PT Comments    Patient received in bed, alert, oriented this morning. Agrees to PT/OT session. Patient requires mod/max assist +2 for all mobility at this time. She is not ambulatory at baseline and uses caregiver and slinding board to assist with transfers to her wheelchair. She will benefit from continued skilled PT while here to improve mobility and strength to reduce caregiver burden. If she will not go to SNF, she would benefit from HHPT to improve mobility and possible obtain lift equipment in the home.       Follow Up Recommendations  SNF;Supervision/Assistance - 24 hour     Equipment Recommendations       Recommendations for Other Services       Precautions / Restrictions Precautions Precautions: Fall Precaution Comments: MS Restrictions Weight Bearing Restrictions: No    Mobility  Bed Mobility Overal bed mobility: Needs Assistance Bed Mobility: Supine to Sit;Sit to Supine;Rolling Rolling: Mod assist;+2 for physical assistance Sidelying to sit: Max assist;+2 for physical assistance Supine to sit: Max assist;+2 for physical assistance Sit to supine: Mod assist;+2 for physical assistance   General bed mobility comments: Pt initiating reach towards bedrails to roll for placement of bed pad. Pt requiring Mod A to achieve full roll onto side. Pt requiring Max A +2 to elevate trunk and bring BLEs over EOB. Pt also requiring Max A +2 to lower trunk and bring BLEs over EOB in returning to supine. Unable to sit unsupported at edge of bed.  Transfers                 General transfer comment:  defered  Ambulation/Gait                 Stairs             Wheelchair Mobility    Modified Rankin (Stroke Patients Only)       Balance Overall balance assessment: Needs assistance Sitting-balance support: Bilateral upper extremity supported;Feet supported Sitting balance-Leahy Scale: Poor Sitting balance - Comments: Pt able to hold therapists hands to pull trunk into upright posture. Fatiues quickly and required Max A for support from behind to maintain sitting upright. Postural control: Posterior lean                                  Cognition Arousal/Alertness: Awake/alert Behavior During Therapy: WFL for tasks assessed/performed Overall Cognitive Status: Within Functional Limits for tasks assessed                         Following Commands: Follows one step commands consistently     Problem Solving: Requires verbal cues;Requires tactile cues General Comments: patient appears to be at baseline. Alert, oriented.      Exercises      General Comments        Pertinent Vitals/Pain Pain Assessment: Faces Faces Pain Scale: Hurts a little bit Pain Location: LLE Pain Descriptors / Indicators: Discomfort;Grimacing;Guarding;Moaning Pain Intervention(s): Limited activity within patient's tolerance;Monitored during session;Repositioned    Home Living  Prior Function            PT Goals (current goals can now be found in the care plan section) Acute Rehab PT Goals Patient Stated Goal: Unstated PT Goal Formulation: With patient Time For Goal Achievement: 10/15/19 Potential to Achieve Goals: Fair Progress towards PT goals: Progressing toward goals    Frequency    Min 2X/week      PT Plan Current plan remains appropriate    Co-evaluation PT/OT/SLP Co-Evaluation/Treatment: Yes Reason for Co-Treatment: For patient/therapist safety;To address functional/ADL transfers PT goals addressed during  session: Mobility/safety with mobility;Balance        AM-PAC PT "6 Clicks" Mobility   Outcome Measure  Help needed turning from your back to your side while in a flat bed without using bedrails?: A Lot Help needed moving from lying on your back to sitting on the side of a flat bed without using bedrails?: Total Help needed moving to and from a bed to a chair (including a wheelchair)?: Total Help needed standing up from a chair using your arms (e.g., wheelchair or bedside chair)?: Total Help needed to walk in hospital room?: Total Help needed climbing 3-5 steps with a railing? : Total 6 Click Score: 7    End of Session   Activity Tolerance: Patient tolerated treatment well Patient left: in bed;with bed alarm set;with call bell/phone within reach Nurse Communication: Mobility status PT Visit Diagnosis: Muscle weakness (generalized) (M62.81);Other abnormalities of gait and mobility (R26.89);Pain Pain - Right/Left: Left Pain - part of body: Hip     Time: 0915-0930 PT Time Calculation (min) (ACUTE ONLY): 15 min  Charges:  $Therapeutic Activity: 8-22 mins                     Loretta Doutt, PT, GCS 10/03/19,10:04 AM

## 2019-10-04 NOTE — Discharge Summary (Addendum)
[facility] Discharge Summary  Patient name: Claudia Walter Medical record number: [id number] Date of birth: October 07, 1946 Age: 73 y.o. Gender: female Date of Admission: 09/30/2019  Date of Discharge: 10/03/2019 Admitting Physician: Dickie La, MD  Primary Care Provider: Gladys Damme, MD Consultants: None  Indication for Hospitalization: AMS  Discharge Diagnoses/Problem List:  Altered mental status T2 DM AKI MS  Disposition: Home  Discharge Condition: Stable  Discharge Exam: Temp:  [98.4 F (36.9 C)-99.7 F (37.6 C)] 99.3 F (37.4 C) (12/07 0534) Pulse Rate:  [80-103] 103 (12/07 0534) Resp:  [16-20] 16 (12/07 0534) BP: (116-160)/(59-71) 141/71 (12/07 0534) SpO2:  [92 %-100 %] 92 % (12/07 0534) General: NAD, lying comfortably in bed HEENT:Atraumatic. Normocephalic.  Neck: No cervical lymphadenopathy.  Cardiac: Mild tachycardia in the low 100s, no m/r/g Respiratory: CTAB, normal work of breathing Abdomen: soft, nontender, nondistended, bowel sounds normal, suprapubic catheter in place Skin: warm and dry, no rashes noted Neuro: Alert and oriented x3  Brief Hospital Course:  Altered mental status Patient presented to the emergency department with acute onset altered mental status.  She was seen approximately 6 hours prior to being brought to the emergency room at baseline.  Per the patient's daughter-in-law the patient stayed up until approximately 5 in the morning and when she went to bed she took her nighttime meds.  She woke up between 10 and 11 and then took her morning medications.  This includes her 600 mg of Neurontin at night and in the morning.  Patient was reportedly semiresponsive and had multiple episodes of vomiting prior to arrival to the emergency department.  Given patient's history of UTIs and her suprapubic cath along with a mildly elevated WBC there was suspicion that the patient may have a UTI and was given ceftriaxone at 2350 112/5.   She was also given a 500 mL bolus of saline.  Lactic acid was mildly elevated at 2.2.  UDS, ethanol, acetaminophen, salicylate level were all within normal limits.  EKG showed normal sinus rhythm without ST changes.  Patient was admitted to the inpatient service for further evaluation.  In the morning of 12/6 patient was more alert and oriented.  She reports that she is unsure why she was admitted to the hospital but she knows she was brought there because she was sleepy.  12/7 the primary team had multiple discussions with the patient along with the patient's daughter-in-law regarding the patient's medication management.  Patient reports she does not take her medications at specific times but rather when she is going to bed and when she wakes up regardless of how to place those altogether.  She will also sometimes forget medications and to catch up take double doses of her medications.  Daughter-in-law is concerned for the patient safety at home.  PT and OT evaluated the patient and both recommended SNF placement.  The patient is adamant that she does not want to go to SNF but she wants to go home.  12/8 patient is alert oriented to person place and time.  The primary team makes medication changes to her home regimen including decreasing her gabapentin to 300 mg twice daily from 600 mg twice daily as well as discontinuing her baclofen.  Patient's daughter-in-law also reports she will be helping manage the patient's medication when she is discharged.  T2DM Most recent hemoglobin A1c was 6.5.  Prior to admission patient was taking 1000 mg metformin daily as well as 15 units of Lantus.  Patient's blood  sugars after arrival remained in the 70s to 80s.  After monitoring her blood sugars primary team decided it was best to stop her Lantus and increase her dose of metformin to 1000 mg twice daily.  She will have close follow-up with her PCP to monitor her sugars.  A1c goal for this patient should be approximately  8.  Issues for Follow Up:  1. Diabetes medication management, discontinued lantus and increased metformin to 1000mg  bid 2. Neuropathy management, gabapentin decreased to 300 mg twice daily and baclofen discontinued  Significant Procedures: None  Significant Labs and Imaging:  Recent Labs  Lab 09/30/19 2223 09/30/19 2332 10/02/19 0731  WBC 10.5  --  10.0  HGB 12.9 12.6 11.1*  HCT 42.2 37.0 36.7  PLT 417*  --  391   Recent Labs  Lab 09/30/19 2223 09/30/19 2332 10/02/19 0731  NA 141 143 143  K 4.3 4.1 4.2  CL 104  --  108  CO2 26  --  24  GLUCOSE 128*  --  81  BUN 32*  --  18  CREATININE 1.34*  --  0.99  CALCIUM 9.3  --  8.7*  ALKPHOS 122  --   --   AST 17  --   --   ALT 14  --   --   ALBUMIN 3.6  --   --    Lactic acid 2.2 Acetaminophen < 10 Salicylate< 7 Covid negative Tox screen negative Urine triple phosphate crystal-present  CT Head Wo Contrast  Result Date: 09/30/2019 CLINICAL DATA:  73 year old female with altered mental status. EXAM: CT HEAD WITHOUT CONTRAST TECHNIQUE: Contiguous axial images were obtained from the base of the skull through the vertex without intravenous contrast. COMPARISON:  Head CT dated 06/09/2019. FINDINGS: Brain: There is mild age-related atrophy and chronic microvascular ischemic changes. There is no acute intracranial hemorrhage. No mass effect or midline shift. No extra-axial fluid collection. Vascular: No hyperdense vessel or unexpected calcification. Skull: Normal. Negative for fracture or focal lesion. Sinuses/Orbits: No acute finding. Other: None IMPRESSION: 1. No acute intracranial hemorrhage. 2. Mild age-related atrophy and chronic microvascular ischemic changes. Electronically Signed   By: Anner Crete M.D.   On: 09/30/2019 23:16   DG Chest Portable 1 View  Result Date: 09/30/2019 CLINICAL DATA:  Acute mental status change. EXAM: PORTABLE CHEST 1 VIEW COMPARISON:  June 09, 2019 FINDINGS: Mild opacity in the lateral left  lung base is identified. The heart, hila, mediastinum, lungs, and pleura are otherwise normal. IMPRESSION: Mild opacity in the lateral left lung base may represent atelectasis or developing infiltrate. Recommend clinical correlation and attention on follow-up. Electronically Signed   By: Dorise Bullion III M.D   On: 09/30/2019 22:59     Results/Tests Pending at Time of Discharge: None  Discharge Medications:  Allergies as of 10/03/2019      Reactions   Sulfa Antibiotics Nausea And Vomiting      Medication List    STOP taking these medications   baclofen 20 MG tablet Commonly known as: LIORESAL   Lantus SoloStar 100 UNIT/ML Solostar Pen Generic drug: Insulin Glargine     TAKE these medications   buPROPion 75 MG tablet Commonly known as: WELLBUTRIN Take 1 tablet (75 mg total) by mouth daily.   freestyle lancets Use as instructed   FREESTYLE LITE test strip Generic drug: glucose blood Use as instructed to test sugar once daily.  Dx code:11.10   gabapentin 300 MG capsule Commonly known as: NEURONTIN Take 1  capsule (300 mg total) by mouth 2 (two) times daily. What changed: how much to take   ibuprofen 600 MG tablet Commonly known as: ADVIL TAKE 1 TABLET(600 MG) BY MOUTH DAILY AS NEEDED What changed: See the new instructions.   metFORMIN 500 MG tablet Commonly known as: GLUCOPHAGE Take 2 tablets (1,000 mg total) by mouth 2 (two) times daily with a meal. What changed: when to take this   multivitamin with minerals tablet Take 1 tablet by mouth daily.   Myrbetriq 50 MG Tb24 tablet Generic drug: mirabegron ER Take 50 mg by mouth daily.   omeprazole 20 MG capsule Commonly known as: PRILOSEC Take 20 mg by mouth daily before breakfast.   Pen Needles 32G X 4 MM Misc 1 each by Does not apply route daily.   polyethylene glycol 17 g packet Commonly known as: MIRALAX / GLYCOLAX Take 17 g by mouth daily as needed for moderate constipation.   polyvinyl alcohol 1.4 %  ophthalmic solution Commonly known as: LIQUIFILM TEARS Place 1 drop into the right eye as needed for dry eyes.   pravastatin 20 MG tablet Commonly known as: PRAVACHOL Take 1 tablet (20 mg total) by mouth daily. What changed: when to take this   vitamin C 500 MG tablet Commonly known as: ASCORBIC ACID Take 1,000 mg by mouth daily.   Vitamin D (Cholecalciferol) 50 MCG (2000 UT) Caps Take 2,000 Units by mouth every morning.       Discharge Instructions: Please refer to Patient Instructions section of EMR for full details.  Patient was counseled important signs and symptoms that should prompt return to medical care, changes in medications, dietary instructions, activity restrictions, and follow up appointments.   Follow-Up Appointments: Follow-up Information    [facility].   Specialty: Home Health Services Contact information: [place] [phone] [place]. Go on 10/05/2019.   Why: @8 :30AM Contact information: [place]          Gifford Shave, MD 10/04/2019, 12:44 PM PGY-1, [place] Upper-Level Resident Addendum   I have discussed the above with the original author and agree with their documentation. My edits for correction/addition/clarification are above. Please see also any attending notes.   Caroline More, DO PGY-3, [facility] 10/05/2019 1:40 PM  FPTS Service pager: [phone] (text pages welcome through [facility])

## 2019-10-05 ENCOUNTER — Ambulatory Visit: Payer: Medicare Other

## 2019-10-06 ENCOUNTER — Telehealth: Payer: Self-pay | Admitting: Family Medicine

## 2019-10-06 LAB — CULTURE, BLOOD (ROUTINE X 2): Culture: NO GROWTH

## 2019-10-06 NOTE — Telephone Encounter (Signed)
Patient by Dr. Erin Hearing that patient had a positive blood culture for gram-positive rods.  Patient called and informed of this, assess for any infectious symptoms.  She denies any fevers or chills.  Does note that she has had 2 episodes of vomiting, once yesterday, once today after eating since being home.  States that otherwise she feels fine.  She has an appointment scheduled in our clinic on 12/15.  Advised the patient if her symptoms worsen, she is unable to tolerate p.o., she should be seen right away in the ED.  Otherwise advised that she could wait until her appointment on 12/15.  This blood cultures likely contaminant, no intervention needed at this time.  [place] Constable, D.O.  PGY-2 Family Medicine  10/06/2019 4:05 PM

## 2019-10-07 LAB — CULTURE, BLOOD (ROUTINE X 2): Special Requests: ADEQUATE

## 2019-10-10 ENCOUNTER — Other Ambulatory Visit: Payer: Self-pay

## 2019-10-10 ENCOUNTER — Ambulatory Visit (INDEPENDENT_AMBULATORY_CARE_PROVIDER_SITE_OTHER): Payer: Medicare Other | Admitting: Family Medicine

## 2019-10-10 ENCOUNTER — Encounter: Payer: Self-pay | Admitting: Family Medicine

## 2019-10-10 VITALS — BP 120/58 | HR 102 | Ht 62.0 in

## 2019-10-10 DIAGNOSIS — N319 Neuromuscular dysfunction of bladder, unspecified: Secondary | ICD-10-CM

## 2019-10-10 DIAGNOSIS — K117 Disturbances of salivary secretion: Secondary | ICD-10-CM

## 2019-10-10 MED ORDER — TOLTERODINE TARTRATE ER 2 MG PO CP24: 2.0000 mg | ORAL_CAPSULE | Freq: Every day | ORAL | 30 refills | Status: AC

## 2019-10-10 NOTE — Assessment & Plan Note (Signed)
-  Continue tolterodine daily

## 2019-10-10 NOTE — Progress Notes (Signed)
   Subjective:    Patient ID: Claudia Walter, female    DOB: [birth year], 73 y.o.   MRN: [id number]   CC: Hospital follow up  HPI: Claudia Walter is a 73 year old female with history of multiple sclerosis, diabetes, GERD, depression, hyperlipidemia, suprapubic catheter, and neurogenic bladder presenting for follow-up for hospitalization.  The patient was hospitalized on 09/30/2019 with altered mental status in the setting of medication noncompliance.  It was recommended at the time of discharge the patient go to SNF; however, she respectfully declined and wished to be discharged home.  The patient reports she is doing much better today but has a couple of concerns.  Medication: The patient had some medications change when she left the hospital.  For example, gabapentin was reduced from 4 tablets down to 2 tablets daily, buproprion is the same, she is happy to be off of insulin.  The patient reports she has been more compliant with her medications, taking them twice daily and not more scheduled times.  Neurogenic Bladder: patient has a neurogenic bladder, possibly related to her MS. She was prescribed Myrbetriq however this was too expensive for her so she was changed to Tolterodine (unsure of dose).  Patient reports she started taking this medication 5-6 days ago and she has had no problems with it since.  She denies any dysuria or changes in urinary frequency/urgency.  Dry mouth: The patient reports that for the last 2 days she has been experiencing dry mouth with a bad taste in her mouth.  She denies having dryness anywhere else (hands, face, eyes, etc). She denies any fevers, rashes, body aches, chest pain, shortness of breath, nausea, vomiting, constipation.   Trouble sleeping: reports occasionally having difficulties falling asleep and is wondering if she could be prescribed medical marijuana.  Also asking if melatonin is okay for her to take.  She denies any difficulties staying asleep.  It is unknown  whether or not she snores, denies waking up in the middle the night gasping for air.  Smoking status reviewed - former smoker  Review of Systems - see HPI   Objective:  BP (!) 120/58   Pulse (!) 102   Ht 5\' 2"  (1.575 m)   SpO2 97%   BMI 26.69 kg/m  Vitals and nursing note reviewed  Physical exam: General: well nourished, in no acute distress, wheelchair-bound HEENT: No pharyngeal erythema or tonsillar exudates Cardiac: RRR, clear S1 and S2, no murmurs appreciated Respiratory: CTA bilaterally, normal work of breathing Abdomen: Soft, nontender, normal bowel sounds appreciated Skin: warm and dry, no rashes noted Neuro: Alert and oriented x3, cooperative with encounter  Assessment & Plan:   Xerostomia DDx for dry mouth could include Sjogren's Syndrome, GERD, Sarcoidosis, and Diabetes, however, her dry mouth is most likely due to taking an anti muscarinic medication (tolterodine) for neurogenic bladder. -Patient recommended to try Biotene to relieve dry mouth.  Could also consider gum drops/lozenges. -Instructed to keep water with her, especially while eating, in case she gets choked up -Patient will evaluate if the dry mouth is too severe for her to continue the tolterodine   Neurogenic bladder -Continue tolterodine daily  Return if symptoms worsen or fail to improve.   Dr. Milus Banister [facility], PGY-2

## 2019-10-10 NOTE — Patient Instructions (Signed)
Thank you for coming in to see [place] today! Please see below to review our plan for today's visit:  1. You can take Melatonin 5mg  each night about 1 hour before bedtime.  2. BioTene for Dry Mouth. Get some gum drops!  Please call the clinic at [phone] if your symptoms worsen or you have any concerns. It was our pleasure to serve you!  Dr. Milus Banister [facility]

## 2019-10-10 NOTE — Assessment & Plan Note (Signed)
DDx for dry mouth could include Sjogren's Syndrome, GERD, Sarcoidosis, and Diabetes, however, her dry mouth is most likely due to taking an anti muscarinic medication (tolterodine) for neurogenic bladder. -Patient recommended to try Biotene to relieve dry mouth.  Could also consider gum drops/lozenges. -Instructed to keep water with her, especially while eating, in case she gets choked up -Patient will evaluate if the dry mouth is too severe for her to continue the tolterodine

## 2019-10-25 ENCOUNTER — Telehealth: Payer: Self-pay

## 2019-10-25 NOTE — Telephone Encounter (Signed)
Dorian Pod from home health agency calling for verbal orders to re-certify need for supra pubic catheters for patient.   Orders can be left for Dorian Pod at [phone]  To PCP  Talbot Grumbling, RN

## 2019-10-25 NOTE — Telephone Encounter (Signed)
Verbal orders given to Dorian Pod from home health agency re: supra-pubic catheter maintenance.  Gladys Damme, MD [place], PGY-1

## 2019-11-28 ENCOUNTER — Other Ambulatory Visit: Payer: Self-pay | Admitting: Family Medicine

## 2020-01-01 ENCOUNTER — Other Ambulatory Visit: Payer: Self-pay | Admitting: Neurology

## 2020-01-15 ENCOUNTER — Other Ambulatory Visit: Payer: Self-pay | Admitting: Family Medicine

## 2020-01-23 ENCOUNTER — Other Ambulatory Visit: Payer: Self-pay | Admitting: *Deleted

## 2020-01-23 MED ORDER — FREESTYLE LITE TEST VI STRP: ORAL_STRIP | 12 refills | Status: DC

## 2020-01-29 ENCOUNTER — Other Ambulatory Visit: Payer: Self-pay

## 2020-01-29 ENCOUNTER — Encounter: Payer: Self-pay | Admitting: Family Medicine

## 2020-01-29 ENCOUNTER — Ambulatory Visit (INDEPENDENT_AMBULATORY_CARE_PROVIDER_SITE_OTHER): Payer: Medicare Other | Admitting: Family Medicine

## 2020-01-29 VITALS — BP 122/68 | HR 105 | Ht 62.0 in

## 2020-01-29 DIAGNOSIS — M65342 Trigger finger, left ring finger: Secondary | ICD-10-CM | POA: Diagnosis not present

## 2020-01-29 DIAGNOSIS — Z794 Long term (current) use of insulin: Secondary | ICD-10-CM | POA: Diagnosis not present

## 2020-01-29 DIAGNOSIS — E118 Type 2 diabetes mellitus with unspecified complications: Secondary | ICD-10-CM

## 2020-01-29 DIAGNOSIS — J301 Allergic rhinitis due to pollen: Secondary | ICD-10-CM | POA: Diagnosis not present

## 2020-01-29 DIAGNOSIS — E1110 Type 2 diabetes mellitus with ketoacidosis without coma: Secondary | ICD-10-CM

## 2020-01-29 DIAGNOSIS — J302 Other seasonal allergic rhinitis: Secondary | ICD-10-CM | POA: Diagnosis not present

## 2020-01-29 LAB — POCT GLYCOSYLATED HEMOGLOBIN (HGB A1C): HbA1c, POC (controlled diabetic range): 5.9 % (ref 0.0–7.0)

## 2020-01-29 MED ORDER — LORATADINE 10 MG PO TABS: 10.0000 mg | ORAL_TABLET | Freq: Every day | ORAL | 11 refills | Status: DC

## 2020-01-29 MED ORDER — FLUTICASONE PROPIONATE 50 MCG/ACT NA SUSP: 2.0000 | Freq: Every day | NASAL | 6 refills | Status: AC

## 2020-01-29 NOTE — Patient Instructions (Signed)
It was a pleasure seeing you today!  1. You have a trigger finger. I have referred you to sports medicine where they can evaluate and treat your finger.  2. You have allergic rhinitis: please take 1 loratadine in the morning as well as spray flonase in each nostril daily. This combination works best when used over 6 weeks.  Be Well!   Trigger Finger  Trigger finger, also called stenosing tenosynovitis,  is a condition that causes a finger to get stuck in a bent position. Each finger has a tendon, which is a tough, cord-like tissue that connects muscle to bone, and each tendon passes through a tunnel of tissue called a tendon sheath. To move your finger, your tendon needs to glide freely through the sheath. Trigger finger happens when the tendon or the sheath thickens, making it difficult to move your finger. Trigger finger can affect any finger or a thumb. It may affect more than one finger. Mild cases may clear up with rest and medicine. Severe cases require more treatment. What are the causes? Trigger finger is caused by a thickened finger tendon or tendon sheath. The cause of this thickening is not known. What increases the risk? The following factors may make you more likely to develop this condition:  Doing activities that require a strong grip.  Having rheumatoid arthritis, gout, or diabetes.  Being 65-22 years old.  Being female. What are the signs or symptoms? Symptoms of this condition include:  Pain when bending or straightening your finger.  Tenderness or swelling where your finger attaches to the palm of your hand.  A lump in the palm of your hand or on the inside of your finger.  Hearing a noise like a pop or a snap when you try to straighten your finger.  Feeling a catching or locking sensation when you try to straighten your finger.  Being unable to straighten your finger. How is this diagnosed? This condition is diagnosed based on your symptoms and a physical  exam. How is this treated? This condition may be treated by:  Resting your finger and avoiding activities that make symptoms worse.  Wearing a finger splint to keep your finger extended.  Taking NSAIDs, such as ibuprofen, to relieve pain and swelling.  Doing gentle exercises to stretch the finger as told by your health care provider.  Having medicine that reduces swelling and inflammation (steroids) injected into the tendon sheath. Injections may need to be repeated.  Having surgery to open the tendon sheath. This may be done if other treatments do not work and you cannot straighten your finger. You may need physical therapy after surgery. Follow these instructions at home: If you have a splint:  Wear the splint as told by your health care provider. Remove it only as told by your health care provider.  Loosen it if your fingers tingle, become numb, or turn cold and blue.  Keep it clean.  If the splint is not waterproof: ? Do not let it get wet. ? Cover it with a watertight covering when you take a bath or shower. Managing pain, stiffness, and swelling     If directed, apply heat to the affected area as often as told by your health care provider. Use the heat source that your health care provider recommends, such as a moist heat pack or a heating pad.  Place a towel between your skin and the heat source.  Leave the heat on for 20-30 minutes.  Remove the heat if  your skin turns bright red. This is especially important if you are unable to feel pain, heat, or cold. You may have a greater risk of getting burned. If directed, put ice on the painful area. To do this:  If you have a removable splint, remove it as told by your health care provider.  Put ice in a plastic bag.  Place a towel between your skin and the bag or between your splint and the bag.  Leave the ice on for 20 minutes, 2-3 times a day.  Activity  Rest your finger as told by your health care provider. Avoid  activities that make the pain worse.  Return to your normal activities as told by your health care provider. Ask your health care provider what activities are safe for you.  Do exercises as told by your health care provider.  Ask your health care provider when it is safe to drive if you have a splint on your hand. General instructions  Take over-the-counter and prescription medicines only as told by your health care provider.  Keep all follow-up visits as told by your health care provider. This is important. Contact a health care provider if:  Your symptoms are not improving with home care. Summary  Trigger finger, also called stenosing tenosynovitis, causes your finger to get stuck in a bent position. This can make it difficult and painful to straighten your finger.  This condition develops when a finger tendon or tendon sheath thickens.  Treatment may include resting your finger, wearing a splint, and taking medicines.  In severe cases, surgery to open the tendon sheath may be needed. This information is not intended to replace advice given to you by your health care provider. Make sure you discuss any questions you have with your health care provider. Document Revised: 02/27/2019 Document Reviewed: 02/27/2019 Elsevier Patient Education  [place].

## 2020-01-30 ENCOUNTER — Encounter: Payer: Self-pay | Admitting: Family Medicine

## 2020-01-30 DIAGNOSIS — M65342 Trigger finger, left ring finger: Secondary | ICD-10-CM | POA: Insufficient documentation

## 2020-01-30 DIAGNOSIS — J309 Allergic rhinitis, unspecified: Secondary | ICD-10-CM | POA: Insufficient documentation

## 2020-01-30 NOTE — Assessment & Plan Note (Signed)
Strong history for trigger finger worsening in geriatric patient with significant history of AMS. Baclofen was appropriate to discontinue, no plan to restart at this time. Good candidate for evaluation with ultrasound and injection. - refer patient to Sports Medicine for evaluation and treatment

## 2020-01-30 NOTE — Progress Notes (Signed)
SUBJECTIVE:   CHIEF COMPLAINT / HPI: hand complaint, decreased taste  L Hand, 4th digit pain: Claudia Walter presents today with new complaint of L ring finger sticking and locking in position. She notes that when she tries to grab something with her left hand her ring finger will seize up and she has to manually unlock the finger with her R hand. These spasms are somewhat painful. She was previously on baclofen, which was stopped after her admission in December to [facility] for AMS and mixing up her medications. She notes that since baclofen was stopped, this has been happening more often. No swelling, tingling, numbness, erythema, or decreased grip/sensation.   Decreased taste: Patient reports that she had her first COVID-19 vaccine last week and since about that time she has had decreased tasted and appreciation of her food. She notes that her son's girlfriend does most of the cooking and she does not flavor the food enough, but she has noticed it with takeout food as well and she hasn't enjoyed her morning biscuit with coffee this week. She denies fever, chills, HA, change in vision, cough, runny nose, sore throat, CP, SOB, n/v/d. Patient is not aware of having allergies before.  PERTINENT  PMH / PSH: DMT2  OBJECTIVE:   BP 122/68   Pulse (!) 105   Ht 5\' 2"  (1.575 m)   SpO2 95%   BMI 26.69 kg/m   Physical Exam Vitals and nursing note reviewed.  Constitutional:      General: She is not in acute distress.    Appearance: Normal appearance. She is normal weight. She is not ill-appearing or toxic-appearing.  HENT:     Head: Normocephalic and atraumatic.     Nose: Congestion and rhinorrhea present.     Mouth/Throat:     Mouth: Mucous membranes are moist.     Pharynx: Oropharynx is clear. No oropharyngeal exudate or posterior oropharyngeal erythema.  Eyes:     Conjunctiva/sclera: Conjunctivae normal.     Pupils: Pupils are equal, round, and reactive to light.  Cardiovascular:     Rate and  Rhythm: Normal rate and regular rhythm.     Heart sounds: No murmur. No friction rub. No gallop.   Pulmonary:     Effort: Pulmonary effort is normal. No respiratory distress.     Breath sounds: Normal breath sounds. No wheezing, rhonchi or rales.  Abdominal:     General: Abdomen is flat. Bowel sounds are normal. There is no distension.     Palpations: Abdomen is soft.     Tenderness: There is no abdominal tenderness.  Musculoskeletal:     Cervical back: Neck supple.     Comments: L Hand: no erythema, edema, tenderness to palpation. ROM of 4th digit limited by patient anxiety over provoking spasm, normal sensation, 5/5 strength.  R Hand: no erythema, edema, tenderness to palpation. ROM WNL, normal sensation, 5/5 strength.  Lymphadenopathy:     Cervical: No cervical adenopathy.  Skin:    General: Skin is warm and dry.  Neurological:     General: No focal deficit present.     Mental Status: She is alert and oriented to person, place, and time. Mental status is at baseline.  Psychiatric:        Mood and Affect: Mood normal.        Behavior: Behavior normal.      ASSESSMENT/PLAN:  Claudia Walter 74 yo woman with trigger finger, controlled DM, and seasonal allergies.  Trigger finger, left ring finger  Strong history for trigger finger worsening in geriatric patient with significant history of AMS. Baclofen was appropriate to discontinue, no plan to restart at this time. Good candidate for evaluation with ultrasound and injection. - refer patient to Sports Medicine for evaluation and treatment  Controlled type 2 diabetes mellitus with complication, with long-term current use of insulin ([place]) Hgb A1c obtained today is 5.9, well below goal. Patient was 6.4% in 09/2019, and 12% in 05/2019. Can reduce metformin from BID to daily.   Allergic rhinitis Patient without any other symptoms but acute decreased appreciation of taste with rhinorrhea after recent COVID-19 vaccination. Pollen acutely  flaring in area right now. Offered COVID testing, patient declined. Recommended she call the office if other symptoms such as cough, shortness of breath, trouble breathing, n/v/d, fever or chills appeared. Most likely this represents allergic rhinitis. -loratadine 10 mg daily -flonase 1 spray each nare daily x 6-8 weeks for best effect   Gladys Damme, MD [place]

## 2020-01-30 NOTE — Assessment & Plan Note (Signed)
Patient without any other symptoms but acute decreased appreciation of taste with rhinorrhea after recent COVID-19 vaccination. Pollen acutely flaring in area right now. Offered COVID testing, patient declined. Recommended she call the office if other symptoms such as cough, shortness of breath, trouble breathing, n/v/d, fever or chills appeared. Most likely this represents allergic rhinitis. -loratadine 10 mg daily -flonase 1 spray each nare daily x 6-8 weeks for best effect

## 2020-01-30 NOTE — Assessment & Plan Note (Signed)
Hgb A1c obtained today is 5.9, well below goal. Patient was 6.4% in 09/2019, and 12% in 05/2019. Can reduce metformin from BID to daily.

## 2020-01-31 ENCOUNTER — Telehealth: Payer: Self-pay | Admitting: Family Medicine

## 2020-01-31 NOTE — Telephone Encounter (Signed)
Hgb A1c decreased from 12.3% in August 2020, to 6.4% in December 2020, to 5.9% this week. Patient is taking metformin only and can decrease by 1/2 to only take 1000 mg qAM. Will recheck A1c in 3 months. 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  Gladys Damme, MD [facility], PGY-1

## 2020-02-01 ENCOUNTER — Telehealth: Payer: Self-pay

## 2020-02-01 DIAGNOSIS — G35 Multiple sclerosis: Secondary | ICD-10-CM

## 2020-02-01 NOTE — Telephone Encounter (Signed)
Debbie, Home health nurse with [facility], calls nurse line to report patient having diffuse abdominal pain on palpation.   Called patient to follow up. Patient reports pain only when abdomen is being palpated. Patient does report feeling bloated and gassy. Offered to schedule patient appointment in office. Patient states that she will follow up with GI doctor.   Patient also needing new order for hospital bed as current bed is no longer working. Patient has had bed for 10 years. Please let "RN Team" know once order has been placed for processing.   Return and ED precautions given.   To PCP  Talbot Grumbling, RN

## 2020-02-05 ENCOUNTER — Telehealth: Payer: Self-pay

## 2020-02-05 NOTE — Telephone Encounter (Signed)
Patient calls nurse line regarding medication management. Patient states that she has decreased metformin from twice daily to once daily. Patient reports that blood sugars have been elevated, with most recent at 210.   Patient is currently asymptomatic.   To PCP  Please advise  Talbot Grumbling, RN

## 2020-02-06 NOTE — Telephone Encounter (Signed)
Spoke with Ms. Claudia Walter. This morning her blood sugar was 129, very reassuring. She has had some elvated blood sugars, but so far only that one time above 200. Discussed that it is ok to have some occasional elevated blood sugars <250. However, if she consistently has blood sugars greater than or equal to 250, to schedule an appointment earlier and we will discuss alternate plan. Her last A1c was 5.5, and increased mortality risk with A1c <7 in her age group. If no further issues, will see her back in 3 months to check A1c.  Gladys Damme, MD [facility], PGY-1

## 2020-02-14 ENCOUNTER — Other Ambulatory Visit: Payer: Self-pay

## 2020-02-14 ENCOUNTER — Encounter: Payer: Self-pay | Admitting: Sports Medicine

## 2020-02-14 ENCOUNTER — Ambulatory Visit (INDEPENDENT_AMBULATORY_CARE_PROVIDER_SITE_OTHER): Payer: Medicare Other | Admitting: Sports Medicine

## 2020-02-14 VITALS — BP 136/55 | Ht 62.5 in

## 2020-02-14 DIAGNOSIS — M6530 Trigger finger, unspecified finger: Secondary | ICD-10-CM | POA: Diagnosis not present

## 2020-02-14 MED ORDER — METHYLPREDNISOLONE ACETATE 40 MG/ML IJ SUSP
20.0000 mg | Freq: Once | INTRAMUSCULAR | Status: AC
  Administered 2020-02-14: 17:00:00 20 mg via INTRA_ARTICULAR

## 2020-02-14 NOTE — Patient Instructions (Signed)
The pain in your finger is caused by a trigger finger.  This is where the flexor tendon that moves your finger gets stuck in a pulley. -The steroid injection given to you today may help with the pain as well as prevent the finger from triggering -I will see back in 1 month if you are still having symptoms.

## 2020-02-14 NOTE — Progress Notes (Addendum)
PCP: Gladys Damme, MD  Subjective:   HPI: Patient is a 74 y.o. female here for left fourth finger pain.  Patient notes the finger gets stuck and she will need to use her other hand to unstick it.  She saw her PCP who told her she had a trigger finger and referred her here.  Patient notes is been going on for several months now.  She notes her fingers currently stuck in flexion she is not able to extend it.  Patient notes the pain will radiate slightly up into her forearm.  She denies numbness or tingling.  She denies any bruising or swelling.  She had no injury or trauma this part of this.  She has not done anything for it other than take over-the-counter anti-inflammatories which has not helped improve the pain.   Review of Systems: See HPI above.  Past Medical History:  Diagnosis Date  . Depression   . Diabetes mellitus without complication ([place])   . GERD (gastroesophageal reflux disease)   . Hyperlipemia   . Multiple sclerosis ([place])   . Osteoporosis     Current Outpatient Medications on File Prior to Visit  Medication Sig Dispense Refill  . buPROPion (WELLBUTRIN) 75 MG tablet TAKE 1 TABLET DAILY 90 tablet 0  . fluticasone (FLONASE) 50 MCG/ACT nasal spray Place 2 sprays into both nostrils daily. 16 g 6  . gabapentin (NEURONTIN) 300 MG capsule Take 1 capsule (300 mg total) by mouth 2 (two) times daily. 60 capsule 0  . glucose blood (FREESTYLE LITE) test strip Use as instructed to test sugar once daily.  Dx code:11.10 100 each 12  . ibuprofen (ADVIL) 600 MG tablet Take 1 tablet (600 mg total) by mouth every morning. 30 tablet 3  . Insulin Pen Needle (PEN NEEDLES) 32G X 4 MM MISC 1 each by Does not apply route daily. 100 each 3  . Lancets (FREESTYLE) lancets Use as instructed 100 each 12  . loratadine (CLARITIN) 10 MG tablet Take 1 tablet (10 mg total) by mouth daily. 30 tablet 11  . metFORMIN (GLUCOPHAGE) 500 MG tablet TAKE 2 TABLETS(1000 MG) BY MOUTH TWICE DAILY WITH A MEAL 360  tablet 1  . Multiple Vitamins-Minerals (MULTIVITAMIN WITH MINERALS) tablet Take 1 tablet by mouth daily.    Marland Kitchen omeprazole (PRILOSEC) 20 MG capsule Take 20 mg by mouth daily before breakfast.    . polyethylene glycol (MIRALAX / GLYCOLAX) packet Take 17 g by mouth daily as needed for moderate constipation.    . polyvinyl alcohol (LIQUIFILM TEARS) 1.4 % ophthalmic solution Place 1 drop into the right eye as needed for dry eyes. 15 mL 0  . pravastatin (PRAVACHOL) 20 MG tablet Take 1 tablet (20 mg total) by mouth every evening. 90 tablet 3  . tolterodine (DETROL LA) 2 MG 24 hr capsule Take 1 capsule (2 mg total) by mouth daily. 30 capsule 30  . vitamin C (ASCORBIC ACID) 500 MG tablet Take 1,000 mg by mouth daily.     . Vitamin D, Cholecalciferol, 50 MCG (2000 UT) CAPS Take 2,000 Units by mouth every morning.     No current facility-administered medications on file prior to visit.    Past Surgical History:  Procedure Laterality Date  . CESAREAN SECTION N/A    X 2  . ESOPHAGOGASTRODUODENOSCOPY (EGD) WITH PROPOFOL N/A 04/12/2018   Procedure: ESOPHAGOGASTRODUODENOSCOPY (EGD) WITH PROPOFOL;  Surgeon: Yetta Flock, MD;  Location: [facility];  Service: Gastroenterology;  Laterality: N/A;  . HIP FRACTURE SURGERY  Right     Allergies  Allergen Reactions  . Sulfa Antibiotics Nausea And Vomiting    Social History   Socioeconomic History  . Marital status: Widowed    Spouse name: Not on file  . Number of children: 2  . Years of education: college  . Highest education level: Bachelor's degree (e.g., BA, AB, BS)  Occupational History  . Occupation: Disabled  Tobacco Use  . Smoking status: Former Smoker    Types: Cigarettes    Quit date: 1978    Years since quitting: 43.3  . Smokeless tobacco: Never Used  Substance and Sexual Activity  . Alcohol use: Yes    Comment: occasional glass of wine  . Drug use: Never  . Sexual activity: Not on file  Other Topics Concern  . Not on file   Social History Narrative   Lives at home with her son, his girlfriend and her children.   Left-handed.   1-2 cups caffeine daily.   Social Determinants of Health   Financial Resource Strain:   . Difficulty of Paying Living Expenses:   Food Insecurity:   . Worried About Charity fundraiser in the Last Year:   . Arboriculturist in the Last Year:   Transportation Needs:   . Film/video editor (Medical):   Marland Kitchen Lack of Transportation (Non-Medical):   Physical Activity:   . Days of Exercise per Week:   . Minutes of Exercise per Session:   Stress:   . Feeling of Stress :   Social Connections:   . Frequency of Communication with Friends and Family:   . Frequency of Social Gatherings with Friends and Family:   . Attends Religious Services:   . Active Member of Clubs or Organizations:   . Attends Archivist Meetings:   Marland Kitchen Marital Status:   Intimate Partner Violence:   . Fear of Current or Ex-Partner:   . Emotionally Abused:   Marland Kitchen Physically Abused:   . Sexually Abused:     Family History  Problem Relation Age of Onset  . Diabetes Mother   . Heart attack Mother   . Colon cancer Father         Objective:  Physical Exam: BP (!) 136/55   Ht 5' 2.5" (1.588 m)   BMI 26.27 kg/m  Gen: NAD, comfortable in exam room Lungs: Breathing comfortably on room air Left fourth finger exam -Finger stuck in flexion.  No other deformity noted. -Tenderness palpation at the A1 pulley system -Normal sensation along the fourth digit left hand.  She is neurovascularly intact   Assessment & Plan:  Patient is a 74 y.o. female here for left fourth trigger finger  1.  Left fourth trigger finger -Consent was obtained for corticosteroid injection of the A1 pulley system of her left fourth digit.  Timeout was performed prior to the procedure.  The skin was cleaned and prepped using alcohol swab.  Following this 20 mg Depo-Medrol and half cc of lidocaine were injected into the pulley system  using sterile technique.  Patient tolerated the injection well, there were no complications. -Patient may continue to take over-the-counter anti-inflammatories as needed for pain  Patient will follow up in 1 month if she continues to have triggering symptoms  Addendum:  I was the preceptor for this visit and available for immediate consultation.  Karlton Lemon MD [facility]

## 2020-02-27 ENCOUNTER — Ambulatory Visit: Payer: Medicare Other | Admitting: Neurology

## 2020-02-28 ENCOUNTER — Ambulatory Visit (INDEPENDENT_AMBULATORY_CARE_PROVIDER_SITE_OTHER): Payer: Medicare Other | Admitting: Neurology

## 2020-02-28 ENCOUNTER — Encounter: Payer: Self-pay | Admitting: Neurology

## 2020-02-28 ENCOUNTER — Other Ambulatory Visit: Payer: Self-pay

## 2020-02-28 VITALS — BP 104/61 | HR 98 | Temp 97.2°F

## 2020-02-28 DIAGNOSIS — G35 Multiple sclerosis: Secondary | ICD-10-CM | POA: Diagnosis not present

## 2020-02-28 DIAGNOSIS — N319 Neuromuscular dysfunction of bladder, unspecified: Secondary | ICD-10-CM

## 2020-02-28 MED ORDER — BUPROPION HCL 75 MG PO TABS: 75.0000 mg | ORAL_TABLET | Freq: Every day | ORAL | 3 refills | Status: DC

## 2020-02-28 MED ORDER — GABAPENTIN 300 MG PO CAPS: 300.0000 mg | ORAL_CAPSULE | Freq: Two times a day (BID) | ORAL | 11 refills | Status: DC

## 2020-02-28 NOTE — Patient Instructions (Signed)
Keep gabapentin 300 mg twice daily Stay off Baclofen for now I will order home health evaluation

## 2020-02-28 NOTE — Progress Notes (Signed)
PATIENT: Claudia Walter DOB: 1945-11-28  REASON FOR VISIT: follow up HISTORY FROM: patient  HISTORY OF PRESENT ILLNESS: Today 02/28/20  HISTORY HISTORY OF PRESENT ILLNESS: Claudia Walter a 74 year old female, seen in refer by primary care physician [name], for evaluation of relapsing remitting multiple sclerosis, initial evaluation was on January 18, 2018,  She was brought in by transportation, in electronic wheelchair, alone at today's clinical visit,  I was able to review old her previous neurological evaluation from [facility] by Dr. [name], most recent note was onJuly 20, 2018,  She is a retired Public relations account executive, was diagnosed with multiple sclerosis since 1993, she presented with thoracic myelopathy, initial symptoms involved pinching around her waist felt like tightening sensation, paresthesia involving all limbs, lumbar puncture was positive, Evoked potential was positive,  She had slowly progressive worsening gait abnormality, remembered in 2000, after her mother passed away, she began to ambulate with a cane, in 2005, after her husband passed away, she began to use her walker, has to quit her job due to worsening gait difficulty, was given a course of IV steroid, did improve her strength temporarily, Betaseron since 2004, she moved to [place] in 2007 due to family stress, was followed by local neurologist [name] at, , [facility], noticed continue progressive gait abnormality, eventually begin to use wheelchair in 2009, but still able to take a few steps with his walker, switched to Copaxone in 2013,   In 2014 she injured her right hip, later also suffered right tibial and fibular fracture, wastreated with a bracefor 8 weeks, was never able to walk again, she also stopped long-term immunomodification therapy since 2014, had a short trial of Ampyra without helping her gait.  Per record, last MRI was in 2018, showed  stable hemisphere lesions,  Laboratory evaluation per record showed: vitamin D was 35, she is JC virus positive with index of 0.41, Tegretol level was 9.4,  She is now living with her son, and his girlfriend and children, she is tearful during today's interview,she has been inher currentwheelchair for 10 years, it has become malfunction, she has more difficulty positioning herself in the wheelchair, also complains of worsening bilateral hip pain due to difficulty positioning, developed decubitus ulcer.  She had suprapubic catheter for 10 years, was recently evaluated by urologist [name] at [facility] urologist, ultrasound of kidney was reported normal  MRI of the brain without contrast in November 2018 from [place] reported mild cerebral atrophy, with proportional ventricular dilatation, scattered supratentorium periventricular, and subcortical white matter hyper intensity, was reported as stable,  Laboratory evaluation in June 2018, normal CBC hemoglobin of 12.8, CMP creatinine of 0.76, normal TSH 3.78, vitamin D of 41, mild elevated alkaline phosphate 158, B12 is 564,  UPDATE April 27 2018:YY She continue with electronic wheelchair today, was dropped in by transportation, she reported intermittent episode of sharp pain starting at left knee, radiating to left shin, lasting for hours, ibuprofen only provide limited help  She also reported that this morning, she was very tired, difficulty to be awakened, similar to the symptoms she had previously for her UTI, UPDATE1/22/2020CMMs.Durfey,74 year old female returns for follow-up with history of multiple sclerosis likely secondary progressive stopped her immunomodulation therapy in 2014. She was diagnosed in 1993. She has a new Clinical research associate. At her last visit she was complaining of intermittent sharp pain at the left knee but that is much better and she is no longer taking tramadol.  She continues to have home health for CNA  services for bathing and her suprapubic catheter is changed by nursing. She did have a decubitus ulcer but that is mostly healed. She was getting physical therapy but has plateaued. She remains on baclofen Wellbutrin and Neurontin from this office. She requires help to transfer from bed to to wheelchair. She lives with her son and his girlfriend. Last MRI of the brain in 2018 was stable.She returns for reevaluation  Update August 30, 2019 SS: She is here today alone.  Since last seen, she was hospitalized in August for urosepsis and elevated glucose.  She is now taking insulin.  She remains on Wellbutrin, gabapentin, and baclofen.  She admits, she often only takes the medications once a day.  She is an Clinical research associate.  She lives with her son and his girlfriend.  Last MRI of the brain was in 2018 and was stable.  She has been off immunomodulating medication since 2014.  She has a suprapubic catheter.  She says sometimes at night she may have stabbing pain to her right ankle when lying on her right side, is relieved with position change.  She has a CNA who comes once a week to help with bathing, a nurse comes once a week for catheter care.  She did have sacral ulcers, but have now healed.  She requires assistance with bathing and ADLs.  She is able to administer her medications.  She wishes to have further home health assistance, thinks she could benefit from physical therapy or rehab.  Update Feb 28, 2020 SS: She was hospitalized in December for altered mental status, thought to have taken too much medication on accident, her gabapentin was decreased to 300 mg twice a day, baclofen was discontinued.  Urine was suspicious for UTI, there was some mention of difficulty for her to manage her home medications, SNF was recommended, the patient refused.  She lives at home with her son and daughter-in-law, here today alone.  She needs a Engineering geologist for her wheelchair via [employer].  She is doing okay on  lower dose of gabapentin with pain in feet, has not noticed much change since off of baclofen.  She no longer has home health, a nurse comes once a month to change her suprapubic catheter, she is nonambulatory, essentially total care. If she has pain at night, she will change her position. She has done ok off baclofen, says she was on since 1994.   REVIEW OF SYSTEMS: Out of a complete 14 system review of symptoms, the patient complains only of the following symptoms, and all other reviewed systems are negative.  N/a  ALLERGIES: Allergies  Allergen Reactions  . Sulfa Antibiotics Nausea And Vomiting    HOME MEDICATIONS: Outpatient Medications Prior to Visit  Medication Sig Dispense Refill  . fluticasone (FLONASE) 50 MCG/ACT nasal spray Place 2 sprays into both nostrils daily. 16 g 6  . glucose blood (FREESTYLE LITE) test strip Use as instructed to test sugar once daily.  Dx code:11.10 100 each 12  . ibuprofen (ADVIL) 600 MG tablet Take 1 tablet (600 mg total) by mouth every morning. 30 tablet 3  . Lancets (FREESTYLE) lancets Use as instructed 100 each 12  . loratadine (CLARITIN) 10 MG tablet Take 1 tablet (10 mg total) by mouth daily. 30 tablet 11  . metFORMIN (GLUCOPHAGE) 500 MG tablet TAKE 2 TABLETS(1000 MG) BY MOUTH TWICE DAILY WITH A MEAL 360 tablet 1  . Multiple Vitamins-Minerals (MULTIVITAMIN WITH MINERALS)  tablet Take 1 tablet by mouth daily.    Marland Kitchen omeprazole (PRILOSEC) 20 MG capsule Take 20 mg by mouth daily before breakfast.    . polyethylene glycol (MIRALAX / GLYCOLAX) packet Take 17 g by mouth daily as needed for moderate constipation.    . polyvinyl alcohol (LIQUIFILM TEARS) 1.4 % ophthalmic solution Place 1 drop into the right eye as needed for dry eyes. 15 mL 0  . pravastatin (PRAVACHOL) 20 MG tablet Take 1 tablet (20 mg total) by mouth every evening. 90 tablet 3  . tolterodine (DETROL LA) 2 MG 24 hr capsule Take 1 capsule (2 mg total) by mouth daily. 30 capsule 30  . vitamin C  (ASCORBIC ACID) 500 MG tablet Take 1,000 mg by mouth daily.     . Vitamin D, Cholecalciferol, 50 MCG (2000 UT) CAPS Take 2,000 Units by mouth every morning.    Marland Kitchen buPROPion (WELLBUTRIN) 75 MG tablet TAKE 1 TABLET DAILY 90 tablet 0  . gabapentin (NEURONTIN) 300 MG capsule Take 1 capsule (300 mg total) by mouth 2 (two) times daily. 60 capsule 0  . Insulin Pen Needle (PEN NEEDLES) 32G X 4 MM MISC 1 each by Does not apply route daily. 100 each 3   No facility-administered medications prior to visit.    PAST MEDICAL HISTORY: Past Medical History:  Diagnosis Date  . Depression   . Diabetes mellitus without complication ([place])   . GERD (gastroesophageal reflux disease)   . Hyperlipemia   . Multiple sclerosis ([place])   . Osteoporosis     PAST SURGICAL HISTORY: Past Surgical History:  Procedure Laterality Date  . CESAREAN SECTION N/A    X 2  . ESOPHAGOGASTRODUODENOSCOPY (EGD) WITH PROPOFOL N/A 04/12/2018   Procedure: ESOPHAGOGASTRODUODENOSCOPY (EGD) WITH PROPOFOL;  Surgeon: Yetta Flock, MD;  Location: [facility];  Service: Gastroenterology;  Laterality: N/A;  . HIP FRACTURE SURGERY Right     FAMILY HISTORY: Family History  Problem Relation Age of Onset  . Diabetes Mother   . Heart attack Mother   . Colon cancer Father     SOCIAL HISTORY: Social History   Socioeconomic History  . Marital status: Widowed    Spouse name: Not on file  . Number of children: 2  . Years of education: college  . Highest education level: Bachelor's degree (e.g., BA, AB, BS)  Occupational History  . Occupation: Disabled  Tobacco Use  . Smoking status: Former Smoker    Types: Cigarettes    Quit date: 1978    Years since quitting: 43.3  . Smokeless tobacco: Never Used  Substance and Sexual Activity  . Alcohol use: Yes    Comment: occasional glass of wine  . Drug use: Never  . Sexual activity: Not on file  Other Topics Concern  . Not on file  Social History Narrative   Lives at home  with her son, his girlfriend and her children.   Left-handed.   1-2 cups caffeine daily.   Social Determinants of Health   Financial Resource Strain:   . Difficulty of Paying Living Expenses:   Food Insecurity:   . Worried About Charity fundraiser in the Last Year:   . Arboriculturist in the Last Year:   Transportation Needs:   . Film/video editor (Medical):   Marland Kitchen Lack of Transportation (Non-Medical):   Physical Activity:   . Days of Exercise per Week:   . Minutes of Exercise per Session:   Stress:   . Feeling  of Stress :   Social Connections:   . Frequency of Communication with Friends and Family:   . Frequency of Social Gatherings with Friends and Family:   . Attends Religious Services:   . Active Member of Clubs or Organizations:   . Attends Archivist Meetings:   Marland Kitchen Marital Status:   Intimate Partner Violence:   . Fear of Current or Ex-Partner:   . Emotionally Abused:   Marland Kitchen Physically Abused:   . Sexually Abused:     PHYSICAL EXAM  Vitals:   02/28/20 1503  BP: 104/61  Pulse: 98  Temp: (!) 97.2 F (36.2 C)   There is no height or weight on file to calculate BMI.  Generalized: Well developed, in no acute distress   Neurological examination  Mentation: Alert oriented to time, place, history taking. Follows all commands speech and language fluent Cranial nerve II-XII: Pupils were equal round reactive to light. Extraocular movements were full, visual field were full on confrontational test. Facial sensation and strength were normal. Head turning and shoulder shrug  were normal and symmetric. Motor: Good strength in upper extremities, with lower extremities, mild stiffness, fairly good with active ROM, able to elevate right leg against gravity, with left this is less, proximal and distal weakness of lower extremities left greater than right Sensory: Sensory testing is intact to soft touch on all 4 extremities. No evidence of extinction is noted.   Coordination: Cerebellar testing reveals good finger-nose-finger bilaterally Gait and station: Is nonambulatory, in wheelchair Reflexes: Deep tendon reflexes are symmetric but depressed bilaterally  DIAGNOSTIC DATA (LABS, IMAGING, TESTING) - I reviewed patient records, labs, notes, testing and imaging myself where available.  Lab Results  Component Value Date   WBC 10.0 10/02/2019   HGB 11.1 (L) 10/02/2019   HCT 36.7 10/02/2019   MCV 87.4 10/02/2019   PLT 391 10/02/2019      Component Value Date/Time   NA 143 10/02/2019 0731   NA 140 06/14/2019 1226   K 4.2 10/02/2019 0731   CL 108 10/02/2019 0731   CO2 24 10/02/2019 0731   GLUCOSE 81 10/02/2019 0731   BUN 18 10/02/2019 0731   BUN 17 06/14/2019 1226   CREATININE 0.99 10/02/2019 0731   CALCIUM 8.7 (L) 10/02/2019 0731   PROT 7.3 09/30/2019 2223   ALBUMIN 3.6 09/30/2019 2223   AST 17 09/30/2019 2223   ALT 14 09/30/2019 2223   ALKPHOS 122 09/30/2019 2223   BILITOT 0.1 (L) 09/30/2019 2223   GFRNONAA 56 (L) 10/02/2019 0731   GFRAA >60 10/02/2019 0731   Lab Results  Component Value Date   CHOL 250 (H) 12/14/2017   HDL 41 12/14/2017   LDLCALC 159 (H) 12/14/2017   TRIG 249 (H) 12/14/2017   CHOLHDL 6.1 (H) 12/14/2017   Lab Results  Component Value Date   HGBA1C 5.9 01/29/2020   No results found for: UXNATFTD32 Lab Results  Component Value Date   TSH 3.807 09/30/2019      ASSESSMENT AND PLAN 74 y.o. year old female  has a past medical history of Depression, Diabetes mellitus without complication ([place]), GERD (gastroesophageal reflux disease), Hyperlipemia, Multiple sclerosis ([place]), and Osteoporosis. here with:  1.  Multiple sclerosis, likely secondary progressive 2.  Neurogenic bladder -Overall, stable, has done okay on lower dose of gabapentin, discontinuation of baclofen, could benefit from [facility] services -She has stopped long-term immunomodulating therapy since 2014 -Last MRI of the brain, cervical, thoracic spine  in November 2018 -Continue gabapentin 300 mg  twice a day -Continue Wellbutrin 75 mg daily -Will stay off baclofen since discontinued following hospitalization in November, really has not seen much change without baclofen -We will again place order for home health, to see if services are available for CNA, social work, she might would consider SNF/AL, she previously lived in [place] before she moved to [place] her the # for [employer] to contact for Games developer for wheelchair -Follow-up in 6 months or sooner if needed  I spent 30 minutes of face-to-face and non-face-to-face time with patient.  This included previsit chart review, lab review, study review, order entry, electronic health record documentation, patient education.  Butler Denmark, AGNP-C, DNP 02/28/2020, 4:16 PM [facility] [address], [place], [place] [address] [phone]

## 2020-03-05 NOTE — Progress Notes (Signed)
I have reviewed and agreed above plan. 

## 2020-03-29 ENCOUNTER — Telehealth: Payer: Self-pay

## 2020-03-29 NOTE — Telephone Encounter (Signed)
Mardene Celeste, home health nurse, calls nurse line requesting verbal orders for social work evaluation. Mardene Celeste reports the patient would like to know what social resources are available to her. I went ahead and gave verbal for this on PCPs behalf.

## 2020-03-31 ENCOUNTER — Other Ambulatory Visit: Payer: Self-pay | Admitting: Neurology

## 2020-04-01 ENCOUNTER — Other Ambulatory Visit: Payer: Self-pay | Admitting: Family Medicine

## 2020-04-15 ENCOUNTER — Other Ambulatory Visit: Payer: Medicare Other

## 2020-04-17 ENCOUNTER — Ambulatory Visit: Payer: Medicare Other | Attending: Internal Medicine

## 2020-04-17 DIAGNOSIS — Z20822 Contact with and (suspected) exposure to covid-19: Secondary | ICD-10-CM

## 2020-04-18 LAB — NOVEL CORONAVIRUS, NAA: SARS-CoV-2, NAA: NOT DETECTED

## 2020-04-18 LAB — SARS-COV-2, NAA 2 DAY TAT

## 2020-04-23 ENCOUNTER — Ambulatory Visit: Payer: Medicare Other | Admitting: Gastroenterology

## 2020-04-26 ENCOUNTER — Other Ambulatory Visit: Payer: Self-pay | Admitting: Neurology

## 2020-04-26 MED ORDER — BUPROPION HCL 75 MG PO TABS: 75.0000 mg | ORAL_TABLET | Freq: Every day | ORAL | 3 refills | Status: DC

## 2020-04-26 NOTE — Progress Notes (Signed)
I returned call from the patient to the answering service stating that she was out of her Wellbutrin and needed a prescription.  I reviewed the patient's chart she was given a 90-day prescription by Butler Denmark on 02/28/2020 to [facility].  The patient states however she did not pick it up and she gets a 90-day supply from [facility].  I reordered the prescription and asked her to go to [facility] and pick it up.  She voiced understanding

## 2020-04-30 ENCOUNTER — Other Ambulatory Visit: Payer: Self-pay | Admitting: Family Medicine

## 2020-05-08 ENCOUNTER — Other Ambulatory Visit: Payer: Self-pay | Admitting: Neurology

## 2020-05-25 ENCOUNTER — Emergency Department (HOSPITAL_COMMUNITY)
Admission: EM | Admit: 2020-05-25 | Discharge: 2020-05-26 | Disposition: A | Discharge status: Home / Self Care | Payer: Medicare Other | Attending: Emergency Medicine | Admitting: Emergency Medicine

## 2020-05-25 ENCOUNTER — Encounter (HOSPITAL_COMMUNITY): Payer: Self-pay | Admitting: Emergency Medicine

## 2020-05-25 ENCOUNTER — Other Ambulatory Visit: Payer: Self-pay

## 2020-05-25 DIAGNOSIS — Y929 Unspecified place or not applicable: Secondary | ICD-10-CM | POA: Diagnosis not present

## 2020-05-25 DIAGNOSIS — S6992XA Unspecified injury of left wrist, hand and finger(s), initial encounter: Secondary | ICD-10-CM | POA: Diagnosis present

## 2020-05-25 DIAGNOSIS — Z5321 Procedure and treatment not carried out due to patient leaving prior to being seen by health care provider: Secondary | ICD-10-CM | POA: Insufficient documentation

## 2020-05-25 DIAGNOSIS — Y939 Activity, unspecified: Secondary | ICD-10-CM | POA: Insufficient documentation

## 2020-05-25 DIAGNOSIS — W274XXA Contact with kitchen utensil, initial encounter: Secondary | ICD-10-CM | POA: Diagnosis not present

## 2020-05-25 DIAGNOSIS — Y999 Unspecified external cause status: Secondary | ICD-10-CM | POA: Insufficient documentation

## 2020-05-25 NOTE — ED Triage Notes (Signed)
Patient accidentally cut her left distal index finger from a slicer last Thursday , no bleeding / superficial skin laceration .

## 2020-05-26 NOTE — ED Notes (Signed)
Pt has left with son and daughter. This tech and other techs assisted pt into car.

## 2020-05-29 ENCOUNTER — Other Ambulatory Visit: Payer: Self-pay

## 2020-05-29 ENCOUNTER — Ambulatory Visit (HOSPITAL_COMMUNITY)
Admission: EM | Admit: 2020-05-29 | Discharge: 2020-05-29 | Disposition: A | Discharge status: Home / Self Care | Payer: Medicare Other

## 2020-05-29 ENCOUNTER — Encounter (HOSPITAL_COMMUNITY): Payer: Self-pay

## 2020-05-29 DIAGNOSIS — Z5189 Encounter for other specified aftercare: Secondary | ICD-10-CM

## 2020-05-29 NOTE — Discharge Instructions (Signed)
The wound is healing well  Follow up with your primary care to discuss updating your tetanus

## 2020-05-29 NOTE — ED Provider Notes (Signed)
[facility]    CSN: [id number] Arrival date & time: 05/29/20  1253      History   Chief Complaint Chief Complaint  Patient presents with  . Laceration    HPI Claudia Walter is a 74 y.o. female.   Patient presents for evaluation of wound to left index finger.  She cut this on a kitchen slicer around 6 days ago.  She reports it was a superficial cut.  Has been healing well.  Denies pain and has not bled in some time.  No fevers.  No redness.     Past Medical History:  Diagnosis Date  . Depression   . Diabetes mellitus without complication ([place])   . GERD (gastroesophageal reflux disease)   . Hyperlipemia   . Multiple sclerosis ([place])   . Osteoporosis     Patient Active Problem List   Diagnosis Date Noted  . Trigger finger, left ring finger 01/30/2020  . Allergic rhinitis 01/30/2020  . Xerostomia 10/10/2019  . AMS (altered mental status) 10/01/2019  . Acute encephalopathy   . Postmenopausal vaginal bleeding 06/20/2019  . Pressure injury of skin 06/09/2019  . Diabetic ketoacidosis without coma associated with type 2 diabetes mellitus ([place])   . Abnormality of gait 11/16/2018  . Gastroesophageal reflux disease   . Hiatal hernia 12/15/2017  . Anemia 12/15/2017  . Multiple sclerosis ([place]) 12/14/2017  . Neurogenic bladder 12/14/2017  . Suprapubic catheter ([place]) 12/14/2017  . Osteoporosis 12/14/2017  . Hyperlipidemia 12/14/2017    Past Surgical History:  Procedure Laterality Date  . CESAREAN SECTION N/A    X 2  . ESOPHAGOGASTRODUODENOSCOPY (EGD) WITH PROPOFOL N/A 04/12/2018   Procedure: ESOPHAGOGASTRODUODENOSCOPY (EGD) WITH PROPOFOL;  Surgeon: Yetta Flock, MD;  [facility];  Service: Gastroenterology;  Laterality: N/A;  . HIP FRACTURE SURGERY Right     OB History   No obstetric history on file.      Home Medications    Prior to Admission medications   Medication Sig Start Date End Date Taking? Authorizing Provider  ibuprofen  (ADVIL) 600 MG tablet TAKE 1 TABLET(600 MG) BY MOUTH EVERY MORNING 04/01/20   Gladys Damme, MD  metFORMIN (GLUCOPHAGE) 500 MG tablet TAKE 2 TABLETS(1000 MG) BY MOUTH TWICE DAILY WITH A MEAL 04/30/20   Gladys Damme, MD  buPROPion [facility]) 75 MG tablet Take 1 tablet (75 mg total) by mouth daily. 04/26/20   Garvin Fila, MD  fluticasone (FLONASE) 50 MCG/ACT nasal spray Place 2 sprays into both nostrils daily. 01/29/20   Gladys Damme, MD  gabapentin (NEURONTIN) 300 MG capsule Take 1 capsule (300 mg total) by mouth 2 (two) times daily. 02/28/20 03/29/20  Suzzanne Cloud, NP  glucose blood (FREESTYLE LITE) test strip Use as instructed to test sugar once daily.  Dx code:11.10 01/23/20   Gladys Damme, MD  Lancets (FREESTYLE) lancets Use as instructed 09/05/19   Gladys Damme, MD  loratadine (CLARITIN) 10 MG tablet Take 1 tablet (10 mg total) by mouth daily. 01/29/20   Gladys Damme, MD  Multiple Vitamins-Minerals (MULTIVITAMIN WITH MINERALS) tablet Take 1 tablet by mouth daily.    [provider]  omeprazole (PRILOSEC) 20 MG capsule Take 20 mg by mouth daily before breakfast.    [provider]  polyethylene glycol (MIRALAX / GLYCOLAX) packet Take 17 g by mouth daily as needed for moderate constipation.    [provider]  polyvinyl alcohol (LIQUIFILM TEARS) 1.4 % ophthalmic solution Place 1 drop into the right eye as needed  for dry eyes. 10/03/19   Gifford Shave, MD  pravastatin (PRAVACHOL) 20 MG tablet Take 1 tablet (20 mg total) by mouth every evening. 01/16/20   Gladys Damme, MD  tolterodine (DETROL LA) 2 MG 24 hr capsule Take 1 capsule (2 mg total) by mouth daily. 10/10/19   Daisy Floro, DO  vitamin C (ASCORBIC ACID) 500 MG tablet Take 1,000 mg by mouth daily.     [provider]  Vitamin D, Cholecalciferol, 50 MCG (2000 UT) CAPS Take 2,000 Units by mouth every morning.    [provider]    Family History Family History  Problem  Relation Age of Onset  . Diabetes Mother   . Heart attack Mother   . Colon cancer Father     Social History Social History   Tobacco Use  . Smoking status: Former Smoker    Types: Cigarettes    Quit date: 1978    Years since quitting: 43.6  . Smokeless tobacco: Never Used  Vaping Use  . Vaping Use: Never used  Substance Use Topics  . Alcohol use: Yes    Comment: occasional glass of wine  . Drug use: Never     Allergies   Sulfa antibiotics   Review of Systems Review of Systems   Physical Exam Triage Vital Signs ED Triage Vitals  Enc Vitals Group     BP 05/29/20 1408 126/62     Pulse Rate 05/29/20 1408 80     Resp 05/29/20 1408 16     Temp 05/29/20 1408 98.3 F (36.8 C)     Temp Source 05/29/20 1408 Oral     SpO2 05/29/20 1408 99 %     Weight --      Height --      Head Circumference --      Peak Flow --      Pain Score 05/29/20 1421 0     Pain Loc --      Pain Edu? --      Excl. in [place]? --    No data found.  Updated Vital Signs BP 126/62 (BP Location: Left Arm)   Pulse 80   Temp 98.3 F (36.8 C) (Oral)   Resp 16   SpO2 99%   Visual Acuity Right Eye Distance:   Left Eye Distance:   Bilateral Distance:    Right Eye Near:   Left Eye Near:    Bilateral Near:     Physical Exam Vitals and nursing note reviewed.  Constitutional:      Appearance: Normal appearance.     Comments: Patient in motorized wheelchair  Skin:    Comments: Well-healing approximately 1 cm laceration wound to the left distal index finger.  Scab in place.  No surrounding erythema or tenderness.  No swelling.  Neurological:     Mental Status: She is alert.      UC Treatments / Results  Labs (all labs ordered are listed, but only abnormal results are displayed) Labs Reviewed - No data to display  EKG   Radiology No results found.  Procedures Procedures (including critical care time)  Medications Ordered in UC Medications - No data to display  Initial  Impression / Assessment and Plan / UC Course  I have reviewed the triage vital signs and the nursing notes.  Pertinent labs & imaging results that were available during my care of the patient were reviewed by me and considered in my medical decision making (see chart for details).     #  Wound check Patient 74 year old presenting for wound check.  Appears well-healing.  No signs of infection.  Given patient's MS history, discussed that she should follow-up with her primary care to discuss tetanus updates.  Patient verbalized understanding agreement plan. Final Clinical Impressions(s) / UC Diagnoses   Final diagnoses:  Visit for wound check     Discharge Instructions     The wound is healing well  Follow up with your primary care to discuss updating your tetanus      ED Prescriptions    None     PDMP not reviewed this encounter.   Purnell Shoemaker, PA-C 05/29/20 1513

## 2020-05-29 NOTE — ED Triage Notes (Signed)
Pt reports she is here as her son asked her to come and evaluate the left index finger.  Pt states 6 days ago she cut the left index finger with a slicer. At this moment pt denies pain, no bleeding.

## 2020-06-04 ENCOUNTER — Other Ambulatory Visit (INDEPENDENT_AMBULATORY_CARE_PROVIDER_SITE_OTHER): Payer: Medicare Other

## 2020-06-04 ENCOUNTER — Ambulatory Visit (INDEPENDENT_AMBULATORY_CARE_PROVIDER_SITE_OTHER): Payer: Medicare Other | Admitting: Gastroenterology

## 2020-06-04 ENCOUNTER — Encounter: Payer: Self-pay | Admitting: Gastroenterology

## 2020-06-04 VITALS — BP 128/68 | HR 92

## 2020-06-04 DIAGNOSIS — R1031 Right lower quadrant pain: Secondary | ICD-10-CM

## 2020-06-04 DIAGNOSIS — Z800 Family history of malignant neoplasm of digestive organs: Secondary | ICD-10-CM | POA: Diagnosis not present

## 2020-06-04 LAB — BASIC METABOLIC PANEL
BUN: 23 mg/dL (ref 6–23)
CO2: 28 mEq/L (ref 19–32)
Calcium: 9.4 mg/dL (ref 8.4–10.5)
Chloride: 101 mEq/L (ref 96–112)
Creatinine, Ser: 1.54 mg/dL — ABNORMAL HIGH (ref 0.40–1.20)
GFR: 39.69 mL/min — ABNORMAL LOW (ref >60.00)
Glucose, Bld: 125 mg/dL — ABNORMAL HIGH (ref 70–99)
Potassium: 3.9 mEq/L (ref 3.5–5.1)
Sodium: 140 mEq/L (ref 135–145)

## 2020-06-04 NOTE — Progress Notes (Signed)
06/04/2020 Claudia Walter [id number] Mar 20, 1946   HISTORY OF PRESENT ILLNESS: This is a 74 year old female who is a patient of Dr. Doyne Keel.  She was last seen here in March 2020 at which time she complained of right-sided abdominal pain.  CT scan was ordered, but she never had that done due to Covid, etc.  She continues to complain of right-sided abdominal pain.  She is here today with the same complaint.  This pain has been ongoing now at this point.  Her last EGD was in June 2019 at which time she had a 3 cm hiatal hernia, benign gastric polyps with a small superficial gastric ulcer, biopsies negative for H. Pylori.  It looks like her last colonoscopy was in 2012 at which point it was noted that she had a long redundant colon which is technically challenging exam in the setting of poor prep.  It was recommended to repeat the exam in 3 to 5 years.  At 1 point time she is scheduled a colonoscopy at the hospital but then canceled.  She requires hospitalization with assistance of bowel prep to complete the exam due to her MS and being wheelchair-bound.  She has been treated for constipation.  She been given Linzess 145 mcg daily, but she says it was too strong for her.  States she is just been using MiraLAX and continues to do so.  Her father was diagnosed with colorectal cancer in his 67s.   Past Medical History:  Diagnosis Date  . Depression   . Diabetes mellitus without complication ([place])   . GERD (gastroesophageal reflux disease)   . Hyperlipemia   . Multiple sclerosis ([place])   . Osteoporosis    Past Surgical History:  Procedure Laterality Date  . CESAREAN SECTION N/A    X 2  . ESOPHAGOGASTRODUODENOSCOPY (EGD) WITH PROPOFOL N/A 04/12/2018   Procedure: ESOPHAGOGASTRODUODENOSCOPY (EGD) WITH PROPOFOL;  Surgeon: Yetta Flock, MD;  Location: [facility];  Service: Gastroenterology;  Laterality: N/A;  . HIP FRACTURE SURGERY Right     reports that she quit smoking about 43  years ago. Her smoking use included cigarettes. She has never used smokeless tobacco. She reports current alcohol use. She reports that she does not use drugs. family history includes Colon cancer in her father; Diabetes in her mother; Heart attack in her mother. Allergies  Allergen Reactions  . Sulfa Antibiotics Nausea And Vomiting      Outpatient Encounter Medications as of 06/04/2020  Medication Sig  . buPROPion (WELLBUTRIN) 75 MG tablet Take 1 tablet (75 mg total) by mouth daily.  . fluticasone (FLONASE) 50 MCG/ACT nasal spray Place 2 sprays into both nostrils daily.  Marland Kitchen gabapentin (NEURONTIN) 300 MG capsule Take 1 capsule (300 mg total) by mouth 2 (two) times daily.  Marland Kitchen glucose blood (FREESTYLE LITE) test strip Use as instructed to test sugar once daily.  Dx code:11.10  . ibuprofen (ADVIL) 600 MG tablet TAKE 1 TABLET(600 MG) BY MOUTH EVERY MORNING  . Lancets (FREESTYLE) lancets Use as instructed  . loratadine (CLARITIN) 10 MG tablet Take 1 tablet (10 mg total) by mouth daily.  . metFORMIN (GLUCOPHAGE) 500 MG tablet TAKE 2 TABLETS(1000 MG) BY MOUTH TWICE DAILY WITH A MEAL  . Multiple Vitamins-Minerals (MULTIVITAMIN WITH MINERALS) tablet Take 1 tablet by mouth daily.  Marland Kitchen MYRBETRIQ 50 MG TB24 tablet Take 50 mg by mouth daily.  Marland Kitchen omeprazole (PRILOSEC) 20 MG capsule Take 20 mg by mouth daily before breakfast.  . polyethylene glycol (MIRALAX /  GLYCOLAX) packet Take 17 g by mouth daily as needed for moderate constipation.  . polyvinyl alcohol (LIQUIFILM TEARS) 1.4 % ophthalmic solution Place 1 drop into the right eye as needed for dry eyes.  . pravastatin (PRAVACHOL) 20 MG tablet Take 1 tablet (20 mg total) by mouth every evening.  . tolterodine (DETROL LA) 2 MG 24 hr capsule Take 1 capsule (2 mg total) by mouth daily.  . vitamin C (ASCORBIC ACID) 500 MG tablet Take 1,000 mg by mouth daily.   . Vitamin D, Cholecalciferol, 50 MCG (2000 UT) CAPS Take 2,000 Units by mouth every morning.   No  facility-administered encounter medications on file as of 06/04/2020.     REVIEW OF SYSTEMS  : All other systems reviewed and negative except where noted in the History of Present Illness.   PHYSICAL EXAM: BP 128/68 (BP Location: Left Arm, Patient Position: Sitting, Cuff Size: Normal)   Pulse 92  General: Pleasant female in no acute distress, in wheelchair Head: Normocephalic and atraumatic Eyes:  Sclerae anicteric, conjunctiva pink. Ears: Normal auditory acuity Lungs: Clear throughout to auscultation; no increased WOB. Heart: Regular rate and rhythm Abdomen: Soft, non-distended.  BS present.  Right sided TTP. Musculoskeletal: Symmetrical with no gross deformities  Skin: No lesions on visible extremities Extremities: No edema  Neurological: Alert oriented x 4, grossly non-focal Psychological:  Alert and cooperative. Normal mood and affect  ASSESSMENT AND PLAN: Right lower quadrant pain - This has been ongoing now.  She never had CT done last year due to Covid, etc.  Will plan for CT scan of the abdomen and pelvis with contrast.  Will check BMP today.   GERD - She thinks that her reflux is ok.  She would just like to continue the omeprazole 20 mg daily for now. No Barrett's on EGD.  Colon cancer screening / FH of colon cancer in her father - Once again she wishes to await CT scan first.  If she proceeds with colonoscopy in the future then she will require hospitalization for assistance with bowel prep and colonoscopy.   CC:  Gladys Damme, MD

## 2020-06-04 NOTE — Patient Instructions (Signed)
If you are age 75 or older, your body mass index should be between 23-30. Your There is no height or weight on file to calculate BMI. If this is out of the aforementioned range listed, please consider follow up with your Primary Care Provider.  If you are age 64 or younger, your body mass index should be between 19-25. Your There is no height or weight on file to calculate BMI. If this is out of the aformentioned range listed, please consider follow up with your Primary Care Provider.   Your provider has requested that you go to the basement level for lab work before leaving today. Press "B" on the elevator. The lab is located at the first door on the left as you exit the elevator.  You have been scheduled for a CT scan of the abdomen and pelvis at [facility], 1st floor Radiology. You are scheduled on Wednesday 06/19/20 at 2:30 pm. You should arrive 15 minutes prior to your appointment time for registration. Please follow the written instructions below on the day of your exam:    1) Do not eat anything after 10:30 am (4 hours prior to your test) .  The solution may taste better if refrigerated, but do NOT add ice or any other liquid to this solution. Shake well before drinking.   Drink 1 bottle of contrast @ 12:30 pm (2 hours prior to your exam)  Drink 1 bottle of contrast @ 1:30 pm (1 hour prior to your exam)   You may take any medications as prescribed with a small amount of water, if necessary. If you take any of the following medications: METFORMIN, GLUCOPHAGE, GLUCOVANCE, AVANDAMET, RIOMET, FORTAMET, ACTOPLUS MET, JANUMET, GLUMETZA or METAGLIP, you MAY be asked to HOLD this medication 48 hours AFTER the exam.   The purpose of you drinking the oral contrast is to aid in the visualization of your intestinal tract. The contrast solution may cause some diarrhea. Depending on your individual set of symptoms, you may also receive an intravenous injection of x-ray contrast/dye. Plan on being at  [place] for 45 minutes or longer, depending on the type of exam you are having performed.   If you have any questions regarding your exam or if you need to reschedule, you may call  Radiology at [phone] between the hours of 8:00 am and 5:00 pm, Monday-Friday.     

## 2020-06-13 ENCOUNTER — Encounter: Payer: Self-pay | Admitting: Gastroenterology

## 2020-06-13 DIAGNOSIS — Z800 Family history of malignant neoplasm of digestive organs: Secondary | ICD-10-CM | POA: Insufficient documentation

## 2020-06-13 DIAGNOSIS — R1031 Right lower quadrant pain: Secondary | ICD-10-CM | POA: Insufficient documentation

## 2020-06-13 NOTE — Progress Notes (Signed)
Agree with assessment and plan as outlined.  

## 2020-06-19 ENCOUNTER — Encounter (HOSPITAL_COMMUNITY): Payer: Self-pay

## 2020-06-19 ENCOUNTER — Ambulatory Visit (HOSPITAL_COMMUNITY)
Admission: RE | Admit: 2020-06-19 | Discharge: 2020-06-19 | Disposition: A | Discharge status: Home / Self Care | Payer: Medicare Other | Source: Ambulatory Visit | Attending: Gastroenterology | Admitting: Gastroenterology

## 2020-06-19 ENCOUNTER — Other Ambulatory Visit: Payer: Self-pay

## 2020-06-19 ENCOUNTER — Telehealth: Payer: Self-pay | Admitting: Family Medicine

## 2020-06-19 DIAGNOSIS — R1031 Right lower quadrant pain: Secondary | ICD-10-CM | POA: Diagnosis not present

## 2020-06-19 DIAGNOSIS — Z800 Family history of malignant neoplasm of digestive organs: Secondary | ICD-10-CM | POA: Insufficient documentation

## 2020-06-19 MED ORDER — SODIUM CHLORIDE (PF) 0.9 % IJ SOLN
INTRAMUSCULAR | Status: AC
  Filled 2020-06-19: qty 50

## 2020-06-19 MED ORDER — IOHEXOL 300 MG/ML  SOLN
100.0000 mL | Freq: Once | INTRAMUSCULAR | Status: AC | PRN
  Administered 2020-06-19: 80 mL via INTRAVENOUS

## 2020-06-19 NOTE — Telephone Encounter (Signed)
Reviewed labs obtained recently by another provider which showed AKI. Patient uses metformin for diabetes, so she needs a BMP recheck as well as an annual physical. Left HIPAA compliant VM for patient to schedule an annual physical at her earliest convenience.  Gladys Damme, MD [facility], PGY-2

## 2020-07-09 ENCOUNTER — Ambulatory Visit (INDEPENDENT_AMBULATORY_CARE_PROVIDER_SITE_OTHER): Payer: Medicare Other | Admitting: Family Medicine

## 2020-07-09 ENCOUNTER — Other Ambulatory Visit: Payer: Self-pay

## 2020-07-09 ENCOUNTER — Encounter: Payer: Self-pay | Admitting: Family Medicine

## 2020-07-09 VITALS — BP 132/60 | HR 97 | Wt 155.0 lb

## 2020-07-09 DIAGNOSIS — Z0001 Encounter for general adult medical examination with abnormal findings: Secondary | ICD-10-CM | POA: Diagnosis not present

## 2020-07-09 DIAGNOSIS — Z23 Encounter for immunization: Secondary | ICD-10-CM | POA: Diagnosis not present

## 2020-07-09 DIAGNOSIS — E1110 Type 2 diabetes mellitus with ketoacidosis without coma: Secondary | ICD-10-CM

## 2020-07-09 DIAGNOSIS — G35 Multiple sclerosis: Secondary | ICD-10-CM

## 2020-07-09 DIAGNOSIS — Z1159 Encounter for screening for other viral diseases: Secondary | ICD-10-CM

## 2020-07-09 DIAGNOSIS — N179 Acute kidney failure, unspecified: Secondary | ICD-10-CM

## 2020-07-09 DIAGNOSIS — Z0000 Encounter for general adult medical examination without abnormal findings: Secondary | ICD-10-CM | POA: Diagnosis not present

## 2020-07-09 DIAGNOSIS — E119 Type 2 diabetes mellitus without complications: Secondary | ICD-10-CM | POA: Diagnosis present

## 2020-07-09 LAB — POCT GLYCOSYLATED HEMOGLOBIN (HGB A1C): HbA1c, POC (controlled diabetic range): 6.2 % (ref 0.0–7.0)

## 2020-07-09 MED ORDER — BACLOFEN 10 MG PO TABS: 10.0000 mg | ORAL_TABLET | Freq: Two times a day (BID) | ORAL | 0 refills | Status: DC

## 2020-07-09 NOTE — Assessment & Plan Note (Signed)
Obtain Hep C screening today, low risk Gave Flu shot and Prevnar 13 today Patient following with GI

## 2020-07-09 NOTE — Assessment & Plan Note (Addendum)
Good compliance, at goal Current Regimen: metformin 500 mg qd CBGs: 90-200 Last A1c: 6.2% today Denies polyuria, polydipsia, hypoglycemia  Last Eye Exam: >1 year ago, referral placed to ophthalmologist Statin: pravastatin 20 mg qd ACE/ARB: not on one - Urine microalbumin collected today - Foot exam completed today: patient has trouble with foot hygiene (nails and skin) due to MS and wheelchair bound, recommend referral to podiatry

## 2020-07-09 NOTE — Patient Instructions (Addendum)
It was a pleasure to see you today!  1. For your diabetes: keep up the good work! Your A1c was 6.2% today. I have referred you to an ophthalmologist to do annual exams for diabetes and your eyes. Please have them send a report to our office.   2. For your feet: I recommend you see a podiatrist who can help you care for them and trim nails. With diabetes, you are more at risk for an infection.  3. We did blood work today to check on your kidneys, if anything is abnormal, I will give you a call. If it has returned to normal, expect to receive a letter in the mail with the results.  4. For your legs: you do have some spasticity (muscle cramping) due to MS. You can use some baclofen to help with this, but I recommend being careful to take it sparingly to avoid ending up in the hospital like in December, 2020. If this does not help, please let me know and I can refer you to a physical medicine and rehabilitation doctor (PM&R), and they have other therapies and treatments that may help such as botox.    5. You received the pneumonia vaccine (prevnar 13) and flu vaccine today. You may have soreness in your arm, fatigue, fever, redness and swelling at the site of injection. You may take tylenol to help with this and it should improve within 24-48 hours. If you have any other problems, please let us know.  Be Well!  Dr. Chauncey Reading

## 2020-07-09 NOTE — Assessment & Plan Note (Signed)
Muscle spasticity apparent on exam today and is painful for patient. Will trial small amount of baclofen, recommend to use ~20 mins before transfers. If no improvement, recommend patient referral to PM&R.

## 2020-07-09 NOTE — Progress Notes (Signed)
SUBJECTIVE:   CHIEF COMPLAINT / HPI:  F/u DM, kidney function  Kidney Function: patient had BMP with GI a few weeks ago that showed an elevation in cr from 0.8-0.99 (baseline) to 1.54. Patient does not remember any problem going on at that time that may relate to the kidneys. She does take ibuprofen regularly for pain related to MS and spastic paralysis and transfers to and from wheelchair (patient is wheel chair bound). Recheck BMP today  DM: patient reports most BG from 90-170, one value of 201 in last 2 weeks. No hypoglycemic episodes. Taking metformin daily. A1c today with foot exam, urine microalbumin. Patient does not have ophthalmologist, will refer her to one.  Immunizations: due for flu and prevnar-13.  Spasticity: patient is wheelchair bound and has spasticity on a daily basis. She was hospitalized in December 2020 for accidentally taking too much baclofen. Baclofen was consequently discontinued at that time. Today, during foot exam, patient has significant spasticity that makes placing her feet on and off wheel chair very painful and difficult. Patient would like some muscle relaxant to help her with transfers to and from the chair and the bed.  PERTINENT  PMH / PSH: DM, MS, catheter  OBJECTIVE:   BP 132/60   Pulse 97   Wt 155 lb (70.3 kg)   SpO2 95%   BMI 28.35 kg/m   Physical Exam Vitals and nursing note reviewed.  Constitutional:      General: She is not in acute distress.    Appearance: Normal appearance. She is normal weight. She is not ill-appearing, toxic-appearing or diaphoretic.  HENT:     Head: Normocephalic and atraumatic.  Eyes:     Conjunctiva/sclera: Conjunctivae normal.  Cardiovascular:     Rate and Rhythm: Normal rate and regular rhythm.     Pulses: Normal pulses.     Heart sounds: Normal heart sounds. No murmur heard.  No friction rub. No gallop.   Pulmonary:     Effort: Pulmonary effort is normal. No respiratory distress.     Breath sounds:  Normal breath sounds. No stridor. No wheezing, rhonchi or rales.  Abdominal:     General: Abdomen is flat.     Palpations: Abdomen is soft.  Musculoskeletal:        General: No swelling, tenderness, deformity or signs of injury.     Right lower leg: No edema.     Left lower leg: No edema.     Comments: Spastic shaking of right leg while performing foot exam  Skin:    General: Skin is warm and dry.  Neurological:     Mental Status: She is alert. Mental status is at baseline.     Comments: In wheel chair with LE spasticity at baseline  Psychiatric:        Mood and Affect: Mood normal.        Behavior: Behavior normal.    ASSESSMENT/PLAN:   Diabetic ketoacidosis without coma associated with type 2 diabetes mellitus (HCC) Good compliance, at goal Current Regimen: metformin 500 mg qd CBGs: 90-200 Last A1c: 6.2% today Denies polyuria, polydipsia, hypoglycemia  Last Eye Exam: >1 year ago, referral placed to ophthalmologist Statin: pravastatin 20 mg qd ACE/ARB: not on one - Urine microalbumin collected today - Foot exam completed today: patient has trouble with foot hygiene (nails and skin) due to MS and wheelchair bound, recommend referral to podiatry    Healthcare maintenance Obtain Hep C screening today, low risk Gave Flu shot and Prevnar  13 today Patient following with GI  Multiple sclerosis ([place]) Muscle spasticity apparent on exam today and is painful for patient. Will trial small amount of baclofen, recommend to use ~20 mins before transfers. If no improvement, recommend patient referral to PM&R.  AKI (acute kidney injury) ([place]) Elevation incidentally noticed on lab work from August that showed increase in cr from baseline (0.8-0.9) to 1.54. Repeat BMP today.     Gladys Damme, MD [facility]

## 2020-07-09 NOTE — Assessment & Plan Note (Signed)
Elevation incidentally noticed on lab work from August that showed increase in cr from baseline (0.8-0.9) to 1.54. Repeat BMP today.

## 2020-07-10 LAB — BASIC METABOLIC PANEL
BUN/Creatinine Ratio: 17 (ref 12–28)
BUN: 27 mg/dL (ref 8–27)
CO2: 25 mmol/L (ref 20–29)
Calcium: 9.3 mg/dL (ref 8.7–10.3)
Chloride: 102 mmol/L (ref 96–106)
Creatinine, Ser: 1.55 mg/dL — ABNORMAL HIGH (ref 0.57–1.00)
GFR calc Af Amer: 37 mL/min/{1.73_m2} — ABNORMAL LOW (ref >59)
GFR calc non Af Amer: 33 mL/min/{1.73_m2} — ABNORMAL LOW (ref >59)
Glucose: 166 mg/dL — ABNORMAL HIGH (ref 65–99)
Potassium: 4.3 mmol/L (ref 3.5–5.2)
Sodium: 142 mmol/L (ref 134–144)

## 2020-07-10 LAB — MICROALBUMIN / CREATININE URINE RATIO
Creatinine, Urine: 35.6 mg/dL
Microalb/Creat Ratio: 65 mg/g creat — ABNORMAL HIGH (ref 0–29)
Microalbumin, Urine: 23.3 ug/mL

## 2020-07-10 LAB — HCV INTERPRETATION

## 2020-07-10 LAB — HCV AB W REFLEX TO QUANT PCR: HCV Ab: <0.1 s/co ratio (ref 0.0–0.9)

## 2020-07-12 ENCOUNTER — Telehealth: Payer: Self-pay | Admitting: Family Medicine

## 2020-07-12 ENCOUNTER — Other Ambulatory Visit: Payer: Self-pay | Admitting: Family Medicine

## 2020-07-12 DIAGNOSIS — R7989 Other specified abnormal findings of blood chemistry: Secondary | ICD-10-CM

## 2020-07-12 DIAGNOSIS — N185 Chronic kidney disease, stage 5: Secondary | ICD-10-CM

## 2020-07-12 NOTE — Telephone Encounter (Signed)
Discussed results of kidney function test with patient, cr is persistently elevated to 1.55 and eGFR ~30s. I have referred her to nephrology. Patient notes that she takes one dose of ibuprofen in the morning to help with her leg and back pain due to MS and being wheel chair bound. I recommend she discontinue this while awaiting further work up. Patient has been using ibuprofen regularly for many years and kidney function was WNL until 06/04/20 when she had a BMP obtained by GI which showed an elevation in scr to 1.54. This prompted the visit to me this week to repeat the test which confirmed persistent elevation. We discussed other medications to alter: gabapentin 300 mg once at night for pain, metformin 500 mg once a day for diabetes (hgb A1c quite low at recent visit, this is appropriate anyway). No dosage adjustment needed for tolterodine or myrbetriq at this eGFR. She is taking Wellbutrin at 75 mg, only recommended to limit daily dose <152m, so no change needed.   CGladys Damme MD [facility], PGY-2

## 2020-07-22 ENCOUNTER — Telehealth: Payer: Self-pay | Admitting: *Deleted

## 2020-07-22 NOTE — Telephone Encounter (Signed)
Patient called to inquire about her referral status. I have updated her on this and let her know that [facility] will be calling her in the next few weeks to make an appointment.  Patient voiced understanding.  She would like to discuss what other medication for pain she can take besides ibuprofen.  Will forward to MD. Johnney Ou

## 2020-07-22 NOTE — Telephone Encounter (Signed)
Patient may use tylenol (up to 4 g per day) and baclofen for muscle spasms.  Gladys Damme, MD [facility], PGY-2

## 2020-07-24 ENCOUNTER — Telehealth: Payer: Self-pay

## 2020-07-24 DIAGNOSIS — Z993 Dependence on wheelchair: Secondary | ICD-10-CM

## 2020-07-24 DIAGNOSIS — G35 Multiple sclerosis: Secondary | ICD-10-CM

## 2020-07-24 NOTE — Telephone Encounter (Signed)
Received phone call from [place], RN with [facility]. RN was in patient's home to change catheter and patient made request to be placed in LTC facility.   Will forward to PCP for next steps.   Talbot Grumbling, RN

## 2020-07-26 NOTE — Telephone Encounter (Signed)
Patient is wheelchair bound due to [place]. Will place CCM consult and forward request to social worker, Casimer Lanius, for assistance on placement.  Gladys Damme, MD [facility], PGY-2

## 2020-07-29 ENCOUNTER — Telehealth: Payer: Self-pay | Admitting: *Deleted

## 2020-07-29 NOTE — Chronic Care Management (AMB) (Signed)
  Chronic Care Management   Note  07/29/2020 Name: Ashleigh Luckow MRN: [id number] DOB: [birth year]  Vicy Medico is a 74 y.o. year old female who is a primary care patient of Gladys Damme, MD. I reached out to [facility] by phone today in response to a referral sent by Ms. Arya Najera's PCP, Gladys Damme, MD.     Ms. Zavadil was given information about Chronic Care Management services today including:  1. CCM service includes personalized support from designated clinical staff supervised by her physician, including individualized plan of care and coordination with other care providers 2. 24/7 contact phone numbers for assistance for urgent and routine care needs. 3. Service will only be billed when office clinical staff spend 20 minutes or more in a month to coordinate care. 4. Only one practitioner may furnish and bill the service in a calendar month. 5. The patient may stop CCM services at any time (effective at the end of the month) by phone call to the office staff. 6. The patient will be responsible for cost sharing (co-pay) of up to 20% of the service fee (after annual deductible is met).  Patient agreed to services and verbal consent obtained.   Follow up plan: Telephone appointment with care management team member scheduled for:07/31/2020  [place]  Direct Dial: [phone]

## 2020-07-31 ENCOUNTER — Ambulatory Visit: Payer: Medicare Other | Admitting: Licensed Clinical Social Worker

## 2020-07-31 DIAGNOSIS — Z719 Counseling, unspecified: Secondary | ICD-10-CM

## 2020-07-31 DIAGNOSIS — Z741 Need for assistance with personal care: Secondary | ICD-10-CM

## 2020-07-31 NOTE — Patient Instructions (Signed)
  Ms. Claudia Walter  it was nice speaking with you. Please call me directly if you have questions about the goals we discussed.  Goals Addressed            This Visit's Progress   . support in the home       - review home care agencies and facility list provided by [facility] Social worker - review options with family  Interventions provided by LCSW:  . Assessment of needs, barriers , agencies contacted, as well as how impacting    . Collaborated with patient's HH RN and Education officer, museum regarding patient's needs  . Other interventions include - care explained - choices provided - emotional support provided - empathic listening provided - questions answered - self-reliance encouraged - verbalization of feelings encouraged    Materials provided:  Ms. Claudia Walter received Care Management services today:  1. Care Management services include personalized support from designated clinical staff supervised by her physician, including individualized plan of care and coordination with other care providers 2. 24/7 contact [phone] for assistance for urgent and routine care needs. 3. Care Management are voluntary services and be declined at any time by calling the office.  Patient verbalizes understanding of instructions provided today.  Follow up plan: SW will follow up with patient by phone over the next week  Maurine Cane, [place]

## 2020-07-31 NOTE — Chronic Care Management (AMB) (Signed)
Care Management   Clinical Social Work initial Note  07/31/2020 Name: Claudia Walter MRN: [id number] DOB: August 18, 1946 Claudia Walter is a 74 y.o. year old female who sees Gladys Damme, MD for primary care. The Care Management team was consulted by PCP to assist the patient with Level of Care Concerns for possible placement.  LCSW reached out to [facility] today by phone to introduce self, assess needs and barriers to care.    Assessment: Patient is bedbound and spends most time alone as family works.   Has HH once a month with regular phone check in calls.  Patient previously worked with [facility] Social worker for placement and home health options but changes her mind. [facility] Social work will no longer work with patient after today. HH LCSW and [place] RN at patient's home. Able to collaborate with them and patient during this encounter. Patient has options and will decide which direction she would like.  Plan: LCSW will F/U with patient in one week.  Patient will call LCSW if needed prior to next outreach  Review of patient status, including review of consultants reports, relevant laboratory and other test results, and collaboration with appropriate care team members and the patient's provider was performed as part of comprehensive patient evaluation and provision of chronic care management services.    Advance Directive Status:  Not addressed in this encounter SDOH (Social Determinants of Health) assessments performed: No needs identified    ; Goals Addressed            This Visit's Progress   . support in the home       - review home care agencies and facility list provided by [facility] Social worker - review options with family  Interventions provided by LCSW:   Assessment of needs, barriers , agencies contacted, as well as how impacting    . Collaborated with patient's HH RN and Education officer, museum regarding patient's needs  . Other interventions include - care explained - choices provided - emotional support  provided - empathic listening provided - questions answered - self-reliance encouraged - verbalization of feelings encouraged      Outpatient Encounter Medications as of 07/31/2020  Medication Sig  . baclofen (LIORESAL) 10 MG tablet Take 1 tablet (10 mg total) by mouth 2 (two) times daily. to help with transfers and muscle spasticity  . buPROPion (WELLBUTRIN) 75 MG tablet Take 1 tablet (75 mg total) by mouth daily.  . fluticasone (FLONASE) 50 MCG/ACT nasal spray Place 2 sprays into both nostrils daily.  Marland Kitchen gabapentin (NEURONTIN) 300 MG capsule Take 1 capsule (300 mg total) by mouth 2 (two) times daily.  Marland Kitchen glucose blood (FREESTYLE LITE) test strip Use as instructed to test sugar once daily.  Dx code:11.10  . Lancets (FREESTYLE) lancets Use as instructed  . loratadine (CLARITIN) 10 MG tablet Take 1 tablet (10 mg total) by mouth daily.  . metFORMIN (GLUCOPHAGE) 500 MG tablet TAKE 2 TABLETS(1000 MG) BY MOUTH TWICE DAILY WITH A MEAL  . Multiple Vitamins-Minerals (MULTIVITAMIN WITH MINERALS) tablet Take 1 tablet by mouth daily.  Marland Kitchen MYRBETRIQ 50 MG TB24 tablet Take 50 mg by mouth daily.  Marland Kitchen omeprazole (PRILOSEC) 20 MG capsule Take 20 mg by mouth daily before breakfast.  . polyethylene glycol (MIRALAX / GLYCOLAX) packet Take 17 g by mouth daily as needed for moderate constipation.  . polyvinyl alcohol (LIQUIFILM TEARS) 1.4 % ophthalmic solution Place 1 drop into the right eye as needed for dry eyes.  . pravastatin (PRAVACHOL) 20  MG tablet Take 1 tablet (20 mg total) by mouth every evening.  . tolterodine (DETROL LA) 2 MG 24 hr capsule Take 1 capsule (2 mg total) by mouth daily.  . vitamin C (ASCORBIC ACID) 500 MG tablet Take 1,000 mg by mouth daily.   . Vitamin D, Cholecalciferol, 50 MCG (2000 UT) CAPS Take 2,000 Units by mouth every morning.   No facility-administered encounter medications on file as of 07/31/2020.       Information about Care Management services was shared with Claudia Walter today  including:  1. Care Management services include personalized support from designated clinical staff supervised by her physician, including individualized plan of care and coordination with other care providers 2. Remind patient of 24/7 contact phone numbers to provider's office for assistance with urgent and routine care needs. 3. Care Management services are voluntary and patient may stop at any time .  Patient agreed to services provided today and verbal consent obtained.   Casimer Lanius, [place] / [place]   [phone] 12:45 PM

## 2020-08-07 ENCOUNTER — Ambulatory Visit: Payer: Medicare Other | Admitting: Licensed Clinical Social Worker

## 2020-08-07 DIAGNOSIS — Z719 Counseling, unspecified: Secondary | ICD-10-CM

## 2020-08-07 DIAGNOSIS — Z7189 Other specified counseling: Secondary | ICD-10-CM

## 2020-08-07 NOTE — Chronic Care Management (AMB) (Signed)
Care Management   Clinical Social Work Follow Up   08/07/2020 Name: Claudia Walter MRN: [id number] DOB: 08-11-1946 Referred by: Gladys Damme, MD  Reason for referral : Care Coordination  Claudia Walter is a 74 y.o. year old female who is a primary care patient of Gladys Damme, MD.  Reason for follow-up: assess for barriers and progress with home support .    Assessment: Patient continues to experience difficulty with meeting her ADL's at home with limited support.  Her son works all day and she is mostly home alone.    Patient has had support from [facility] social worker with exploring options but has been ambivalent with this process.  HH RN does home visit with patient once a month.    Recommendation: Patient may benefit from, and is in agreement to explore all support options. See care plan below for details   Review of patient status, including review of consultants reports, relevant laboratory and other test results, and collaboration with appropriate care team members and the patient's provider was performed as part of comprehensive patient evaluation and provision of care management services.   Plan: Patient would like continued follow-up. LCSW will call patient in 1 week.  Advance Directive Status: not addressed during this encounter.  SDOH (Social Determinants of Health) assessments performed:; No needs identified   Goals Addressed            This Visit's Progress   . support in the home   Not On track    - review home care agencies and call to see which one will meet your needs - review information provided on the [facility] program - we will discuss next week  Interventions provided by LCSW:  . Assessment of needs, barriers , agencies contacted, as well as how impacting    . Provided education information: facility placement options and [facility] program . Other interventions include - care explained - choices provided - emotional support provided - empathic listening provided -  questions answered - self-reliance encouraged - verbalization of feelings encouraged      Outpatient Encounter Medications as of 08/07/2020  Medication Sig  . baclofen (LIORESAL) 10 MG tablet Take 1 tablet (10 mg total) by mouth 2 (two) times daily. to help with transfers and muscle spasticity  . buPROPion (WELLBUTRIN) 75 MG tablet Take 1 tablet (75 mg total) by mouth daily.  . fluticasone (FLONASE) 50 MCG/ACT nasal spray Place 2 sprays into both nostrils daily.  Marland Kitchen gabapentin (NEURONTIN) 300 MG capsule Take 1 capsule (300 mg total) by mouth 2 (two) times daily.  Marland Kitchen glucose blood (FREESTYLE LITE) test strip Use as instructed to test sugar once daily.  Dx code:11.10  . Lancets (FREESTYLE) lancets Use as instructed  . loratadine (CLARITIN) 10 MG tablet Take 1 tablet (10 mg total) by mouth daily.  . metFORMIN (GLUCOPHAGE) 500 MG tablet TAKE 2 TABLETS(1000 MG) BY MOUTH TWICE DAILY WITH A MEAL  . Multiple Vitamins-Minerals (MULTIVITAMIN WITH MINERALS) tablet Take 1 tablet by mouth daily.  Marland Kitchen MYRBETRIQ 50 MG TB24 tablet Take 50 mg by mouth daily.  Marland Kitchen omeprazole (PRILOSEC) 20 MG capsule Take 20 mg by mouth daily before breakfast.  . polyethylene glycol (MIRALAX / GLYCOLAX) packet Take 17 g by mouth daily as needed for moderate constipation.  . polyvinyl alcohol (LIQUIFILM TEARS) 1.4 % ophthalmic solution Place 1 drop into the right eye as needed for dry eyes.  . pravastatin (PRAVACHOL) 20 MG tablet Take 1 tablet (20 mg total) by mouth every  evening.  . tolterodine (DETROL LA) 2 MG 24 hr capsule Take 1 capsule (2 mg total) by mouth daily.  . vitamin C (ASCORBIC ACID) 500 MG tablet Take 1,000 mg by mouth daily.   . Vitamin D, Cholecalciferol, 50 MCG (2000 UT) CAPS Take 2,000 Units by mouth every morning.   No facility-administered encounter medications on file as of 08/07/2020.   Casimer Lanius, [place] / [place]    [phone] 4:21 PM

## 2020-08-10 ENCOUNTER — Other Ambulatory Visit: Payer: Self-pay | Admitting: Family Medicine

## 2020-08-10 DIAGNOSIS — G35 Multiple sclerosis: Secondary | ICD-10-CM

## 2020-08-14 ENCOUNTER — Ambulatory Visit: Payer: Medicare Other | Admitting: Licensed Clinical Social Worker

## 2020-08-14 DIAGNOSIS — Z7189 Other specified counseling: Secondary | ICD-10-CM

## 2020-08-14 NOTE — Patient Instructions (Signed)
  Claudia Walter  it was nice speaking with you. Please call me directly [phone] if you have questions about the goals we discussed. Goals Addressed            This Visit's Progress   . Advance Directive       . Talk to your son about the health care agent . Think about who you would like to select    . support in the home       - continue to review home care agencies and call to see which one will meet your needs - review information provided on the [facility] program      Claudia Walter received Care Management services today:  1. Care Management services include personalized support from designated clinical staff supervised by her physician, including individualized plan of care and coordination with other care providers 2. 24/7 contact [phone] for assistance for urgent and routine care needs. 3. Care Management are voluntary services and be declined at any time by calling the office.  Patient verbalizes understanding of instructions provided today.  Follow up plan: SW will follow up with patient by phone over the next 1 week  Maurine Cane, LCSW

## 2020-08-14 NOTE — Chronic Care Management (AMB) (Signed)
Care Management   Clinical Social Work Follow Up   08/14/2020 Name: Claudia Walter MRN: [id number] DOB: 08/19/1946 Referred by: Gladys Damme, MD  Reason for referral : Care Coordination  Claudia Walter is a 74 y.o. year old female who is a primary care patient of Gladys Damme, MD.  Reason for follow-up: assess for barriers and progress with getting additional support in the home .   Assessment: Patient continues to experience minimal support in the home with ADL's due to family working.   Patient is not making progress towards goal of locating a home health agency due to cost.   Current Barriers:   . Financial constraints related to fixed income . Limited social support . Inability to perform ADL's independently . Inability to perform IADL's independently  Recommendation: Patient may benefit from, and is in agreement to allow LCSW make a referral to the [facility] program.  Plan: Patient would like continued follow-up. LCSW will f/u with patient in 1 week  Clinical Social Work Goal(s): Over the next  60 days, patient will work with LCSW to explore community options to assist with ADL's and additional support in her home Interventions provided by LCSW:  . Assessment of needs, barriers , agencies contacted,    . Provided patient with information about [facility] program  . Referral placed to the [facility] program . Education on advance directives  Motivational Interviewing;Solution-Focused Strategies ;Emotional/Supportive Counseling     Advance Directive Status: N See Care Plan for related entries. ;  SDOH (Social Determinants of Health) assessments performed:; No new needs identified   Goals Addressed            This Visit's Progress   . Advance Directive       . Talk to your son about the health care agent . Think about who you would like to select    . support in the home       - continue to review home care agencies and call to see which one will meet your needs - review information  provided on the [facility] program      Outpatient Encounter Medications as of 08/14/2020  Medication Sig  . baclofen (LIORESAL) 10 MG tablet TAKE 1 TABLET BY MOUTH TWICE DAILY TO HELP WITH TRANSFERS AND MUSCLE SPASTICITY  . buPROPion (WELLBUTRIN) 75 MG tablet Take 1 tablet (75 mg total) by mouth daily.  . fluticasone (FLONASE) 50 MCG/ACT nasal spray Place 2 sprays into both nostrils daily.  Marland Kitchen gabapentin (NEURONTIN) 300 MG capsule Take 1 capsule (300 mg total) by mouth 2 (two) times daily.  Marland Kitchen glucose blood (FREESTYLE LITE) test strip Use as instructed to test sugar once daily.  Dx code:11.10  . Lancets (FREESTYLE) lancets Use as instructed  . loratadine (CLARITIN) 10 MG tablet Take 1 tablet (10 mg total) by mouth daily.  . metFORMIN (GLUCOPHAGE) 500 MG tablet TAKE 2 TABLETS(1000 MG) BY MOUTH TWICE DAILY WITH A MEAL  . Multiple Vitamins-Minerals (MULTIVITAMIN WITH MINERALS) tablet Take 1 tablet by mouth daily.  Marland Kitchen MYRBETRIQ 50 MG TB24 tablet Take 50 mg by mouth daily.  Marland Kitchen omeprazole (PRILOSEC) 20 MG capsule Take 20 mg by mouth daily before breakfast.  . polyethylene glycol (MIRALAX / GLYCOLAX) packet Take 17 g by mouth daily as needed for moderate constipation.  . polyvinyl alcohol (LIQUIFILM TEARS) 1.4 % ophthalmic solution Place 1 drop into the right eye as needed for dry eyes.  . pravastatin (PRAVACHOL) 20 MG tablet Take 1 tablet (20 mg total) by mouth  every evening.  . tolterodine (DETROL LA) 2 MG 24 hr capsule Take 1 capsule (2 mg total) by mouth daily.  . vitamin C (ASCORBIC ACID) 500 MG tablet Take 1,000 mg by mouth daily.   . Vitamin D, Cholecalciferol, 50 MCG (2000 UT) CAPS Take 2,000 Units by mouth every morning.   No facility-administered encounter medications on file as of 08/14/2020.   Review of patient status, including review of consultants reports, relevant laboratory and other test results, and collaboration with appropriate care team members and the patient's provider was performed  as part of comprehensive patient evaluation and provision of care management services.   Casimer Lanius, [place] / [place]   [phone] 11:14 AM

## 2020-08-20 ENCOUNTER — Telehealth: Payer: Self-pay

## 2020-08-20 NOTE — Telephone Encounter (Signed)
Referral refaxed to [facility].  They had not received the fax originally sent in September.  Udell Blasingame,CMA

## 2020-08-20 NOTE — Telephone Encounter (Signed)
Will send existing referral and notes to [facility].  Farren Nelles,CMA

## 2020-08-20 NOTE — Telephone Encounter (Signed)
Patient returns call to nurse line stating that she just got off the phone with [facility] and they have not received referral. They are requesting that we re fax to their office.   Talbot Grumbling, RN

## 2020-08-20 NOTE — Telephone Encounter (Signed)
Patient calls nurse line regarding referral to opthalmologist. Patient states that she was contacted by the retina and diabetes office and was told she would need a referral from a general ophthalmology office.   Will route to PCP and Jazmin   Talbot Grumbling, RN

## 2020-08-21 ENCOUNTER — Ambulatory Visit: Payer: Medicare Other | Admitting: Licensed Clinical Social Worker

## 2020-08-21 NOTE — Chronic Care Management (AMB) (Signed)
Care Management   Clinical Social Work Follow Up   08/21/2020 Name: Claudia Walter MRN: [id number] DOB: Sep 14, 1946 Referred by: Gladys Damme, MD  Reason for referral : Care Coordination  Claudia Walter is a 74 y.o. year old female who is a primary care patient of Gladys Damme, MD.  Reason for follow-up: assess for barriers and progress with care plan goals .   Assessment: Patient continues to experience difficulty with getting additional support in her home.  She keeps missing call from [facility] program. States she would like to put all other home support options on hold until she is able to speak with [facility] .   SDOH (Social Determinants of Health) assessments performed:; No new needs identified Advance Directive Status: N See Care Plan for related entries. Plan: Patient would like continued follow-up. LCSW will f/u with patient after she speaks with [facility]   Patient Care Plan: support in home  Problem Identified: Barriers to Treatment   Goal: Barriers to Treatment Identified   Start Date: 08/14/2020  Expected End Date: 09/13/2020  This Visit's Progress: Not on track  Priority: High  Barriers to care:  unable to afford private pay for a home health aide Interventions provided by LCSW:  . Assessment of needs, barriers , agencies contacted, as well as how impacting    . Collaborated with [facility]  (community agency) re: appointment for patient . Solution-Focused Strategies; . community resource information provided . problem-solving facilitated . resource information provided . support and encouragement provided . willingness to receive help encouraged  Self Care Activities:   . Return phone call from [facility]   Task: Alleviate Barriers to Mental Health Treatment   Care Management Activities:   - community resource information provided - problem-solving facilitated - resource information provided - support and encouragement provided - willingness to receive help encouraged     Patient Care  Plan: Advance Directive  Problem Identified: Long-Term Care Planning   Goal: Effective Long-Term Care Planning   Start Date: 08/14/2020  Expected End Date: 09/20/2020  This Visit's Progress: Not on track  Priority: Medium  Current Barriers:   . Patient does not have an advance directive . Acknowledges deficits with education and support in order to meet this need Clinical Social Work Goal(s):  . review Forensic scientist packet with son . have advance directive notarized and provide a copy to provider office Interventions provided by LCSW: . Assessed understanding of Advance Directives . A voluntary discussion about advanced care planning including importance of advanced directives, healthcare proxy and living will was discussed with the patient.    Task: Facilitate Activities of Long-Term Care Planning   Note:   Care Management Activities:   - advance care planning facilitated     Outpatient Encounter Medications as of 08/21/2020  Medication Sig  . baclofen (LIORESAL) 10 MG tablet TAKE 1 TABLET BY MOUTH TWICE DAILY TO HELP WITH TRANSFERS AND MUSCLE SPASTICITY  . buPROPion (WELLBUTRIN) 75 MG tablet Take 1 tablet (75 mg total) by mouth daily.  . fluticasone (FLONASE) 50 MCG/ACT nasal spray Place 2 sprays into both nostrils daily.  Marland Kitchen gabapentin (NEURONTIN) 300 MG capsule Take 1 capsule (300 mg total) by mouth 2 (two) times daily.  Marland Kitchen glucose blood (FREESTYLE LITE) test strip Use as instructed to test sugar once daily.  Dx code:11.10  . Lancets (FREESTYLE) lancets Use as instructed  . loratadine (CLARITIN) 10 MG tablet Take 1 tablet (10 mg total) by mouth daily.  . metFORMIN (GLUCOPHAGE) 500 MG tablet TAKE  2 TABLETS(1000 MG) BY MOUTH TWICE DAILY WITH A MEAL  . Multiple Vitamins-Minerals (MULTIVITAMIN WITH MINERALS) tablet Take 1 tablet by mouth daily.  Marland Kitchen MYRBETRIQ 50 MG TB24 tablet Take 50 mg by mouth daily.  Marland Kitchen omeprazole (PRILOSEC) 20 MG capsule Take 20 mg by mouth daily before  breakfast.  . polyethylene glycol (MIRALAX / GLYCOLAX) packet Take 17 g by mouth daily as needed for moderate constipation.  . polyvinyl alcohol (LIQUIFILM TEARS) 1.4 % ophthalmic solution Place 1 drop into the right eye as needed for dry eyes.  . pravastatin (PRAVACHOL) 20 MG tablet Take 1 tablet (20 mg total) by mouth every evening.  . tolterodine (DETROL LA) 2 MG 24 hr capsule Take 1 capsule (2 mg total) by mouth daily.  . vitamin C (ASCORBIC ACID) 500 MG tablet Take 1,000 mg by mouth daily.   . Vitamin D, Cholecalciferol, 50 MCG (2000 UT) CAPS Take 2,000 Units by mouth every morning.   No facility-administered encounter medications on file as of 08/21/2020.   Review of patient status, including review of consultants reports, relevant laboratory and other test results, and collaboration with appropriate care team members and the patient's provider was performed as part of comprehensive patient evaluation and provision of care management services.   Casimer Lanius, [place] / [place]   [phone] 4:09 PM

## 2020-08-21 NOTE — Patient Instructions (Signed)
  [name]  it was nice speaking with you. Please call me directly [phone] if you have questions about the goals we discussed. Goals Addressed            This Visit's Progress   . Advance Directive   Not on track    . Talk to your son about the health care agent . Think about who you would like to select    . support in the home   Not on track    - continue to review home care agencies and call to see which one will meet your needs - review information provided on the [facility]ACE program - Return phone calls for [facility] appointments     Ms. Byer received Care Management services today:  1. Care Management services include personalized support from designated clinical staff supervised by her physician, including individualized plan of care and coordination with other care providers 2. 24/7 contact [phone] for assistance for urgent and routine care needs. 3. Care Management are voluntary services and be declined at any time by calling the office.  Patient verbalizes understanding of instructions provided today.  Follow up plan: SW will follow up with patient by phone over the next 14 days  Maurine Cane, LCSW

## 2020-09-03 ENCOUNTER — Ambulatory Visit: Payer: Medicare Other | Admitting: Neurology

## 2020-09-03 ENCOUNTER — Ambulatory Visit: Payer: Medicare Other | Admitting: Licensed Clinical Social Worker

## 2020-09-03 DIAGNOSIS — Z741 Need for assistance with personal care: Secondary | ICD-10-CM

## 2020-09-03 DIAGNOSIS — Z7189 Other specified counseling: Secondary | ICD-10-CM

## 2020-09-03 NOTE — Patient Instructions (Signed)
  Claudia Walter  it was nice speaking with you. Please call me directly [phone] if you have questions about the goals we discussed. Goals Addressed            This Visit's Progress   . Advance Directive   On track    Patient Goals/Self-Care Activities: Over the next 30 days . review and complete Advance Directive packet,  . have advance directive notarized and provide a copy to provider office    . Support in the home with [facility]   On track    Self Care Activities:   . Return phones call from [facility]      Claudia Walter received Care Management services today:  1. Care Management services include personalized support from designated clinical staff supervised by her physician, including individualized plan of care and coordination with other care providers 2. 24/7 contact [phone] for assistance for urgent and routine care needs. 3. Care Management are voluntary services and be declined at any time by calling the office.  Patient verbalizes understanding of instructions provided today.  Follow up plan: SW will follow up with patient by phone over the next 30 days or sooner if needed  Maurine Cane, LCSW

## 2020-09-03 NOTE — Chronic Care Management (AMB) (Signed)
Care Management   Clinical Social Work Follow Up   09/03/2020 Name: Claudia Walter MRN: [id number] DOB: 1946/04/27 Referred by: Gladys Damme, MD  Reason for referral : Care Coordination (f/u)  Claudia Walter is a 74 y.o. year old female who is a primary care patient of Gladys Damme, MD.  Reason for follow-up: assess for barriers and progress with care plan .    Assessment: Patient continues to experience barriers with starting process for [facility].  This was primary due to reservation of participating in the program, however patient has spoken with [facility] representative and is now ready to move forward.  She is also making progress towards goal for advance directive.  Plan: Patient would like continued follow-up.   Advance Directive Status: N See Care Plan for related entries. SDOH (Social Determinants of Health) assessments performed:No new needs identified   Patient Care Plan: Social Work  Problem Identified: needs support in the home   Goal: particpate in the [facility]ACE program   Start Date: 08/14/2020  Expected End Date: 09/13/2020  This Visit's Progress: On track  Recent Progress: Not on track  Priority: High  Note:   Barriers to care:  unable to afford private pay for a home health aide Interventions provided by LCSW:  . Assessment of needs, barriers ,   . Collaborated with [facility]  re: patient wanting to move forward with program ( e-mail sent to [place] as a reminder.  . Motivational interviewing provided . problem-solving facilitated . support and encouragement provided . willingness to receive help encouraged  Self Care Activities:   . Return phones call from [facility]   Problem Identified: No Advance Directive or Health Care Agent   Goal: Effective Long-Term Care Planning   Start Date: 08/21/2020  Expected End Date: 10/25/2020  This Visit's Progress: On track  Note:   Current Barriers:  Patient does not have an advance directive Clinical Social Work Goal(s): have advance directive  notarized and provide a copy to provider office Interventions provided by LCSW: . Assessed understanding of Advance Directives . A voluntary discussion about advanced care planning including importance of advanced directives, healthcare proxy and living will was discussed with the patient . Patient identified who she would like to service as health care agent; LCSW assisted with completing forms and mailed copy to patient .Patient Goals/Self-Care Activities: Over the next 30 days, patient will: . review and complete Advance Directive packet,  . have advance directive notarized and provide a copy to provider office  Follow Up Plan:  . SW will follow up with patient by phone over the next 30 days . Client will call office if needed     Outpatient Encounter Medications as of 09/03/2020  Medication Sig  . baclofen (LIORESAL) 10 MG tablet TAKE 1 TABLET BY MOUTH TWICE DAILY TO HELP WITH TRANSFERS AND MUSCLE SPASTICITY  . buPROPion (WELLBUTRIN) 75 MG tablet Take 1 tablet (75 mg total) by mouth daily.  . fluticasone (FLONASE) 50 MCG/ACT nasal spray Place 2 sprays into both nostrils daily.  Marland Kitchen gabapentin (NEURONTIN) 300 MG capsule Take 1 capsule (300 mg total) by mouth 2 (two) times daily.  Marland Kitchen glucose blood (FREESTYLE LITE) test strip Use as instructed to test sugar once daily.  Dx code:11.10  . Lancets (FREESTYLE) lancets Use as instructed  . loratadine (CLARITIN) 10 MG tablet Take 1 tablet (10 mg total) by mouth daily.  . metFORMIN (GLUCOPHAGE) 500 MG tablet TAKE 2 TABLETS(1000 MG) BY MOUTH TWICE DAILY WITH A MEAL  . Multiple  Vitamins-Minerals (MULTIVITAMIN WITH MINERALS) tablet Take 1 tablet by mouth daily.  Marland Kitchen MYRBETRIQ 50 MG TB24 tablet Take 50 mg by mouth daily.  Marland Kitchen omeprazole (PRILOSEC) 20 MG capsule Take 20 mg by mouth daily before breakfast.  . polyethylene glycol (MIRALAX / GLYCOLAX) packet Take 17 g by mouth daily as needed for moderate constipation.  . polyvinyl alcohol (LIQUIFILM TEARS)  1.4 % ophthalmic solution Place 1 drop into the right eye as needed for dry eyes.  . pravastatin (PRAVACHOL) 20 MG tablet Take 1 tablet (20 mg total) by mouth every evening.  . tolterodine (DETROL LA) 2 MG 24 hr capsule Take 1 capsule (2 mg total) by mouth daily.  . vitamin C (ASCORBIC ACID) 500 MG tablet Take 1,000 mg by mouth daily.   . Vitamin D, Cholecalciferol, 50 MCG (2000 UT) CAPS Take 2,000 Units by mouth every morning.   No facility-administered encounter medications on file as of 09/03/2020.   Review of patient status, including review of consultants reports, relevant laboratory and other test results, and collaboration with appropriate care team members and the patient's provider was performed as part of comprehensive patient evaluation and provision of care management services.   Casimer Lanius, [place] / [place]   [phone] 12:18 PM

## 2020-09-06 ENCOUNTER — Other Ambulatory Visit: Payer: Self-pay | Admitting: Family Medicine

## 2020-09-27 ENCOUNTER — Ambulatory Visit: Payer: Medicare Other | Admitting: Licensed Clinical Social Worker

## 2020-09-27 DIAGNOSIS — Z741 Need for assistance with personal care: Secondary | ICD-10-CM

## 2020-09-27 NOTE — Patient Instructions (Addendum)
°  Ms. Howes  it was nice speaking with you. Please call me directly [phone] if you have questions about the goals we discussed. Goals Addressed            This Visit's Progress    Support in the home with [facility]   Not on track    Patient Goals/Self-Care Activities: Over the next 14 days  Review options with private pay for nursing  I will contact Jessica with [facility] to call you  Call [facility] to follow up if no one has called in 3 days     Ms. Mormile received Care Management services today:  1. Care Management services include personalized support from designated clinical staff supervised by her physician, including individualized plan of care and coordination with other care providers 2. 24/7 contact [phone] for assistance for urgent and routine care needs. 3. Care Management are voluntary services and be declined at any time by calling the office. Patient verbalizes understanding of instructions provided today.    Follow up plan: SW will follow up with patient by phone over the next 2 weeks  Maurine Cane, LCSW

## 2020-09-27 NOTE — Chronic Care Management (AMB) (Signed)
Care Management   Follow Up Clinical Social Work General Note  09/27/2020 Name: Claudia Walter MRN: [id number] DOB: 1946-03-23  Claudia Walter is enrolled in a Managed Medicaid plan: No. Outreach attempt today was successful.   Claudia Walter is a 74 y.o. year old female who is a primary care patient of Gladys Damme, MD. The Care Management team was consulted to assist the patient with Level of Care Concerns for additional support in her home.  Currently lives with son and daughter in-law.   SDOH (Social Determinants of Health) screening performed today:  Yes. Challenges identified: None.   Advanced Directives Status: N See Care Plan for related entries.;.   Plan: LCSW will continue to collaborate with [facility] program and f/u with patient in 7 to 10 days for ongoing support.  Patient Care Plan: Social Work  Problem Identified: needs support in the home   Goal: particpate in the [facility] program   Start Date: 08/14/2020  Expected End Date: 09/13/2020  This Visit's Progress: Not on track  Recent Progress: On track  Priority: High  Assessment /Barriers:  Patient continue to experience difficulty with meeting ADL's at home due to limited support and needs additional assistance in the home to meet this unmet need She has connected with the [facility] program but has not signed the papers.   Concern during the encounter: monthly fee for [facility] will take most of her money but states she needs the help.  Clinical Goals: Over the next 60 days, patient will continue to explore options for home support.   Clinical Interventions : . Assessed needs, barriers and how progressing . Collaborated with Claudia Walter Intake with [facility]  re: patient wanting to move forward with program . Motivational interviewing provided . problem-solving facilitated . support and encouragement provided . willingness to receive help encouraged  . Emotional/Supportive Counseling;  Patient Goals/Self-Care Activities: Over the next 14 days . Review  options with private pay for nursing . I will contact Claudia Walter with [facility] to call you . Call [facility] to follow up if no one has called in 3 days     Problem Identified: No Advance Directive or Health Care Agent   Goal: Effective Long-Term Care Planning   Start Date: 08/21/2020  Expected End Date: 10/25/2020  This Visit's Progress: On track  Current Barriers:  Patient does not have an advance directive Clinical Social Work Goal(s): have advance directive notarized and provide a copy to provider office Interventions provided by LCSW: . Assessed understanding of Advance Directives . A voluntary discussion about advanced care planning including importance of advanced directives, healthcare proxy and living will was discussed with the patient . Patient identified who she would like to service as health care agent; LCSW assisted with completing forms and mailed copy to patient .Patient Goals/Self-Care Activities: Over the next 30 days, patient will: . review and complete Advance Directive packet,  . have advance directive notarized and provide a copy to provider office     Outpatient Encounter Medications as of 09/27/2020  Medication Sig  . baclofen (LIORESAL) 10 MG tablet TAKE 1 TABLET BY MOUTH TWICE DAILY TO HELP WITH TRANSFERS AND MUSCLE SPASTICITY  . Lancets (FREESTYLE) lancets USE AS DIRECTED TO TEST BLOOD SUGAR  . buPROPion (WELLBUTRIN) 75 MG tablet Take 1 tablet (75 mg total) by mouth daily.  . fluticasone (FLONASE) 50 MCG/ACT nasal spray Place 2 sprays into both nostrils daily.  Marland Kitchen gabapentin (NEURONTIN) 300 MG capsule Take 1 capsule (300 mg total) by mouth 2 (two) times  daily.  . glucose blood (FREESTYLE LITE) test strip Use as instructed to test sugar once daily.  Dx code:11.10  . loratadine (CLARITIN) 10 MG tablet Take 1 tablet (10 mg total) by mouth daily.  . metFORMIN (GLUCOPHAGE) 500 MG tablet TAKE 2 TABLETS(1000 MG) BY MOUTH TWICE DAILY WITH A MEAL  . Multiple Vitamins-Minerals  (MULTIVITAMIN WITH MINERALS) tablet Take 1 tablet by mouth daily.  Marland Kitchen MYRBETRIQ 50 MG TB24 tablet Take 50 mg by mouth daily.  Marland Kitchen omeprazole (PRILOSEC) 20 MG capsule Take 20 mg by mouth daily before breakfast.  . polyethylene glycol (MIRALAX / GLYCOLAX) packet Take 17 g by mouth daily as needed for moderate constipation.  . polyvinyl alcohol (LIQUIFILM TEARS) 1.4 % ophthalmic solution Place 1 drop into the right eye as needed for dry eyes.  . pravastatin (PRAVACHOL) 20 MG tablet Take 1 tablet (20 mg total) by mouth every evening.  . tolterodine (DETROL LA) 2 MG 24 hr capsule Take 1 capsule (2 mg total) by mouth daily.  . vitamin C (ASCORBIC ACID) 500 MG tablet Take 1,000 mg by mouth daily.   . Vitamin D, Cholecalciferol, 50 MCG (2000 UT) CAPS Take 2,000 Units by mouth every morning.   No facility-administered encounter medications on file as of 09/27/2020.   Ms. Vossler was given information about Care Management services today including:  1. Care Management services include personalized support from designated clinical staff supervised by her physician, including individualized plan of care and coordination with other care providers 2. 24/7 contact phone numbers for assistance for urgent and routine care needs. 3. The patient may stop care management services at any time (effective at the end of the month) by phone call to the office staff.  Patient agreed to services and verbal consent obtained.   Casimer Lanius, [place] / [place]   [phone] 3:07 PM

## 2020-10-14 ENCOUNTER — Ambulatory Visit: Payer: Medicare Other | Admitting: Family Medicine

## 2020-10-14 NOTE — Progress Notes (Deleted)
    SUBJECTIVE:   CHIEF COMPLAINT / HPI:   ***  PERTINENT  PMH / PSH: ***  OBJECTIVE:   There were no vitals taken for this visit.  ***  ASSESSMENT/PLAN:   No problem-specific Assessment & Plan notes found for this encounter.     Gladys Damme, MD [place]

## 2020-10-15 ENCOUNTER — Ambulatory Visit: Payer: Medicare Other | Admitting: Licensed Clinical Social Worker

## 2020-10-15 DIAGNOSIS — Z7189 Other specified counseling: Secondary | ICD-10-CM

## 2020-10-15 NOTE — Patient Instructions (Addendum)
   Claudia Walter  it was nice speaking with you. Please call me directly [phone] if you have questions about the goals we discussed. Goals Addressed            This Visit's Progress   . Support in the home   Not on track    Patient Goals/Self-Care Activities:  . You have decided not to participate in the [facility] program . Review options with private pay for nursing or additional support from family      Claudia Walter received Care Management services today:  1. Care Management services include personalized support from designated clinical staff supervised by her physician, including individualized plan of care and coordination with other care providers 2. 24/7 contact [phone] for assistance for urgent and routine care needs. 3. Care Management are voluntary services and be declined at any time by calling the office.  Patient verbalizes understanding of instructions provided today.    Follow up plan: SW will follow up with patient by phone over the next 45 days  Maurine Cane, LCSW

## 2020-10-15 NOTE — Chronic Care Management (AMB) (Signed)
Care Management   Clinical Social Work Follow UpNote  10/15/2020 Name: Claudia Walter MRN: [id number] DOB: Jun 14, 1946 Claudia Walter is a 74 y.o. year old female who sees Gladys Damme, MD for primary care.  Patient is enrolled in a Managed Medicaid plan: No.  LCSW engaged with Eduard Roux today by telephone in response to provider referral for social work case management and/or care coordination services. See care plan below for details during this encounter.  Follow Up Plan:  . SW will follow up with patient by phone over the next 30 to 45 days . Client will call office if needed  [place] for related entries     SDOH (Social Determinants of Health) assessments performed: No needs identified   Patient Care Plan: Social Work  Problem Identified: needs support in the home   Goal: Over the next 60 days, patient will continue to explore options for home support.   Start Date: 08/14/2020  Expected End Date: 11/25/2020  Recent Progress: Not on track  Priority: High  Assessment , progress and barriers:  Patient has decided not to move forward with the [facility] program.  Her monthly fee will be 3,000 per month and she is not able to afford this.  Patient will explore other options for private pay in order to meet her needs. . Patient continue to experience difficulty with meeting ADL's at home due to limited support  . needs additional assistance in the home to meet this unmet need.   Clinical Interventions : . Assessed needs, barriers and how progressing . Collaborated with Janett Billow Intake work with [facility] confirmed monthly cost is 3,000 per [employer] . Solution focus to explore all options for home support . Motivational interviewing provided . problem-solving facilitated . support and encouragement provided . Emotional/Supportive Counseling;  Patient Goals/Self-Care Activities: Over the next 14 days . Review options with private pay for nursing or additional support from  family Follow plan: will f/u with patient in 45  days   Problem Identified: No Advance Directive or Health Care Agent   Goal: Effective Long-Term Care Planning   Start Date: 08/21/2020  Expected End Date: 10/25/2020  This Visit's Progress: Not on track  Recent Progress: On track  Progress and Current Barriers:  Patient has not completed the advance directive.  Unsure if she is able to locate it but will bring to her appointment next week and have it notarized.  . Patient does not have an advance directive Clinical Social Work Goal(s): have advance directive notarized and provide a copy to provider office Interventions provided by LCSW: . Assessed understanding of Advance Directives . A voluntary discussion about advanced care planning including importance of advanced directives, healthcare proxy and living will was discussed with the patient . Patient identified who she would like to service as health care agent; LCSW assisted with completing forms and mailed copy to patient . Will leave a copy with CMA to give to patient if she is unable to locate her copy .Patient Goals/Self-Care Activities: Over the next 30 days, patient will: . review and complete Advance Directive packet,  . have advance directive notarized and provide a copy to provider office Follow Up Plan:  . SW will follow up with patient by phone over the next 30 to 45 days . Client will call office if needed      Outpatient Encounter Medications as of 10/15/2020  Medication Sig  . baclofen (LIORESAL) 10 MG tablet TAKE 1 TABLET BY MOUTH TWICE DAILY  TO HELP WITH TRANSFERS AND MUSCLE SPASTICITY  . buPROPion (WELLBUTRIN) 75 MG tablet Take 1 tablet (75 mg total) by mouth daily.  . fluticasone (FLONASE) 50 MCG/ACT nasal spray Place 2 sprays into both nostrils daily.  Marland Kitchen gabapentin (NEURONTIN) 300 MG capsule Take 1 capsule (300 mg total) by mouth 2 (two) times daily.  Marland Kitchen glucose blood (FREESTYLE LITE) test strip Use as instructed  to test sugar once daily.  Dx code:11.10  . Lancets (FREESTYLE) lancets USE AS DIRECTED TO TEST BLOOD SUGAR  . loratadine (CLARITIN) 10 MG tablet Take 1 tablet (10 mg total) by mouth daily.  . metFORMIN (GLUCOPHAGE) 500 MG tablet TAKE 2 TABLETS(1000 MG) BY MOUTH TWICE DAILY WITH A MEAL  . Multiple Vitamins-Minerals (MULTIVITAMIN WITH MINERALS) tablet Take 1 tablet by mouth daily.  Marland Kitchen MYRBETRIQ 50 MG TB24 tablet Take 50 mg by mouth daily.  Marland Kitchen omeprazole (PRILOSEC) 20 MG capsule Take 20 mg by mouth daily before breakfast.  . polyethylene glycol (MIRALAX / GLYCOLAX) packet Take 17 g by mouth daily as needed for moderate constipation.  . polyvinyl alcohol (LIQUIFILM TEARS) 1.4 % ophthalmic solution Place 1 drop into the right eye as needed for dry eyes.  . pravastatin (PRAVACHOL) 20 MG tablet Take 1 tablet (20 mg total) by mouth every evening.  . tolterodine (DETROL LA) 2 MG 24 hr capsule Take 1 capsule (2 mg total) by mouth daily.  . vitamin C (ASCORBIC ACID) 500 MG tablet Take 1,000 mg by mouth daily.   . Vitamin D, Cholecalciferol, 50 MCG (2000 UT) CAPS Take 2,000 Units by mouth every morning.   No facility-administered encounter medications on file as of 10/15/2020.    Review of patient status, including review of consultants reports, relevant laboratory and other test results, and collaboration with appropriate care team members and the patient's provider was performed as part of comprehensive patient evaluation and provision of chronic care management services.       Information about Care Management services was shared with Ms.  Guyton today including:  1. Care Management services include personalized support from designated clinical staff supervised by her physician, including individualized plan of care and coordination with other care providers 2. Remind patient of 24/7 contact phone numbers to provider's office for assistance with urgent and routine care needs. 3. Care Management services  are voluntary and patient may stop at any time .   Patient agreed to services provided today and verbal consent obtained.     Casimer Lanius, [place] / [place]   [phone] 3:59 PM

## 2020-10-22 NOTE — Progress Notes (Signed)
    SUBJECTIVE:   CHIEF COMPLAINT / HPI: pain, DM  CKD Stage IIIb: patient has seen nephrology, no [facility] notes in media unfortunately. She reports that physician she saw recommended continued monitoring at this stage. Patient on 500 mg of metformin and has been stable.  MS, wheel chair bound, spastic paralysis: patient has spastic paralysis with muscle cramps and neuropathic pain, particularly foot neuropathy and thigh spasms. Due to eGFR 30-45, patient is at max gabapentin use, currently using baclofen 10 mg BID. Patient interested in other modalities for pain control.  DM: patient reports that her BG have ranged from 94-150 at home, no lows, doing well overall. She has not seen an ophthalmologist, reminded her to schedule appt. She previously saw podiatry as she has difficulty taking care of her feet due to wheelchair bound status and her son is inconsistent and not helpful with feet. Diabetic foot exam today.  PERTINENT  PMH / PSH: DMT2, CKD IIIB, MS  OBJECTIVE:   BP 130/60   Pulse 100   Ht $R'5\' 2"'ht$  (1.575 m)   SpO2 96%   BMI 28.35 kg/m   Nursing note and vitals reviewed GEN: AAF resting comfortably in wheelchair, NAD, WNWD HEENT: NCAT. Sclera without injection or icterus. MMM.  Neck: Supple.  Cardiac: Regular rate and rhythm. Normal S1/S2. No murmurs, rubs, or gallops appreciated. 2+ radial pulses. Lungs: Clear bilaterally to ascultation. No increased WOB, no accessory muscle usage. No w/r/r. Neuro: Alert and at baseline Ext: no edema Psych: Pleasant and appropriate  Diabetic Foot Exam - Simple   Simple Foot Form Diabetic Foot exam was performed with the following findings: Yes 10/23/2020  2:53 PM  Visual Inspection See comments: Yes Sensation Testing See comments: Yes Pulse Check Posterior Tibialis and Dorsalis pulse intact bilaterally: Yes Comments No ulcerations, onychomycosis present of all nails, icthyosis of skin present. Patient in touch to monofilament testing at toes,  but interestingly not at heels bilaterally.    ASSESSMENT/PLAN:   Multiple sclerosis (HCC) Will discontinue tx with baclofen and start tizanidine $RemoveBeforeDE'2mg'zQwXjedKdotzSCT$  TID, can increase based on tolerability and eGFR. Will repeat BMP today to ensure GFR >25. Referred patient to PM&R due to spasticity and pain due to MS.  CKD (chronic kidney disease) stage 3, GFR 30-59 ml/min (HCC) Patient has seen nephrology, will continue to monitor, see above for BMP regarding medication.  Type 2 diabetes mellitus ([place]) Good home BG, will recheck A1c in 3 months.     Gladys Damme, MD [place]

## 2020-10-22 NOTE — Patient Instructions (Addendum)
It was a pleasure to see you today!  1. I checked your kidney labs today, if your kidney function is above 25, we can start and increase tizanidine for pain and muscle shaking. I will call you with results, which I hope will be back by tomorrow.   2. To start tizanidine: start taking 2 mg up to 3 times per day as needed for pain. If this is tolerable and you still have pain, we can slowly increase from there every week. Please call and let me know how your pain is, and I can counsel you on how to increase medication.  3. I have referred you to podiatry to assist with diabetic foot care. You can call to make an appointment at [phone].  4. I have also referred you to physical medicine and rehabilitation doctors. This referral should go through in 1-2 weeks, you should receive a call in that time to schedule an appointment. If you do not, let us know at [phone] and we can help facilitate that appointment.  5. Follow up in 3 months  Be Well,  Dr. Chauncey Reading

## 2020-10-23 ENCOUNTER — Ambulatory Visit (INDEPENDENT_AMBULATORY_CARE_PROVIDER_SITE_OTHER): Payer: Medicare Other | Admitting: Family Medicine

## 2020-10-23 ENCOUNTER — Encounter: Payer: Self-pay | Admitting: Family Medicine

## 2020-10-23 ENCOUNTER — Other Ambulatory Visit: Payer: Self-pay

## 2020-10-23 VITALS — BP 130/60 | HR 100 | Ht 62.0 in

## 2020-10-23 DIAGNOSIS — B351 Tinea unguium: Secondary | ICD-10-CM

## 2020-10-23 DIAGNOSIS — I120 Hypertensive chronic kidney disease with stage 5 chronic kidney disease or end stage renal disease: Secondary | ICD-10-CM

## 2020-10-23 DIAGNOSIS — N1832 Chronic kidney disease, stage 3b: Secondary | ICD-10-CM

## 2020-10-23 DIAGNOSIS — E1110 Type 2 diabetes mellitus with ketoacidosis without coma: Secondary | ICD-10-CM | POA: Diagnosis not present

## 2020-10-23 DIAGNOSIS — G35 Multiple sclerosis: Secondary | ICD-10-CM

## 2020-10-23 DIAGNOSIS — Z23 Encounter for immunization: Secondary | ICD-10-CM

## 2020-10-23 DIAGNOSIS — E1169 Type 2 diabetes mellitus with other specified complication: Secondary | ICD-10-CM

## 2020-10-23 DIAGNOSIS — N185 Chronic kidney disease, stage 5: Secondary | ICD-10-CM | POA: Diagnosis not present

## 2020-10-23 DIAGNOSIS — Z993 Dependence on wheelchair: Secondary | ICD-10-CM

## 2020-10-23 MED ORDER — TETANUS-DIPHTH-ACELL PERTUSSIS 5-2.5-18.5 LF-MCG/0.5 IM SUSY: 0.5000 mL | PREFILLED_SYRINGE | Freq: Once | INTRAMUSCULAR | 0 refills | Status: AC

## 2020-10-23 MED ORDER — TIZANIDINE HCL 2 MG PO CAPS: 2.0000 mg | ORAL_CAPSULE | Freq: Three times a day (TID) | ORAL | 1 refills | Status: DC | PRN

## 2020-10-23 NOTE — Assessment & Plan Note (Signed)
Will discontinue tx with baclofen and start tizanidine 55m TID, can increase based on tolerability and eGFR. Will repeat BMP today to ensure GFR >25. Referred patient to PM&R due to spasticity and pain due to MS.

## 2020-10-23 NOTE — Assessment & Plan Note (Signed)
Good home BG, will recheck A1c in 3 months.

## 2020-10-23 NOTE — Assessment & Plan Note (Signed)
Patient has seen nephrology, will continue to monitor, see above for BMP regarding medication.

## 2020-10-24 ENCOUNTER — Telehealth: Payer: Self-pay | Admitting: Family Medicine

## 2020-10-24 LAB — BASIC METABOLIC PANEL
BUN/Creatinine Ratio: 14 (ref 12–28)
BUN: 20 mg/dL (ref 8–27)
CO2: 27 mmol/L (ref 20–29)
Calcium: 9.9 mg/dL (ref 8.7–10.3)
Chloride: 102 mmol/L (ref 96–106)
Creatinine, Ser: 1.4 mg/dL — ABNORMAL HIGH (ref 0.57–1.00)
GFR calc Af Amer: 42 mL/min/{1.73_m2} — ABNORMAL LOW (ref >59)
GFR calc non Af Amer: 37 mL/min/{1.73_m2} — ABNORMAL LOW (ref >59)
Glucose: 97 mg/dL (ref 65–99)
Potassium: 4.3 mmol/L (ref 3.5–5.2)
Sodium: 144 mmol/L (ref 134–144)

## 2020-10-24 NOTE — Telephone Encounter (Signed)
Discussed BMP results with patient, kidney function stable eGFR of 37, she can start tizanidine as previously discussed. Will continue to follow and call next week to check in re: pain.  Gladys Damme, MD [facility], PGY-2

## 2020-11-04 ENCOUNTER — Ambulatory Visit: Payer: Medicare Other | Admitting: Podiatry

## 2020-11-18 ENCOUNTER — Ambulatory Visit (INDEPENDENT_AMBULATORY_CARE_PROVIDER_SITE_OTHER): Payer: Medicare Other | Admitting: Podiatry

## 2020-11-18 ENCOUNTER — Other Ambulatory Visit: Payer: Self-pay

## 2020-11-18 DIAGNOSIS — B351 Tinea unguium: Secondary | ICD-10-CM | POA: Diagnosis not present

## 2020-11-18 DIAGNOSIS — M79675 Pain in left toe(s): Secondary | ICD-10-CM | POA: Diagnosis not present

## 2020-11-18 DIAGNOSIS — M79674 Pain in right toe(s): Secondary | ICD-10-CM

## 2020-11-20 NOTE — Progress Notes (Signed)
Subjective: 75 year old female presents the office today for concerns of her nails becoming thickened discolored she is not able to trim them herself may cause discomfort particular with pressure in shoes.  Denies any redness or drainage or any swelling.  Denies any open lesions. Denies any systemic complaints such as fevers, chills, nausea, vomiting. No acute changes since last appointment, and no other complaints at this time.   Objective: AAO x3, NAD-in wheelchair DP/PT pulses palpable bilaterally, CRT less than 3 seconds Nails are hypertrophic, dystrophic, brittle, discolored, elongated 10. No surrounding redness or drainage. Tenderness nails 1-5 bilaterally. No open lesions or pre-ulcerative lesions are identified today. No pain with calf compression, swelling, warmth, erythema  Assessment: 75 year old female with symptomatic onychomycosis  Plan: -All treatment options discussed with the patient including all alternatives, risks, complications.  -Nails debrided x10 without any complications or bleeding. -Daily foot inspection -Patient encouraged to call the office with any questions, concerns, change in symptoms.   Trula Slade DPM

## 2020-11-21 ENCOUNTER — Other Ambulatory Visit: Payer: Self-pay | Admitting: Family Medicine

## 2020-12-01 IMAGING — DX CHEST - 2 VIEW
2 series · 2 of 2 positions shown · non-contrast
Comparison: June 08, 2019

CLINICAL DATA: Shortness of breath

EXAM:
CHEST - 2 VIEW

[x chest ap]
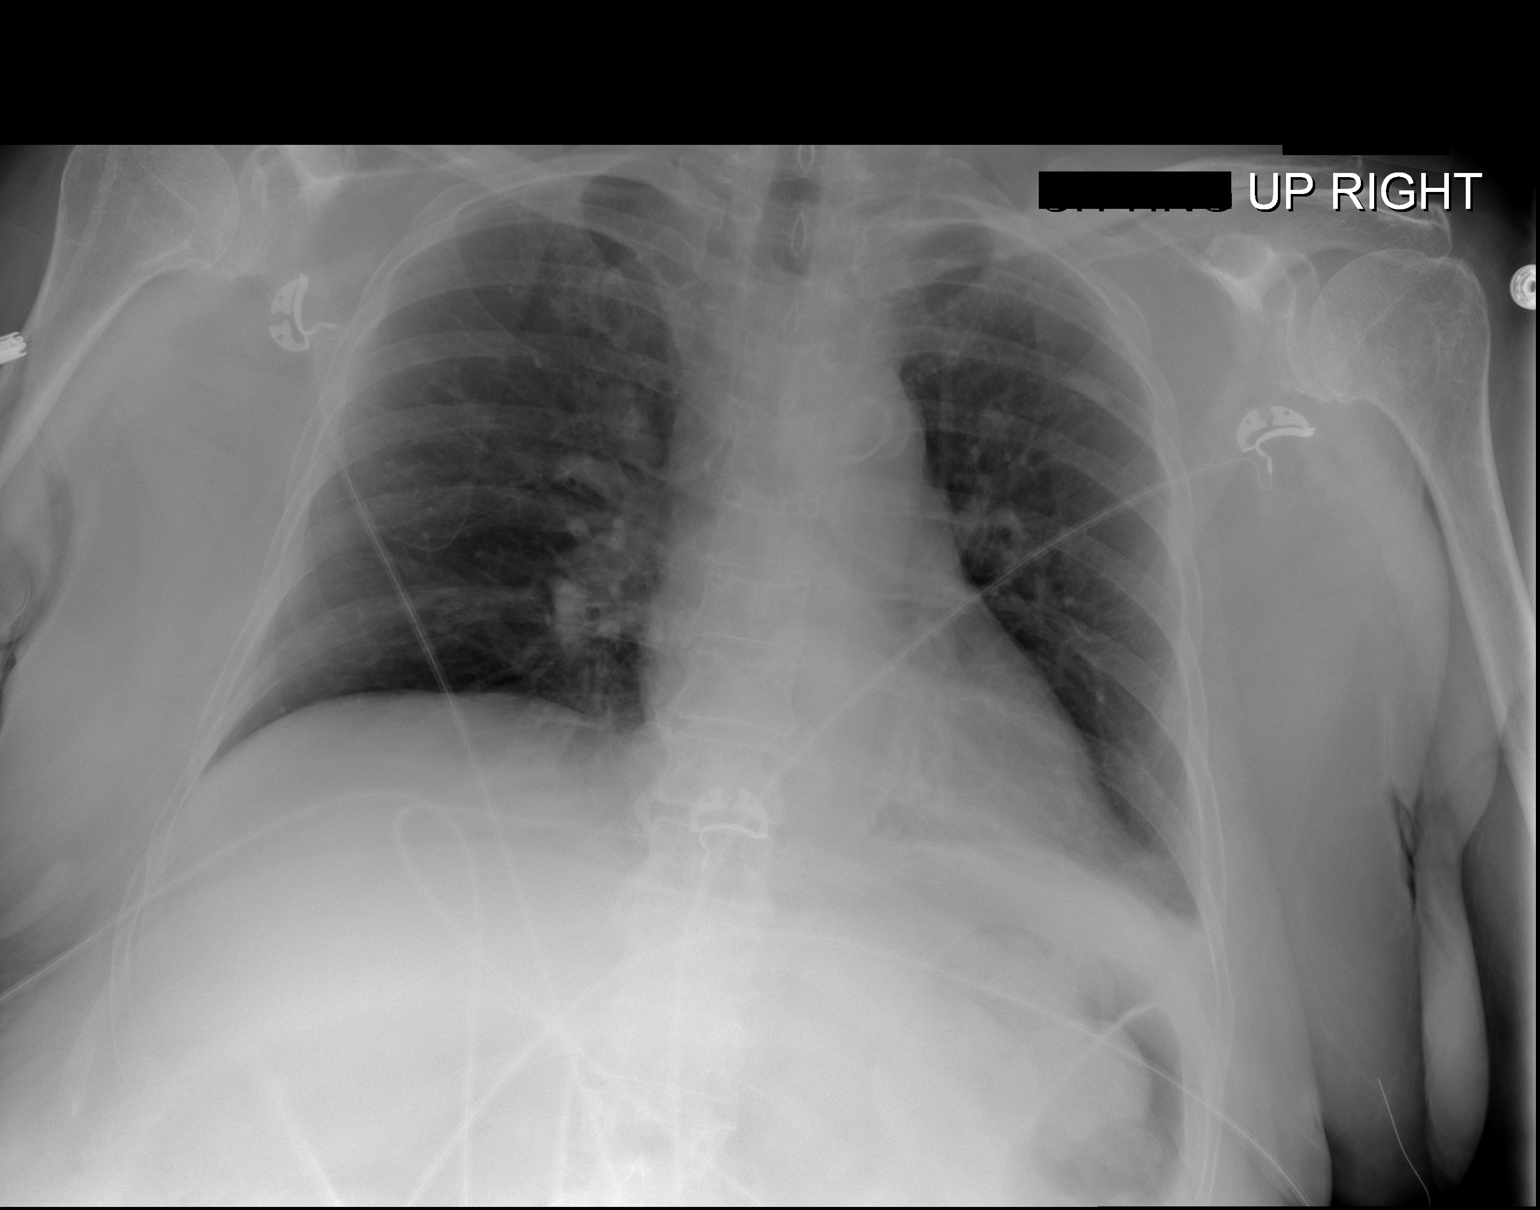

[w chest lat]
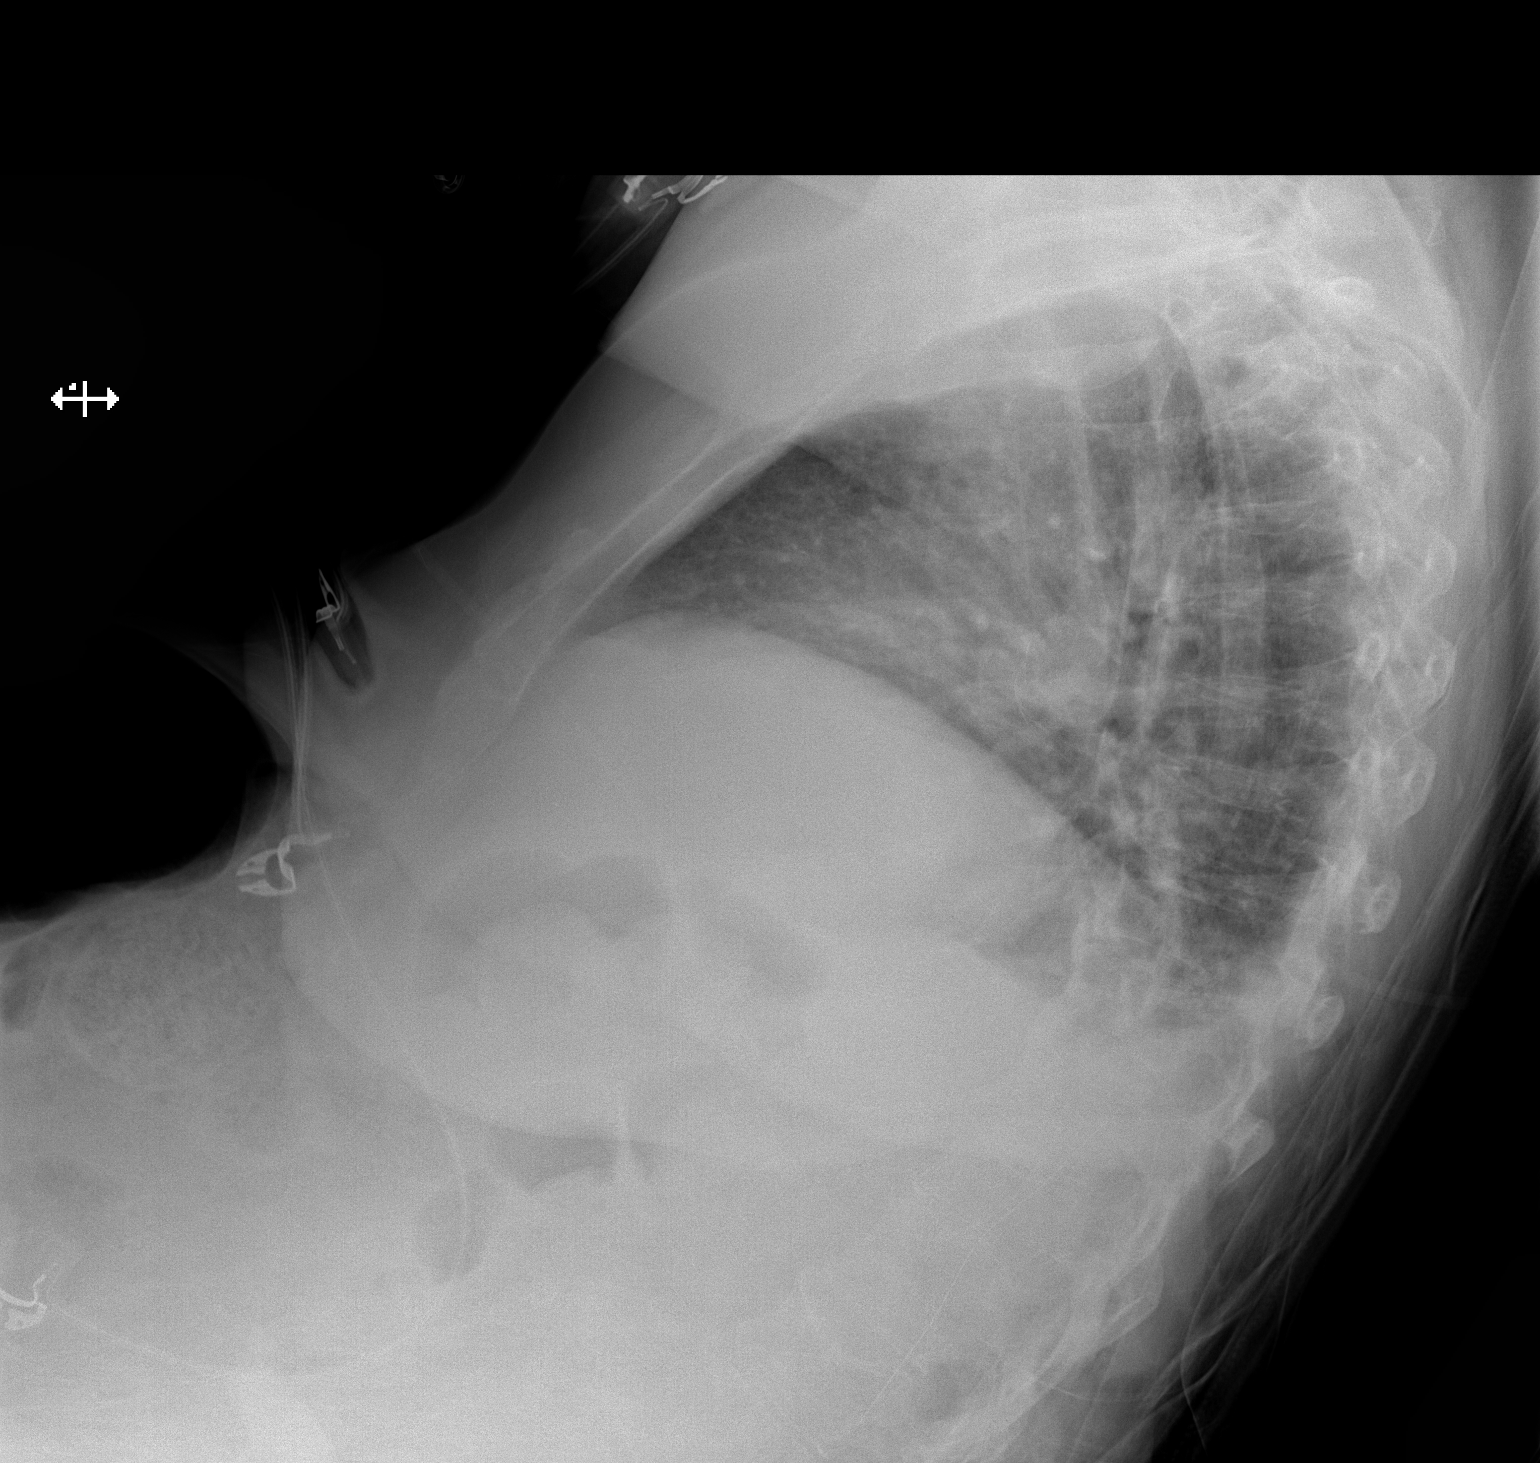

[2 of 2 positions shown; findings below may reference images not displayed]

FINDINGS: There is a minimal left pleural effusion with slight left base
atelectasis. The lungs elsewhere are clear. Heart size and pulmonary
vascularity are normal. No adenopathy. There is aortic
atherosclerosis. No bone lesions.
IMPRESSION: Minimal left pleural effusion with slight left base atelectasis.
Lungs elsewhere clear. Cardiac silhouette within normal limits.
Aortic Atherosclerosis ([id number]).

## 2020-12-03 ENCOUNTER — Ambulatory Visit: Payer: Medicare Other | Admitting: Licensed Clinical Social Worker

## 2020-12-03 DIAGNOSIS — Z741 Need for assistance with personal care: Secondary | ICD-10-CM

## 2020-12-03 NOTE — Chronic Care Management (AMB) (Signed)
Care Management Clinical Social Work Note  12/03/2020 Name: Claudia Walter MRN: [id number] DOB: April 28, 1946  Claudia Walter is a 75 y.o. year old female who is a primary care patient of Claudia Damme, MD.  The Care Management team was consulted for assistance with chronic disease management and coordination needs.  Engaged with patient by telephone for follow up visit in response to provider referral for social work chronic care management and care coordination services  Consent to Services:  Patient agreed to services and consent obtained.   Assessment: Patient reports over all she is doing well. Daughter in-law is helping her daily and getting her up and dressed.  She continues to explore options for additional home support and has barriers with not being able to private. Unable to afford co-pay for PACE and does not qualify for Medicaid.  See Care Plan below for interventions and patient self-care actives. Recent life changes Claudia Walter: patient reports her son and daughter in-law are providing more support for her. Recommendation: Patient may benefit from, and is in agreement to explore community programs to get out of the house this Spring and when COVID decreases.  Follow up Plan: Patient would like continued follow-up.  CCM LCSW will f/u with patient in 90 days. Patient will call office if needed prior to next encounter   Review of patient past medical history, allergies, medications, and health status, including review of relevant consultants reports was performed today as part of a comprehensive evaluation and provision of chronic care management and care coordination services.  SDOH (Social Determinants of Health) assessments and interventions performed:    Advanced Directives Status: Not addressed in this encounter.  Care Plan  Allergies  Allergen Reactions  . Sulfa Antibiotics Nausea And Vomiting    Outpatient Encounter Medications as of 12/03/2020  Medication Sig  . buPROPion  (WELLBUTRIN) 75 MG tablet Take 1 tablet (75 mg total) by mouth daily.  . fluticasone (FLONASE) 50 MCG/ACT nasal spray Place 2 sprays into both nostrils daily.  Marland Kitchen gabapentin (NEURONTIN) 300 MG capsule Take 1 capsule (300 mg total) by mouth 2 (two) times daily.  Marland Kitchen glucose blood (FREESTYLE LITE) test strip Use as instructed to test sugar once daily.  Dx code:11.10  . Lancets (FREESTYLE) lancets USE AS DIRECTED TO TEST BLOOD SUGAR  . loratadine (CLARITIN) 10 MG tablet Take 1 tablet (10 mg total) by mouth daily.  . metFORMIN (GLUCOPHAGE) 500 MG tablet TAKE 2 TABLETS(1000 MG) BY MOUTH TWICE DAILY WITH A MEAL  . Multiple Vitamins-Minerals (MULTIVITAMIN WITH MINERALS) tablet Take 1 tablet by mouth daily.  Marland Kitchen MYRBETRIQ 50 MG TB24 tablet Take 50 mg by mouth daily.  Marland Kitchen omeprazole (PRILOSEC) 20 MG capsule Take 20 mg by mouth daily before breakfast.  . polyethylene glycol (MIRALAX / GLYCOLAX) packet Take 17 g by mouth daily as needed for moderate constipation.  . polyvinyl alcohol (LIQUIFILM TEARS) 1.4 % ophthalmic solution Place 1 drop into the right eye as needed for dry eyes.  . pravastatin (PRAVACHOL) 20 MG tablet Take 1 tablet (20 mg total) by mouth every evening.  . tizanidine (ZANAFLEX) 2 MG capsule Take 1 capsule (2 mg total) by mouth 3 (three) times daily as needed for muscle spasms.  Marland Kitchen tolterodine (DETROL LA) 2 MG 24 hr capsule Take 1 capsule (2 mg total) by mouth daily.  Marland Kitchen tolterodine (DETROL LA) 4 MG 24 hr capsule TAKE 1 CAPSULE BY MOUTH EVERY DAY  . vitamin C (ASCORBIC ACID) 500 MG tablet Take 1,000 mg by mouth  daily.   . Vitamin D, Cholecalciferol, 50 MCG (2000 UT) CAPS Take 2,000 Units by mouth every morning.   No facility-administered encounter medications on file as of 12/03/2020.    Patient Active Problem List   Diagnosis Date Noted  . Healthcare maintenance 07/09/2020  . RLQ abdominal pain 06/13/2020  . Family history of colon cancer 06/13/2020  . Trigger finger, left ring finger  01/30/2020  . Allergic rhinitis 01/30/2020  . Xerostomia 10/10/2019  . AMS (altered mental status) 10/01/2019  . Acute encephalopathy   . Postmenopausal vaginal bleeding 06/20/2019  . Pressure injury of skin 06/09/2019  . CKD (chronic kidney disease) stage 3, GFR 30-59 ml/min (HCC)   . Diabetic ketoacidosis without coma associated with type 2 diabetes mellitus ([place])   . Abnormality of gait 11/16/2018  . Gastroesophageal reflux disease   . Hiatal hernia 12/15/2017  . Anemia 12/15/2017  . Multiple sclerosis ([place]) 12/14/2017  . Neurogenic bladder 12/14/2017  . Suprapubic catheter ([place]) 12/14/2017  . Osteoporosis 12/14/2017  . Controlled diabetes mellitus type 2 with complications ([place]) [id number]  . Hyperlipidemia 12/14/2017    Conditions to be addressed/monitored:  Level of care concerns  Care Plan : Social Work  Updates made by Claudia Cane, LCSW since 12/03/2020 12:00 AM  Problem: needs support in the home   Long-Range Goal: in-home aide program at DSS/ currently on wait list   Start Date: 08/14/2020  Recent Progress: Not on track  Priority: High  Current barriers:   . Patient has decided not to move forward with the PACE program.  Her monthly fee will be 3,000 per month and she is not able to afford this.   . Patient will explore options for private pay in order to meet her needs. . Patient continue to experience difficulty with meeting ADL's at home due to limited support  . needs additional assistance in the home to meet this unmet need.   Clinical Interventions : . Assessed needs, barriers and how progressing . Explained In-home aide program, assisted patient with calling to be placed on the wait list Ms Claudia Walter [phone]) . Provided phone number for her to check on wait list status [phone]) . Solution focus to explore all options for home support . Motivational interviewing provided . problem-solving facilitated . support and encouragement  provided . Emotional/Supportive Counseling;  Patient Goals/Self-Care Activities:  . Continue to follow up with in-home wait [phone])     Casimer Lanius, [place] / [place]   [phone] 3:57 PM

## 2020-12-03 NOTE — Patient Instructions (Addendum)
Visit Information  Goals Addressed            This Visit's Progress   . Support in the home   On track    Patient Goals/Self-Care Activities:  . Continue to follow up with in-home wait [phone])     Patient verbalizes understanding of instructions provided today and agrees to view in [place].   Telephone follow up appointment with care management team member scheduled for:02/21/21  Maurine Cane, LCSW

## 2020-12-05 ENCOUNTER — Encounter: Payer: Self-pay | Admitting: Physical Medicine and Rehabilitation

## 2020-12-05 ENCOUNTER — Ambulatory Visit: Payer: Medicare Other | Admitting: Neurology

## 2020-12-05 NOTE — Progress Notes (Deleted)
PATIENT: Claudia Walter DOB: 11/20/1945  REASON FOR VISIT: follow up HISTORY FROM: patient  HISTORY OF PRESENT ILLNESS: Today 12/05/20  HISTORY HISTORY OF PRESENT ILLNESS: Claudia Walter a 75 year old female, seen in refer by primary care physician [name], for evaluation of relapsing remitting multiple sclerosis, initial evaluation was on January 18, 2018,  She was brought in by transportation, in electronic wheelchair, alone at today's clinical visit,  I was able to review old her previous neurological evaluation from [facility] by Dr. [name], most recent note was onJuly 20, 2018,  She is a retired Public relations account executive, was diagnosed with multiple sclerosis since 1993, she presented with thoracic myelopathy, initial symptoms involved pinching around her waist felt like tightening sensation, paresthesia involving all limbs, lumbar puncture was positive, Evoked potential was positive,  She had slowly progressive worsening gait abnormality, remembered in 2000, after her mother passed away, she began to ambulate with a cane, in 2005, after her husband passed away, she began to use her walker, has to quit her job due to worsening gait difficulty, was given a course of IV steroid, did improve her strength temporarily, Betaseron since 2004, she moved to [place] in 2007 due to family stress, was followed by local neurologist [name] at, , [facility], noticed continue progressive gait abnormality, eventually begin to use wheelchair in 2009, but still able to take a few steps with his walker, switched to Copaxone in 2013,   In 2014 she injured her right hip, later also suffered right tibial and fibular fracture, wastreated with a bracefor 8 weeks, was never able to walk again, she also stopped long-term immunomodification therapy since 2014, had a short trial of Ampyra without helping her gait.  Per record, last MRI was in 2018, showed  stable hemisphere lesions,  Laboratory evaluation per record showed: vitamin D was 68, she is JC virus positive with index of 0.41, Tegretol level was 9.4,  She is now living with her son, and his girlfriend and children, she is tearful during today's interview,she has been inher currentwheelchair for 10 years, it has become malfunction, she has more difficulty positioning herself in the wheelchair, also complains of worsening bilateral hip pain due to difficulty positioning, developed decubitus ulcer.  She had suprapubic catheter for 10 years, was recently evaluated by urologist [name] at [facility] urologist, ultrasound of kidney was reported normal  MRI of the brain without contrast in November 2018 from [place] reported mild cerebral atrophy, with proportional ventricular dilatation, scattered supratentorium periventricular, and subcortical white matter hyper intensity, was reported as stable,  Laboratory evaluation in June 2018, normal CBC hemoglobin of 12.8, CMP creatinine of 0.76, normal TSH 3.78, vitamin D of 41, mild elevated alkaline phosphate 158, B12 is 564,  UPDATE April 27 2018:YY She continue with electronic wheelchair today, was dropped in by transportation, she reported intermittent episode of sharp pain starting at left knee, radiating to left shin, lasting for hours, ibuprofen only provide limited help  She also reported that this morning, she was very tired, difficulty to be awakened, similar to the symptoms she had previously for her UTI, UPDATE1/22/2020CMMs.Odowd,75 year old female returns for follow-up with history of multiple sclerosis likely secondary progressive stopped her immunomodulation therapy in 2014. She was diagnosed in 1993. She has a new Clinical research associate. At her last visit she was complaining of intermittent sharp pain at the left knee but that is much better and she is no longer taking tramadol.  She continues to have home health for CNA  services for bathing and her suprapubic catheter is changed by nursing. She did have a decubitus ulcer but that is mostly healed. She was getting physical therapy but has plateaued. She remains on baclofen Wellbutrin and Neurontin from this office. She requires help to transfer from bed to to wheelchair. She lives with her son and his girlfriend. Last MRI of the brain in 2018 was stable.She returns for reevaluation  Update August 30, 2019 SS: She is here today alone.  Since last seen, she was hospitalized in August for urosepsis and elevated glucose.  She is now taking insulin.  She remains on Wellbutrin, gabapentin, and baclofen.  She admits, she often only takes the medications once a day.  She is an Clinical research associate.  She lives with her son and his girlfriend.  Last MRI of the brain was in 2018 and was stable.  She has been off immunomodulating medication since 2014.  She has a suprapubic catheter.  She says sometimes at night she may have stabbing pain to her right ankle when lying on her right side, is relieved with position change.  She has a CNA who comes once a week to help with bathing, a nurse comes once a week for catheter care.  She did have sacral ulcers, but have now healed.  She requires assistance with bathing and ADLs.  She is able to administer her medications.  She wishes to have further home health assistance, thinks she could benefit from physical therapy or rehab.  Update Feb 28, 2020 SS: She was hospitalized in December for altered mental status, thought to have taken too much medication on accident, her gabapentin was decreased to 300 mg twice a day, baclofen was discontinued.  Urine was suspicious for UTI, there was some mention of difficulty for her to manage her home medications, SNF was recommended, the patient refused.  She lives at home with her son and daughter-in-law, here today alone.  She needs a Engineering geologist for her wheelchair via [employer].  She is doing okay on  lower dose of gabapentin with pain in feet, has not noticed much change since off of baclofen.  She no longer has home health, a nurse comes once a month to change her suprapubic catheter, she is nonambulatory, essentially total care. If she has pain at night, she will change her position. She has done ok off baclofen, says she was on since 1994.   Update December 05, 2020 SS:   REVIEW OF SYSTEMS: Out of a complete 14 system review of symptoms, the patient complains only of the following symptoms, and all other reviewed systems are negative.  N/a  ALLERGIES: Allergies  Allergen Reactions  . Sulfa Antibiotics Nausea And Vomiting    HOME MEDICATIONS: Outpatient Medications Prior to Visit  Medication Sig Dispense Refill  . buPROPion (WELLBUTRIN) 75 MG tablet Take 1 tablet (75 mg total) by mouth daily. 90 tablet 3  . fluticasone (FLONASE) 50 MCG/ACT nasal spray Place 2 sprays into both nostrils daily. 16 g 6  . gabapentin (NEURONTIN) 300 MG capsule Take 1 capsule (300 mg total) by mouth 2 (two) times daily. 60 capsule 11  . glucose blood (FREESTYLE LITE) test strip Use as instructed to test sugar once daily.  Dx code:11.10 100 each 65  . Lancets (FREESTYLE) lancets USE AS DIRECTED TO TEST BLOOD SUGAR 100 each 12  . loratadine (CLARITIN) 10 MG tablet Take 1 tablet (10 mg total) by mouth  daily. 30 tablet 11  . metFORMIN (GLUCOPHAGE) 500 MG tablet TAKE 2 TABLETS(1000 MG) BY MOUTH TWICE DAILY WITH A MEAL 360 tablet 1  . Multiple Vitamins-Minerals (MULTIVITAMIN WITH MINERALS) tablet Take 1 tablet by mouth daily.    Marland Kitchen MYRBETRIQ 50 MG TB24 tablet Take 50 mg by mouth daily.    Marland Kitchen omeprazole (PRILOSEC) 20 MG capsule Take 20 mg by mouth daily before breakfast.    . polyethylene glycol (MIRALAX / GLYCOLAX) packet Take 17 g by mouth daily as needed for moderate constipation.    . polyvinyl alcohol (LIQUIFILM TEARS) 1.4 % ophthalmic solution Place 1 drop into the right eye as needed for dry eyes. 15 mL 0   . pravastatin (PRAVACHOL) 20 MG tablet Take 1 tablet (20 mg total) by mouth every evening. 90 tablet 3  . tizanidine (ZANAFLEX) 2 MG capsule Take 1 capsule (2 mg total) by mouth 3 (three) times daily as needed for muscle spasms. 60 capsule 1  . tolterodine (DETROL LA) 2 MG 24 hr capsule Take 1 capsule (2 mg total) by mouth daily. 30 capsule 30  . tolterodine (DETROL LA) 4 MG 24 hr capsule TAKE 1 CAPSULE BY MOUTH EVERY DAY 90 capsule 3  . vitamin C (ASCORBIC ACID) 500 MG tablet Take 1,000 mg by mouth daily.     . Vitamin D, Cholecalciferol, 50 MCG (2000 UT) CAPS Take 2,000 Units by mouth every morning.     No facility-administered medications prior to visit.    PAST MEDICAL HISTORY: Past Medical History:  Diagnosis Date  . Depression   . Diabetes mellitus without complication ([place])   . GERD (gastroesophageal reflux disease)   . Hyperlipemia   . Multiple sclerosis ([place])   . Osteoporosis     PAST SURGICAL HISTORY: Past Surgical History:  Procedure Laterality Date  . CESAREAN SECTION N/A    X 2  . ESOPHAGOGASTRODUODENOSCOPY (EGD) WITH PROPOFOL N/A 04/12/2018   Procedure: ESOPHAGOGASTRODUODENOSCOPY (EGD) WITH PROPOFOL;  Surgeon: Yetta Flock, MD;  Location: [facility];  Service: Gastroenterology;  Laterality: N/A;  . HIP FRACTURE SURGERY Right     FAMILY HISTORY: Family History  Problem Relation Age of Onset  . Diabetes Mother   . Heart attack Mother   . Colon cancer Father     SOCIAL HISTORY: Social History   Socioeconomic History  . Marital status: Widowed    Spouse name: Not on file  . Number of children: 2  . Years of education: college  . Highest education level: Bachelor's degree (e.g., BA, AB, BS)  Occupational History  . Occupation: Disabled  Tobacco Use  . Smoking status: Former Smoker    Types: Cigarettes    Quit date: 1978    Years since quitting: 44.1  . Smokeless tobacco: Never Used  Vaping Use  . Vaping Use: Never used  Substance and  Sexual Activity  . Alcohol use: Yes    Comment: occasional glass of wine  . Drug use: Never  . Sexual activity: Not on file  Other Topics Concern  . Not on file  Social History Narrative   Lives at home with her son, his girlfriend and her children.   Left-handed.   1-2 cups caffeine daily.   Social Determinants of Health   Financial Resource Strain: Not on file  Food Insecurity: Not on file  Transportation Needs: Not on file  Physical Activity: Not on file  Stress: Not on file  Social Connections: Not on file  Intimate Partner Violence: Not  on file    PHYSICAL EXAM  There were no vitals filed for this visit. There is no height or weight on file to calculate BMI.  Generalized: Well developed, in no acute distress   Neurological examination  Mentation: Alert oriented to time, place, history taking. Follows all commands speech and language fluent Cranial nerve II-XII: Pupils were equal round reactive to light. Extraocular movements were full, visual field were full on confrontational test. Facial sensation and strength were normal. Head turning and shoulder shrug  were normal and symmetric. Motor: Good strength in upper extremities, with lower extremities, mild stiffness, fairly good with active ROM, able to elevate right leg against gravity, with left this is less, proximal and distal weakness of lower extremities left greater than right Sensory: Sensory testing is intact to soft touch on all 4 extremities. No evidence of extinction is noted.  Coordination: Cerebellar testing reveals good finger-nose-finger bilaterally Gait and station: Is nonambulatory, in wheelchair Reflexes: Deep tendon reflexes are symmetric but depressed bilaterally  DIAGNOSTIC DATA (LABS, IMAGING, TESTING) - I reviewed patient records, labs, notes, testing and imaging myself where available.  Lab Results  Component Value Date   WBC 10.0 10/02/2019   HGB 11.1 (L) 10/02/2019   HCT 36.7 10/02/2019    MCV 87.4 10/02/2019   PLT 391 10/02/2019      Component Value Date/Time   NA 144 10/23/2020 1521   K 4.3 10/23/2020 1521   CL 102 10/23/2020 1521   CO2 27 10/23/2020 1521   GLUCOSE 97 10/23/2020 1521   GLUCOSE 125 (H) 06/04/2020 1550   BUN 20 10/23/2020 1521   CREATININE 1.40 (H) 10/23/2020 1521   CALCIUM 9.9 10/23/2020 1521   PROT 7.3 09/30/2019 2223   ALBUMIN 3.6 09/30/2019 2223   AST 17 09/30/2019 2223   ALT 14 09/30/2019 2223   ALKPHOS 122 09/30/2019 2223   BILITOT 0.1 (L) 09/30/2019 2223   GFRNONAA 37 (L) 10/23/2020 1521   GFRAA 42 (L) 10/23/2020 1521   Lab Results  Component Value Date   CHOL 250 (H) 12/14/2017   HDL 41 12/14/2017   LDLCALC 159 (H) 12/14/2017   TRIG 249 (H) 12/14/2017   CHOLHDL 6.1 (H) 12/14/2017   Lab Results  Component Value Date   HGBA1C 6.2 07/09/2020   No results found for: [id number] Lab Results  Component Value Date   TSH 3.807 09/30/2019      ASSESSMENT AND PLAN 75 y.o. year old female  has a past medical history of Depression, Diabetes mellitus without complication ([place]), GERD (gastroesophageal reflux disease), Hyperlipemia, Multiple sclerosis ([place]), and Osteoporosis. here with:  1.  Multiple sclerosis, likely secondary progressive 2.  Neurogenic bladder -Overall, stable, has done okay on lower dose of gabapentin, discontinuation of baclofen, could benefit from [facility] services -She has stopped long-term immunomodulating therapy since 2014 -Last MRI of the brain, cervical, thoracic spine in November 2018 -Continue gabapentin 300 mg twice a day -Continue Wellbutrin 75 mg daily -Will stay off baclofen since discontinued following hospitalization in November, really has not seen much change without baclofen -We will again place order for home health, to see if services are available for CNA, social work, she might would consider SNF/AL, she previously lived in [place] before she moved to [place] her the # for [employer] to contact  for charger for wheelchair -Follow-up in 6 months or sooner if needed  I spent 30 minutes of face-to-face and non-face-to-face time with patient.  This included previsit chart review,  lab review, study review, order entry, electronic health record documentation, patient education.  Butler Denmark, AGNP-C, DNP 12/05/2020, 5:55 AM [facility] [address] [place], [place] [address] [phone]

## 2020-12-15 IMAGING — US US PELVIS COMPLETE WITH TRANSVAGINAL
1 series · 13 of 25 positions shown · non-contrast
Comparison: None

CLINICAL DATA: Postmenopausal bleeding



[Series 1: us pelvis complete with transvaginal · 0.22mm/px · 13 of 31 slices shown]
[im 1/31]
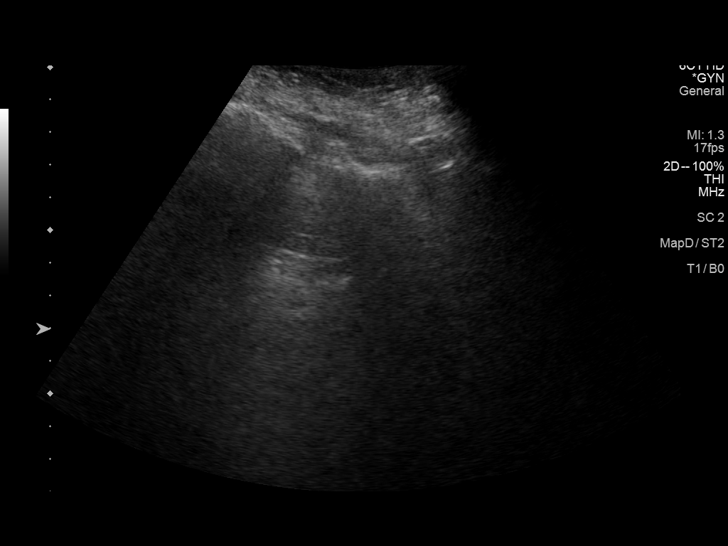
[im 3/31]
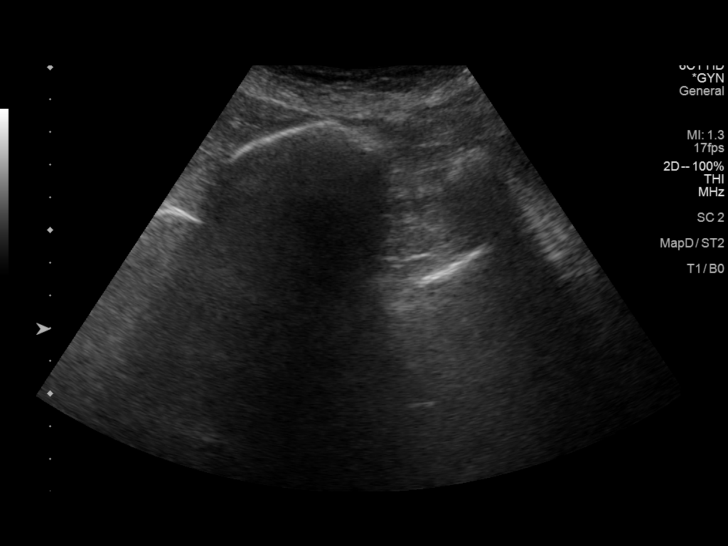
[im 6/31]
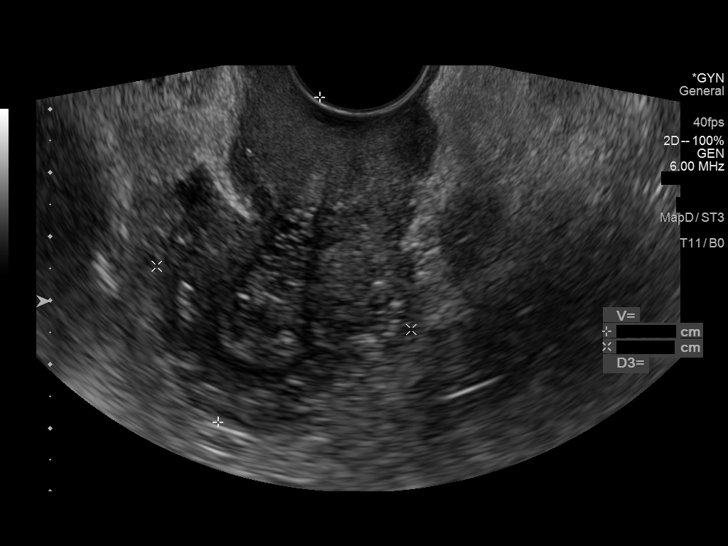
[im 8/31]
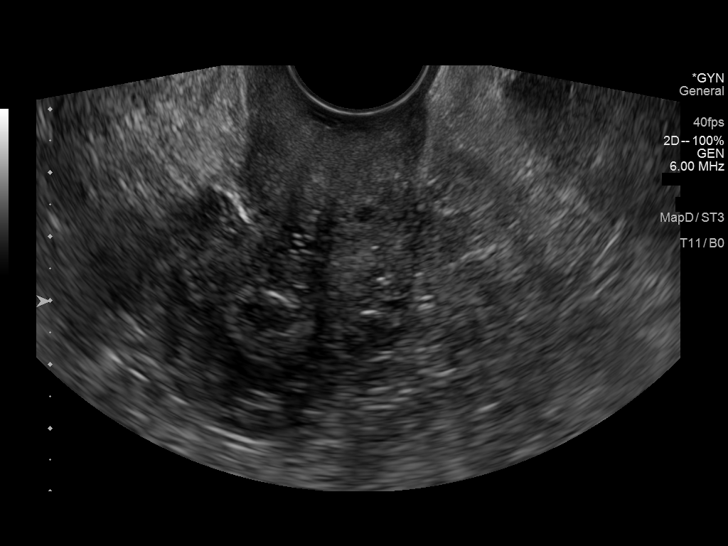
[im 11/31]
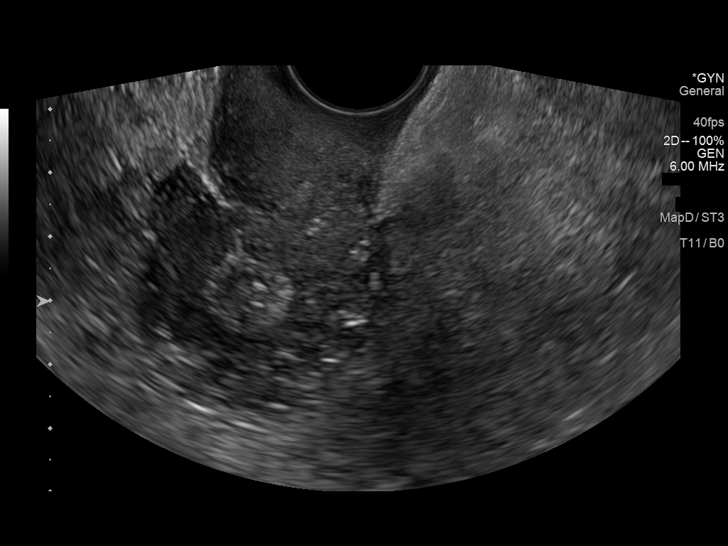
[im 13/31]
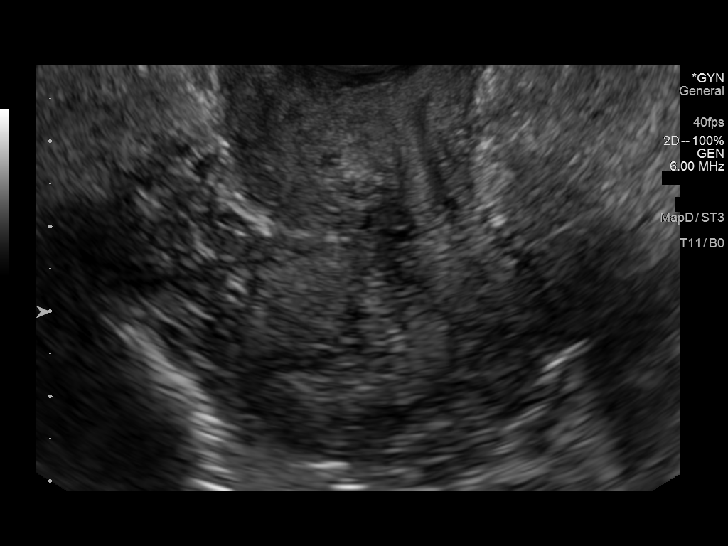
[im 16/31]
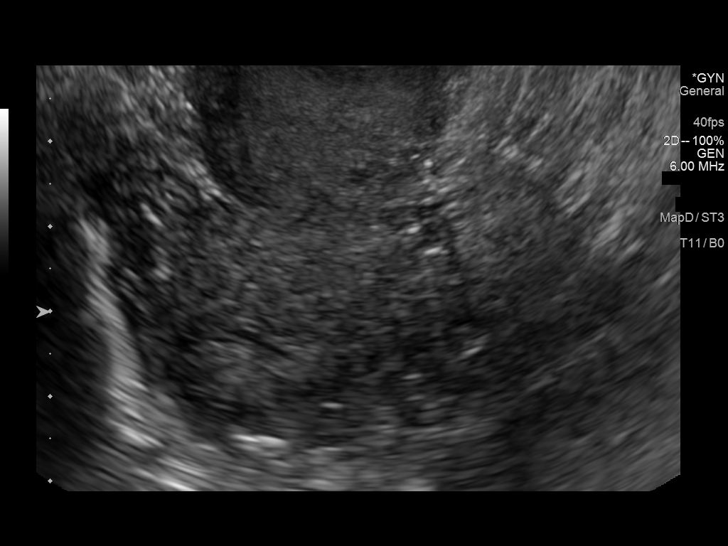
[im 18/31]
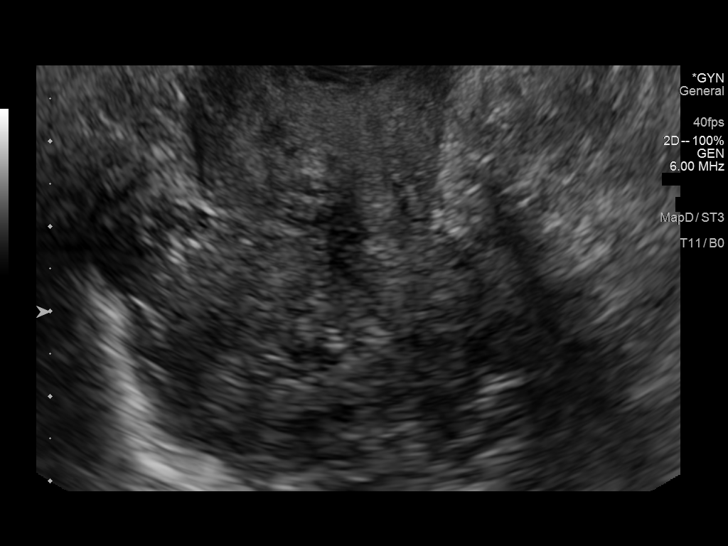
[im 21/31]
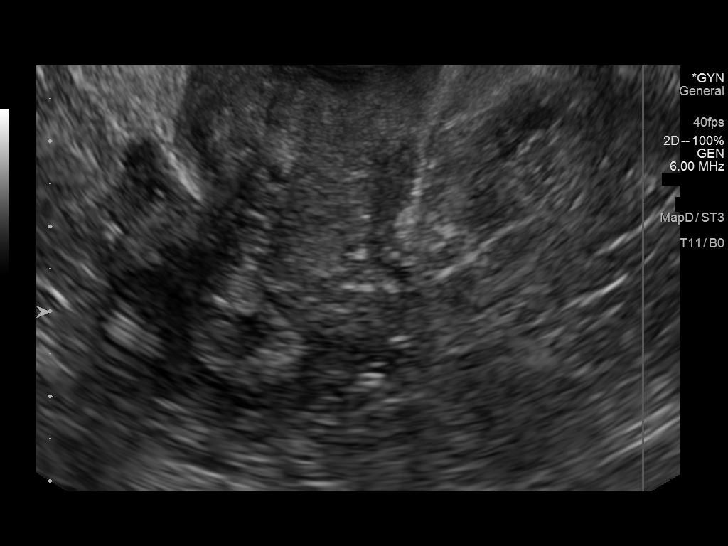
[im 23/31]
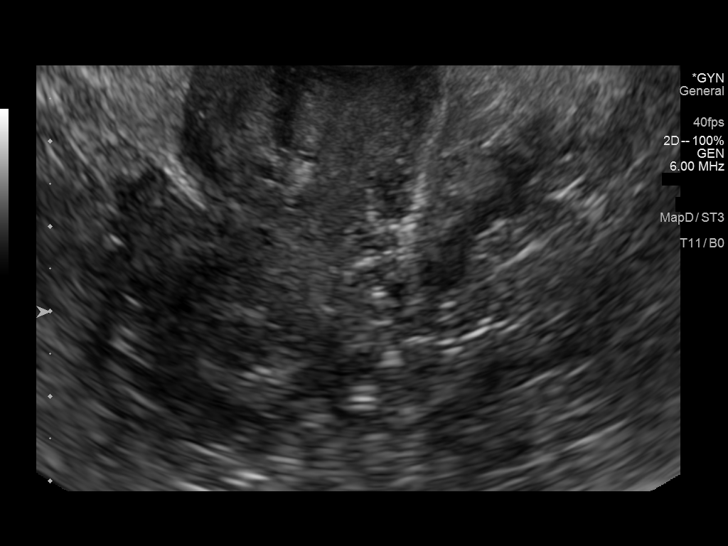
[im 26/31]
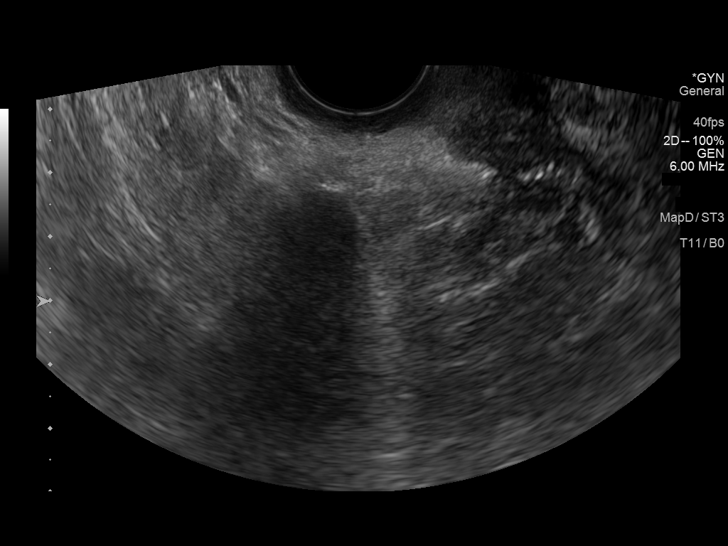
[im 28/31]
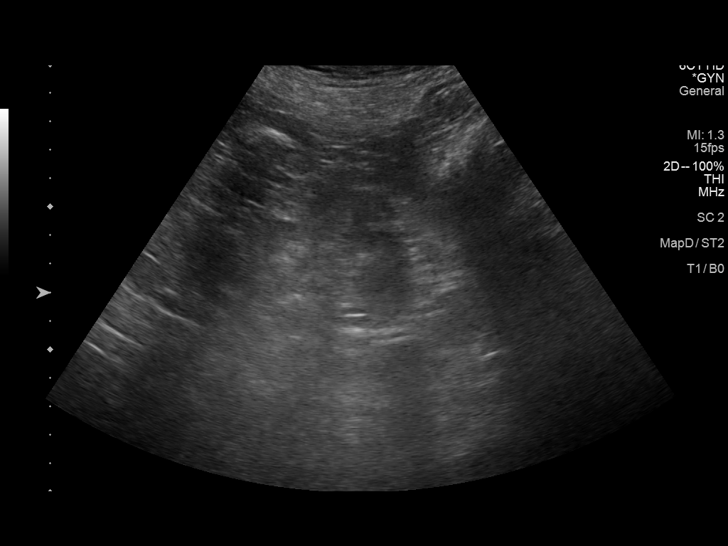
[im 31/31]
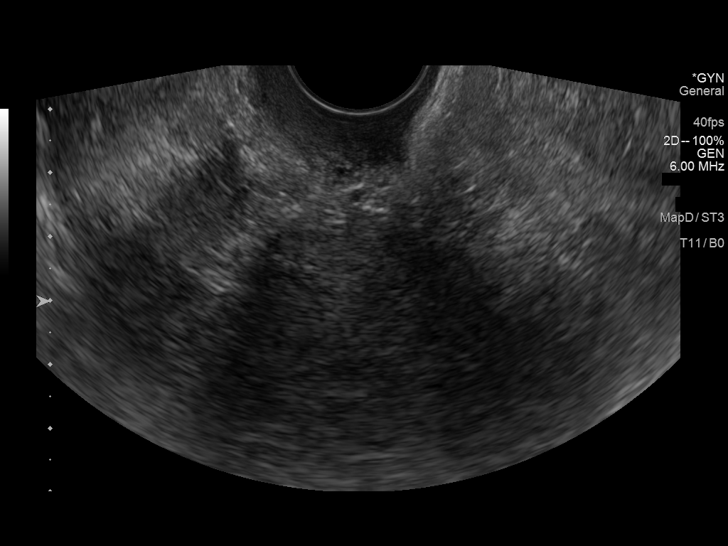

[13 of 25 positions shown; findings below may reference images not displayed]

FINDINGS: Uterus

Measurements: 5.3 x 4.1 x 4.4 cm. No fibroids or other mass
visualized.

Endometrium

Thickness: 15 mm. Heterogeneous echotexture is seen in the
endometrium with no focal mass.

Right ovary

Not visualized.

Left ovary

Not visualized.

Other findings

No abnormal free fluid.
IMPRESSION: 1. The endometrium is thickened and heterogeneous measuring 15 mm.
This is abnormal for age. In the setting of post-menopausal
bleeding, endometrial sampling is indicated to exclude carcinoma. If
results are benign, sonohysterogram should be considered for focal
lesion work-up. (Ref: Radiological Reasoning: Algorithmic Workup of
Abnormal Vaginal Bleeding with Endovaginal Sonography and
Sonohysterography. AJR [id number]; [id number])

## 2020-12-22 ENCOUNTER — Other Ambulatory Visit: Payer: Self-pay | Admitting: Family Medicine

## 2020-12-22 DIAGNOSIS — G35 Multiple sclerosis: Secondary | ICD-10-CM

## 2021-01-03 ENCOUNTER — Other Ambulatory Visit: Payer: Self-pay | Admitting: Family Medicine

## 2021-01-10 ENCOUNTER — Telehealth: Payer: Self-pay | Admitting: Family Medicine

## 2021-01-10 NOTE — Telephone Encounter (Signed)
Received request to fill out section B of [facility] [place] eligibility questionnaire form. I attempted to call patient to ask some clarifying questions to be able to fill out the form. Called patient and left HIPAA compliant VM.   If patient returns call, need to know 2 things:  1) How far can the applicant travel with her wheelchair- less than 3 blocks, 3 blocks, 6 blocks, 9 or more blocks? 2) How long can the applicant wait outside (with mobility aid): less than 15 minutes, 15 minutes, 30 minutes?  Once I have the answers to these questions, I will complete the form.  Gladys Damme, MD [facility], PGY-2

## 2021-01-13 ENCOUNTER — Encounter: Payer: Medicare Other | Admitting: Physical Medicine and Rehabilitation

## 2021-01-14 NOTE — Telephone Encounter (Addendum)
Patient returns call to nurse line to answer provider questions.   Patient states that she can travel alone for five blocks and in nice weather she can wait outside for 30 minutes.   Forwarding message to PCP.   Talbot Grumbling, RN

## 2021-01-27 NOTE — Telephone Encounter (Signed)
Completed form with information provided by patient. Form mailed back to patient on 4/1.  Gladys Damme, MD [facility], PGY-2

## 2021-01-30 ENCOUNTER — Other Ambulatory Visit: Payer: Self-pay | Admitting: Family Medicine

## 2021-02-05 ENCOUNTER — Other Ambulatory Visit: Payer: Self-pay | Admitting: Family Medicine

## 2021-02-05 DIAGNOSIS — J302 Other seasonal allergic rhinitis: Secondary | ICD-10-CM

## 2021-02-05 DIAGNOSIS — G35 Multiple sclerosis: Secondary | ICD-10-CM

## 2021-02-12 ENCOUNTER — Telehealth: Payer: Self-pay

## 2021-02-12 ENCOUNTER — Other Ambulatory Visit: Payer: Self-pay | Admitting: Family Medicine

## 2021-02-12 DIAGNOSIS — R300 Dysuria: Secondary | ICD-10-CM

## 2021-02-12 NOTE — Telephone Encounter (Signed)
Received VM from [facility] RN regarding patient. HH RN was seeing patient for routine catheter maintenance and reports the following: malodorous urine and sediment. [facility] RN encouraged patient to increase PO fluids, however, would like to know if the doctor has any new orders.   Called patient to discuss any additional symptoms. Patient did not answer, left HIPAA compliant VM for patient to return call to office to discuss further.  [facility] nurse to get more information. RN reports that patient does not report fever, abdominal or back pain.   Please advise if UA should be obtained. Patient's next home health visit is scheduled for next week.   To PCP  Talbot Grumbling, RN

## 2021-02-13 NOTE — Telephone Encounter (Signed)
Called Erica and provided with verbal orders for UA and urine culture.   Talbot Grumbling, RN

## 2021-02-21 ENCOUNTER — Ambulatory Visit: Payer: Medicare Other | Admitting: Licensed Clinical Social Worker

## 2021-02-21 NOTE — Chronic Care Management (AMB) (Signed)
    Clinical Social Work  Care Management   Phone Outreach    02/21/2021 Name: Claudia Walter MRN: [id number] DOB: July 04, 1946  Claudia Walter is a 75 y.o. year old female who is a primary care patient of Gladys Damme, MD .   F/U phone call today to assess needs, and progress with care plan goals.   Unable to keep phone appointment today and requested to reschedule.  Plan:Patient states she will call LCSW back next week. LCSW will wait for return call.  If no return call is received, will reach out to patient in 30 days.  Review of patient status, including review of consultants reports, relevant laboratory and other test results, and collaboration with appropriate care team members and the patient's provider was performed as part of comprehensive patient evaluation and provision of care management services.    Casimer Lanius, [place] / [place]   [phone] 2:54 PM

## 2021-02-26 ENCOUNTER — Other Ambulatory Visit: Payer: Self-pay

## 2021-02-26 ENCOUNTER — Ambulatory Visit: Payer: Medicare Other | Admitting: Neurology

## 2021-02-27 ENCOUNTER — Telehealth: Payer: Self-pay | Admitting: Family Medicine

## 2021-02-27 MED ORDER — FREESTYLE LANCETS MISC: 12 refills | Status: AC

## 2021-02-27 NOTE — Telephone Encounter (Signed)
Message routed to PCP. Salvatore Marvel, CMA

## 2021-02-27 NOTE — Telephone Encounter (Signed)
Erica from [facility] called to let the doctor know she changed patients cath this morning she states urine was clear no odor, but it did have sediment in there. Thanks!

## 2021-03-10 ENCOUNTER — Other Ambulatory Visit: Payer: Self-pay | Admitting: *Deleted

## 2021-03-10 ENCOUNTER — Encounter
Payer: Medicare Other | Attending: Physical Medicine and Rehabilitation | Admitting: Physical Medicine and Rehabilitation

## 2021-03-10 ENCOUNTER — Other Ambulatory Visit: Payer: Self-pay

## 2021-03-10 ENCOUNTER — Encounter: Payer: Self-pay | Admitting: Physical Medicine and Rehabilitation

## 2021-03-10 VITALS — BP 80/52 | HR 90 | Temp 98.8°F

## 2021-03-10 DIAGNOSIS — G35 Multiple sclerosis: Secondary | ICD-10-CM | POA: Insufficient documentation

## 2021-03-10 DIAGNOSIS — R252 Cramp and spasm: Secondary | ICD-10-CM | POA: Diagnosis present

## 2021-03-10 DIAGNOSIS — N319 Neuromuscular dysfunction of bladder, unspecified: Secondary | ICD-10-CM | POA: Insufficient documentation

## 2021-03-10 DIAGNOSIS — M21371 Foot drop, right foot: Secondary | ICD-10-CM | POA: Diagnosis present

## 2021-03-10 DIAGNOSIS — M21372 Foot drop, left foot: Secondary | ICD-10-CM | POA: Diagnosis present

## 2021-03-10 MED ORDER — DANTROLENE SODIUM 50 MG PO CAPS: 50.0000 mg | ORAL_CAPSULE | Freq: Every day | ORAL | 5 refills | Status: AC

## 2021-03-10 MED ORDER — GABAPENTIN 300 MG PO CAPS: 300.0000 mg | ORAL_CAPSULE | Freq: Two times a day (BID) | ORAL | 2 refills | Status: DC

## 2021-03-10 NOTE — Patient Instructions (Addendum)
Pt is a 75 yr old female with hx of MS, W/C dependence, spasticity, DM, osteoporosis and neuropathic pain here for evaluation of above dx's.  Also has SPC tube- is changed monthly- [facility] do that.  Had MS for 20+ years.    1. To help reduce risk of pressure ulcers- needs t either be completely upright and every 15-20 minutes can go into COMPLETE tilt, or sit in complete tilt and come upright.  2. Cannot refer to outpt PT because gets H/H for catheter changes will refer to H/H PT.    3. Needs PRAFOs vs Prevalons- will have [facility] evaluate for appropriate for B/L foot drop  4. Con't Myrbetriq for bladder and SPC per Urology.   5. Dantrolene- Dantrolene  Side effects- loose stools, sedation, and possible mild weakness  Start 50 mg nightly x 1 week Then 50 mg 2x/day x 1 week Then 50 mg 3x/day x 1 week Then 100 mg 2x/day - until dose changed  Needs CMP/LFTs/labs checked in 1 month, 3 months and every 6 months while on medication  6. Might need to add Fiber- pill form, suggest if need be. Not taking miralax anymore.    7. Cannot increase Tizanidine since BP is too low- 80/52 today.  IF BP gets better, can think about it.   8. If Dantrolene doesn't work, might need Botox of adductors.   9. F/U in 2 months- double appointment- MS.  10. Needs CMP/liver function test-  in 1 month- will order labs- at [place]- don't need to FAST

## 2021-03-10 NOTE — Progress Notes (Signed)
Subjective:    Patient ID: Claudia Walter, female    DOB: 1946-05-23, 75 y.o.   MRN: [id number]  HPI Pt is a 75 yr old female with hx of MS, W/C dependence, spasticity, DM, osteoporosis and neuropathic pain here for evaluation of above dx's.  Also has SPC tube- is changed monthly- [facility] do that.  Had MS for 20+ years.   In power w/c-Has Permobil M3 and Ulice Dash 2 w/c cushion- Is Numotion- current w/c is 75 years old. Cushion is same.     Was in w/c due to: many different operations of R hip due to infection/fracture and took all hardware out- not even ABX spacer- so not due to MS.  Fractured initially in 10/2007.   Wants to be able to get OOB on own.  Hates having to "beg" son to help to get OOB.   Right now, lines up w/c and pulls legs over- bed higher than w/c.  And pulls self over once son moves legs.    Has not stood since 09/2017. Since moved to [place].    Just got over a pressure ulcer- where buttock and leg come together/crease- wasn't open-  Has hx of frequent pressure ulcers.    Taken off Baclofen- in 11/21- PCP didn't like something in blood- to do with kidney. Due to Cr clearance of 30-40. Muscle tightness got worse.  Never had liver issues that she's aware of.  On Zanaflex 2 mg BID.   on Gabapentin for nerve pain- 300 mg QHS.  Nerve pain in ankles- like someone stabbing ankles.  Haven't had Ankle pain in 3 months.   Off Detrol- On Myrbetriq 50 mg QHS for bladder 2 months ago- was having bladder spasms. Urine would come into diaper around [facility] or around urethra.  Had botox of bladder when in [place] >10 yrs ago. It did work for a few months.   Hasn't worked with therapy lately.  Tries to do quad muscles to push legs out.  Would prefer outpt PT over H/H PT.    Social Hx: Uses [employer] bus to get to therapy/doctor's appointments    Pain Inventory Average Pain 5 Pain Right Now 0 My pain is intermittent, sharp and stabbing  LOCATION OF PAIN  Hip,  leg BOWEL Number of stools per week: 2 Oral laxative use No  Type of laxative na Enema or suppository use No  History of colostomy No  Incontinent No   BLADDER Suprapubic In and out cath, frequency na  Able to self cath na Bladder incontinence No  Frequent urination No  Leakage with coughing No  Difficulty starting stream No  Incomplete bladder emptying No    Mobility ability to climb steps?  no do you drive?  no use a wheelchair needs help with transfers  Function disabled: date disabled 09/2004 I need assistance with the following:  dressing and bathing  Neuro/Psych tremor spasms  Prior Studies new pt  Physicians involved in your care new pt   Family History  Problem Relation Age of Onset  . Diabetes Mother   . Heart attack Mother   . Colon cancer Father    Social History   Socioeconomic History  . Marital status: Widowed    Spouse name: Not on file  . Number of children: 2  . Years of education: college  . Highest education level: Bachelor's degree (e.g., BA, AB, BS)  Occupational History  . Occupation: Disabled  Tobacco Use  . Smoking status: Former Smoker  Types: Cigarettes    Quit date: 1978    Years since quitting: 44.4  . Smokeless tobacco: Never Used  Vaping Use  . Vaping Use: Never used  Substance and Sexual Activity  . Alcohol use: Yes    Comment: occasional glass of wine  . Drug use: Never  . Sexual activity: Not on file  Other Topics Concern  . Not on file  Social History Narrative   Lives at home with her son, his girlfriend and her children.   Left-handed.   1-2 cups caffeine daily.   Social Determinants of Health   Financial Resource Strain: Not on file  Food Insecurity: Not on file  Transportation Needs: Not on file  Physical Activity: Not on file  Stress: Not on file  Social Connections: Not on file   Past Surgical History:  Procedure Laterality Date  . CESAREAN SECTION N/A    X 2  .  ESOPHAGOGASTRODUODENOSCOPY (EGD) WITH PROPOFOL N/A 04/12/2018   Procedure: ESOPHAGOGASTRODUODENOSCOPY (EGD) WITH PROPOFOL;  Surgeon: Yetta Flock, MD;  Location: [facility];  Service: Gastroenterology;  Laterality: N/A;  . HIP FRACTURE SURGERY Right    Past Medical History:  Diagnosis Date  . Depression   . Diabetes mellitus without complication ([place])   . GERD (gastroesophageal reflux disease)   . Hyperlipemia   . Multiple sclerosis ([place])   . Osteoporosis    BP (!) 80/52   Pulse 90   Temp 98.8 F (37.1 C)   SpO2 92%   Opioid Risk Score:   Fall Risk Score:  `1  Depression screen PHQ 2/9  Depression screen [facility] 2/9 01/29/2020 10/10/2019 07/06/2019 06/20/2019 09/05/2018 01/20/2018  Decreased Interest 0 0 0 0 0 0  Down, Depressed, Hopeless 0 0 0 0 0 0  PHQ - 2 Score 0 0 0 0 0 0    Review of Systems  Musculoskeletal:       Spasms Hip and leg pain  Neurological: Positive for tremors.  All other systems reviewed and are negative.      Objective:   Physical Exam 80/52- (is on Zanaflex) Awake, alert, in power w/c, NAD  B/L legs splayed  SPC in place- bag in purse In keeps the w/c in some recline/tilt in space.   MS:  RLE: HF 0/5, KE 3-/5, KF 3-/5, DF/PF 0/5 LLE: HF 4-/5 not through full ROM; KE 4/5, KF 4-/5, DF 3-/5 and PF 3/5  Neuro: MAS of 4 in adductors; MAS of 3-4 in hips/knees and ankles- developing contractures       Assessment & Plan:   Pt is a 75 yr old female with hx of MS, W/C dependence, spasticity, DM, osteoporosis and neuropathic pain here for evaluation of above dx's.  Also has SPC tube- is changed monthly- [facility] do that.  Had MS for 20+ years.    1. To help reduce risk of pressure ulcers- needs t either be completely upright and every 15-20 minutes can go into COMPLETE tilt, or sit in complete tilt and come upright.  2. Cannot refer to outpt PT because gets H/H for catheter changes will refer to H/H PT.    3. Needs PRAFOs vs  Prevalons- will have [facility] evaluate for appropriate for B/L foot drop  4. Con't Myrbetriq for bladder and SPC per Urology.   5. Dantrolene- Dantrolene  Side effects- loose stools, sedation, and possible mild weakness  Start 50 mg nightly x 1 week Then 50 mg 2x/day x 1 week Then 50 mg  3x/day x 1 week Then 100 mg 2x/day - until dose changed  Needs CMP/LFTs/labs checked in 1 month, 3 months and every 6 months while on medication  6. Might need to add Fiber- pill form, suggest if need be. Not taking miralax anymore.    7. Cannot increase Tizanidine since BP is too low- 80/52 today.  IF BP gets better, can think about it.   8. If Dantrolene doesn't work, might need Botox of adductors.   9. F/U in 2 months- double appointment- MS.   10. Needs CMP since on Dantrolene to f/u on LFTs in 1 month- have ordered from [facility] in 1 month.   I spent a total of 45 minutes on appt- as detailed above.

## 2021-03-18 ENCOUNTER — Ambulatory Visit: Payer: Medicare Other | Admitting: Podiatry

## 2021-03-19 ENCOUNTER — Telehealth: Payer: Self-pay

## 2021-03-19 NOTE — Telephone Encounter (Signed)
Betsy from [facility] calling for PT verbal orders as follows:  2 time(s) weekly for 5 week(s)  Verbal orders given per [facility] protocol  Talbot Grumbling, RN

## 2021-03-24 IMAGING — CT CT HEAD W/O CM
3 of 4 series · 16 of 47 positions shown, 19 images · non-contrast
Comparison: Head CT dated 06/09/2019.

CLINICAL DATA: 73-year-old female with altered mental status.

EXAM:
CT HEAD WITHOUT CONTRAST
TECHNIQUE: Contiguous axial images were obtained from the base of the skull
through the vertex without intravenous contrast.

[Series 4: head 2.0 h70h · axial · 0.41mm/px · z∈[+1227,+1377]mm · 10 of 85 slices shown, 13 images]
[im 5/85  brain]
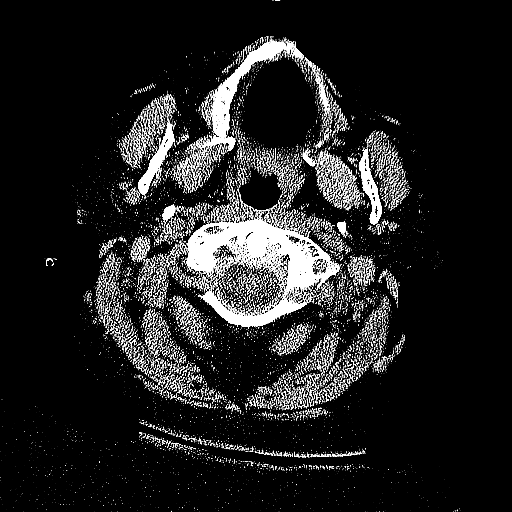
[im 5/85  bone]
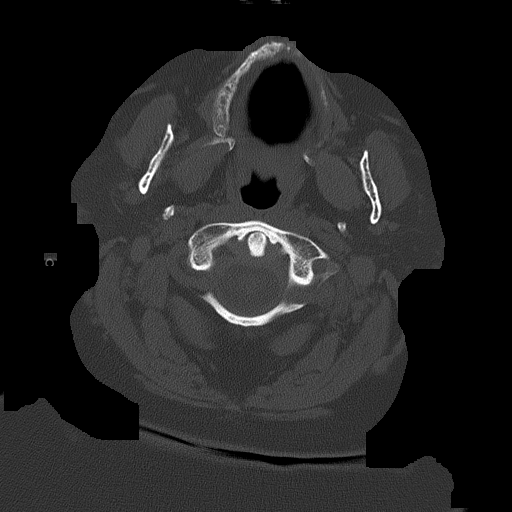
[im 13/85  brain]
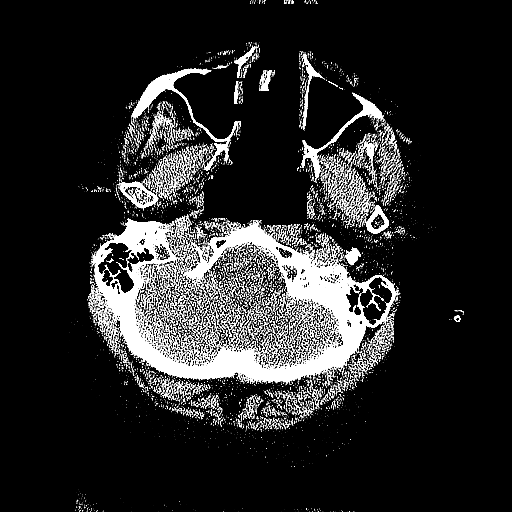
[im 22/85  brain]
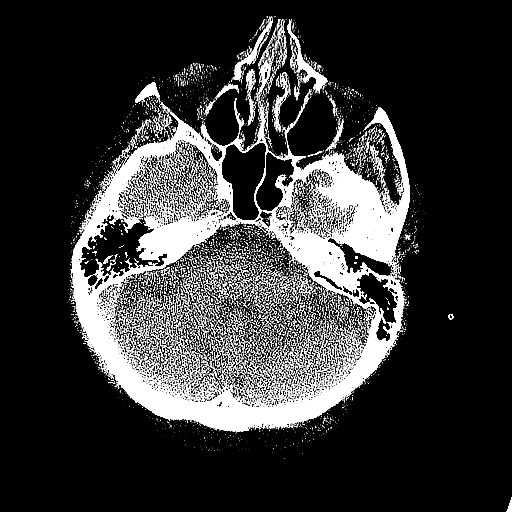
[im 30/85  brain]
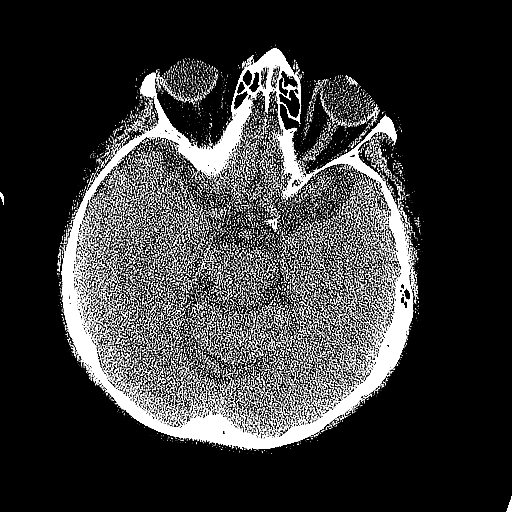
[im 38/85  brain]
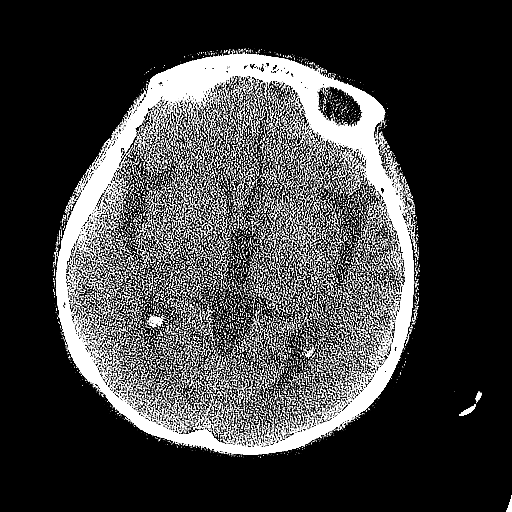
[im 38/85  bone]
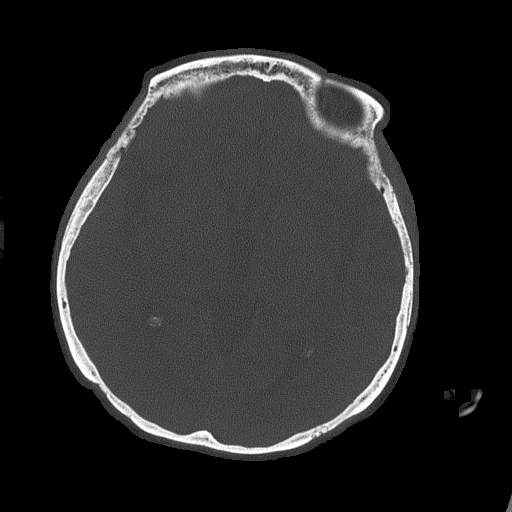
[im 47/85  brain]
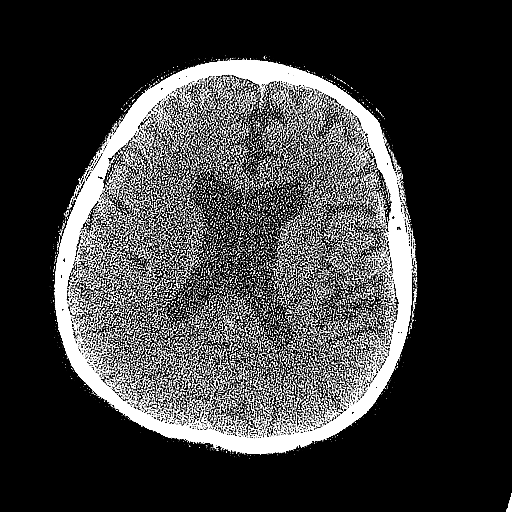
[im 55/85  brain]
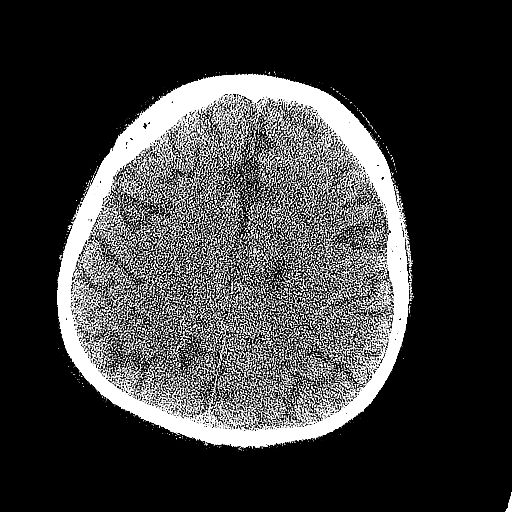
[im 64/85  brain]
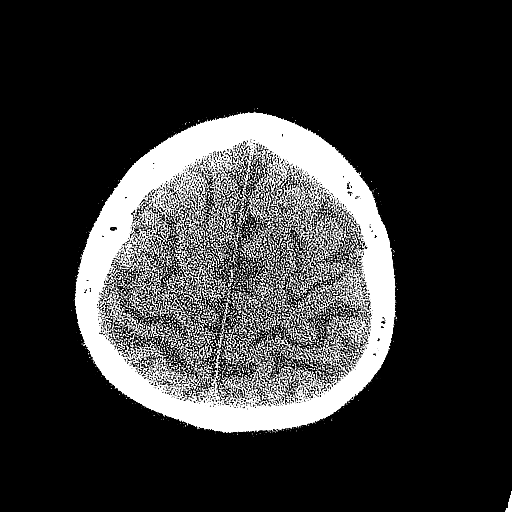
[im 72/85  brain]
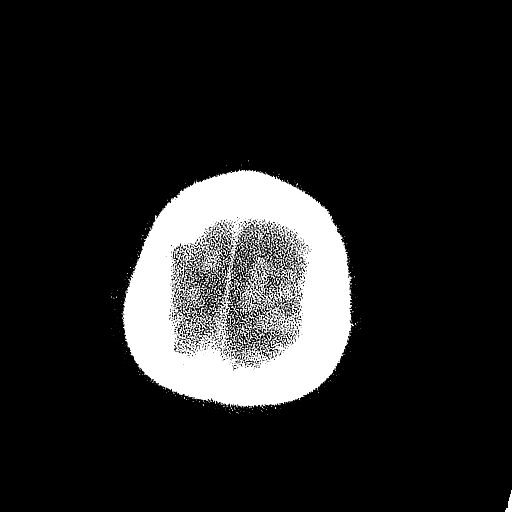
[im 72/85  bone]
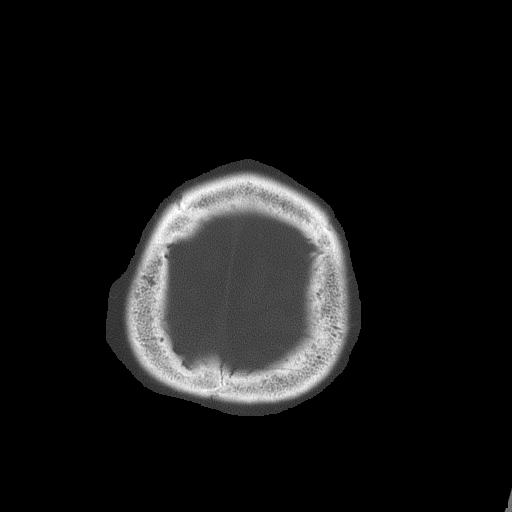
[im 80/85  brain]
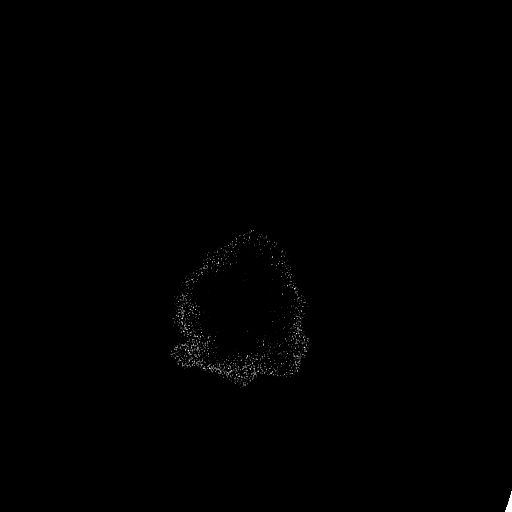

[Series 5: head 3.0 mpr cor · coronal · 0.33mm/px · 3 of 67 slices shown]
[im 23/67  brain]
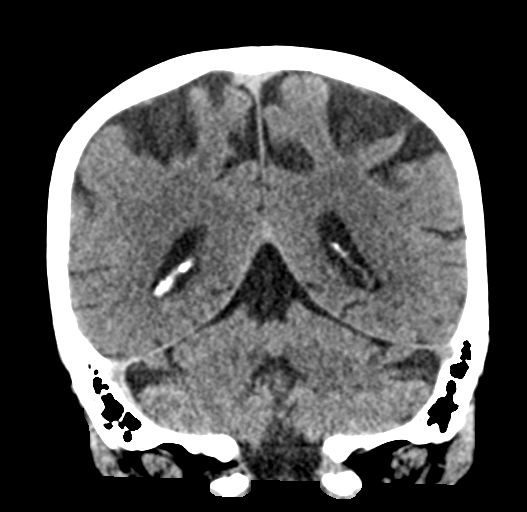
[im 30/67  brain]
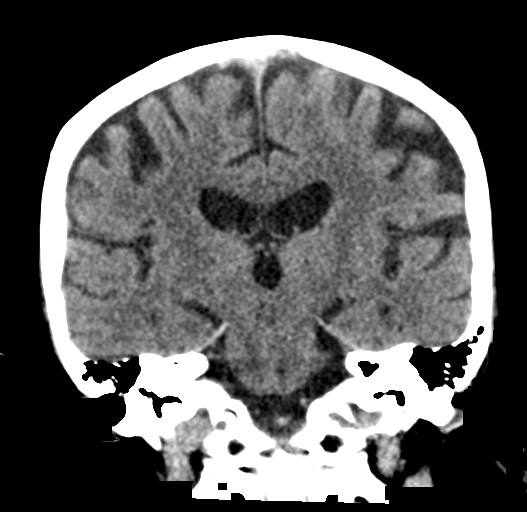
[im 37/67  brain]
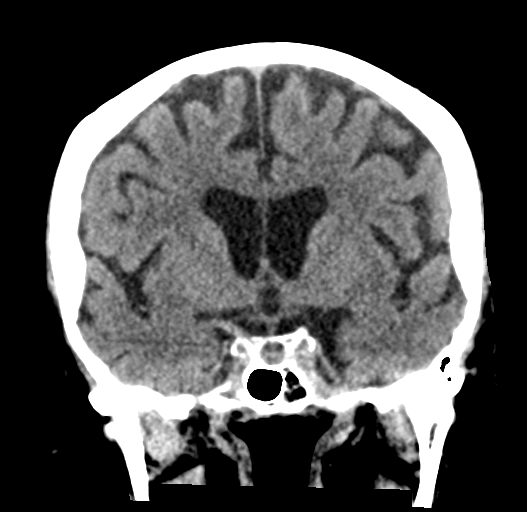

[Series 6: head 3.0 mpr sag · sagittal · 0.33mm/px · 3 of 67 slices shown]
[im 23/67  brain]
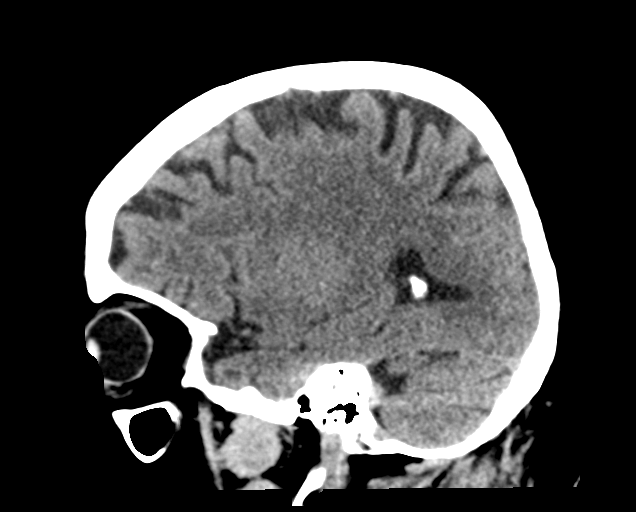
[im 34/67  brain]
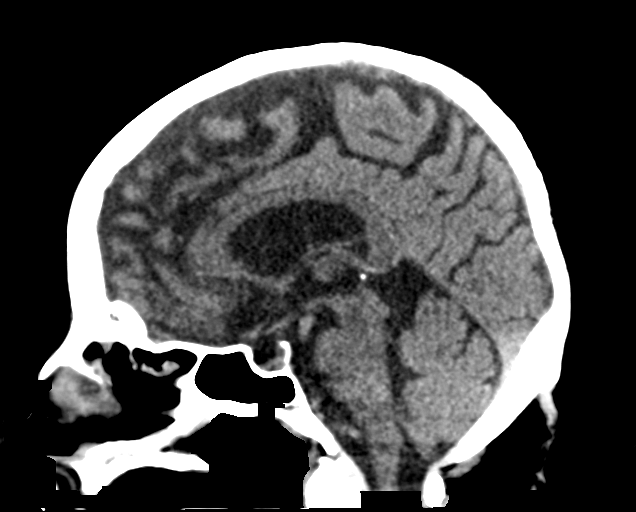
[im 45/67  brain]
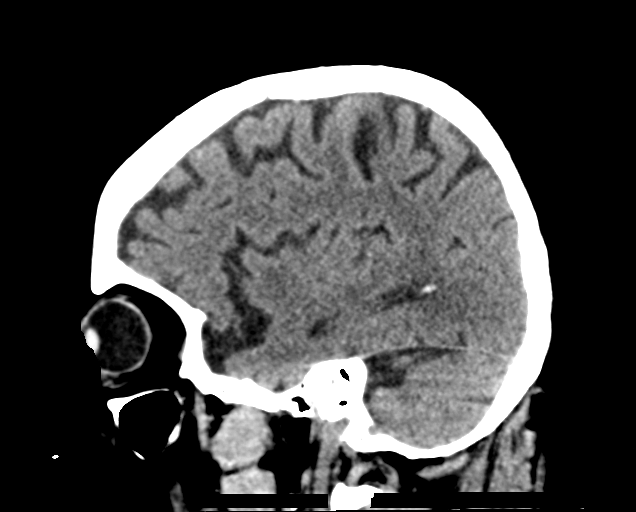

[16 of 47 positions shown; findings below may reference images not displayed]

FINDINGS: Brain: There is mild age-related atrophy and chronic microvascular
ischemic changes. There is no acute intracranial hemorrhage. No mass
effect or midline shift. No extra-axial fluid collection.

Vascular: No hyperdense vessel or unexpected calcification.

Skull: Normal. Negative for fracture or focal lesion.

Sinuses/Orbits: No acute finding.

Other: None
IMPRESSION: 1. No acute intracranial hemorrhage.
2. Mild age-related atrophy and chronic microvascular ischemic
changes.

## 2021-03-27 ENCOUNTER — Telehealth: Payer: Self-pay | Admitting: Licensed Clinical Social Worker

## 2021-03-27 NOTE — Chronic Care Management (AMB) (Signed)
    Clinical Social Work  Care Management  Unsuccessful Phone Outreach    03/27/2021 Name: Claudia Walter MRN: [id number] DOB: [birth year]  Claudia Walter is a 75 y.o. year old female who is a primary care patient of Gladys Damme, MD .   F/U phone call today to assess needs, and progress with care plan goals.   Telephone outreach was unsuccessful A HIPPA compliant phone message was left for the patient providing contact information and requesting a return call.   Plan:CCM LCSW will wait for return call. If no return call is received, Will disconnect from care team in the next in 60 days. .   Review of patient status, including review of consultants reports, relevant laboratory and other test results, and collaboration with appropriate care team members and the patient's provider was performed as part of comprehensive patient evaluation and provision of care management services.    Casimer Lanius, [place] / [place]   [phone] 9:35 AM

## 2021-04-10 ENCOUNTER — Other Ambulatory Visit: Payer: Self-pay | Admitting: Neurology

## 2021-04-11 ENCOUNTER — Telehealth: Payer: Self-pay

## 2021-04-11 NOTE — Telephone Encounter (Signed)
Patient calls nurse line due to productive cough, headache and low grade fever. Patient reports that her son has recently tested positive for COVID. Patient is vaccinated against COVID and has received one booster.   Advised that patient receive COVID testing due to above symptoms. Offered patient appointment in [facility] this afternoon. Patient reports that she will call back if she can find transportation.  Supportive measures provided and ED precautions given.   Talbot Grumbling, RN

## 2021-04-11 NOTE — Telephone Encounter (Signed)
Patient returns call to nurse line. Patient is unable to ride transportation due to COVID like symptoms. Patient states that she is feeling slightly better after taking tylenol.   ED precautions reinforced.   Talbot Grumbling, RN

## 2021-04-20 ENCOUNTER — Other Ambulatory Visit: Payer: Self-pay | Admitting: Family Medicine

## 2021-04-20 DIAGNOSIS — G35 Multiple sclerosis: Secondary | ICD-10-CM

## 2021-05-08 ENCOUNTER — Ambulatory Visit: Payer: Medicare Other | Admitting: Neurology

## 2021-05-16 ENCOUNTER — Encounter: Payer: Medicare Other | Admitting: Physical Medicine and Rehabilitation

## 2021-05-26 ENCOUNTER — Ambulatory Visit: Payer: Medicare Other | Admitting: Podiatry

## 2021-06-26 ENCOUNTER — Other Ambulatory Visit: Payer: Self-pay

## 2021-06-26 MED ORDER — FREESTYLE LITE TEST VI STRP: ORAL_STRIP | 6 refills | Status: DC

## 2021-07-07 ENCOUNTER — Telehealth: Payer: Self-pay | Admitting: Neurology

## 2021-07-07 NOTE — Telephone Encounter (Signed)
Pt request refill buPROPion (WELLBUTRIN) 75 MG tablet at [place] [id number]. Scheduled appt 10/13/21 at 3:00 pm.

## 2021-07-08 MED ORDER — BUPROPION HCL 75 MG PO TABS: 75.0000 mg | ORAL_TABLET | Freq: Every day | ORAL | 0 refills | Status: DC

## 2021-07-08 NOTE — Telephone Encounter (Signed)
90-day refill sent to pharmacy to last until her follow up appt.

## 2021-07-21 NOTE — Progress Notes (Deleted)
    SUBJECTIVE:   CHIEF COMPLAINT / HPI:   DM2: Last A1c 6.2% in September 2021. Current regimen includes metformin 1000 mg BID and pravastatin 20 mg qd. Reminded patient to follow up with ophthalmology annually.   HC maintenance: Counseled regarding COVID booster  PERTINENT  PMH / PSH: DM2, MS, neurogenic bladder  OBJECTIVE:   There were no vitals taken for this visit.  Nursing note and vitals reviewed GEN: *** resting comfortably in chair, NAD, WNWD HEENT: NCAT. PERRLA. Sclera without injection or icterus. MMM.  Neck: Supple.  Cardiac: Regular rate and rhythm. Normal S1/S2. No murmurs, rubs, or gallops appreciated. 2+ radial pulses. Lungs: Clear bilaterally to ascultation. No increased WOB, no accessory muscle usage. No w/r/r. Abdomen: Normoactive bowel sounds. No tenderness to deep or light palpation. No rebound or guarding.  ***  Neuro: AOx3 *** Ext: no edema Psych: Pleasant and appropriate    ASSESSMENT/PLAN:   No problem-specific Assessment & Plan notes found for this encounter.     Claudia Damme, MD [place]

## 2021-07-22 ENCOUNTER — Ambulatory Visit: Payer: Medicare Other | Admitting: Family Medicine

## 2021-07-22 DIAGNOSIS — Z794 Long term (current) use of insulin: Secondary | ICD-10-CM

## 2021-08-29 ENCOUNTER — Other Ambulatory Visit: Payer: Self-pay | Admitting: Family Medicine

## 2021-08-29 DIAGNOSIS — G35 Multiple sclerosis: Secondary | ICD-10-CM

## 2021-09-04 ENCOUNTER — Other Ambulatory Visit: Payer: Self-pay | Admitting: *Deleted

## 2021-09-04 MED ORDER — GABAPENTIN 300 MG PO CAPS: 300.0000 mg | ORAL_CAPSULE | Freq: Two times a day (BID) | ORAL | 1 refills | Status: AC

## 2021-09-12 ENCOUNTER — Telehealth: Payer: Self-pay

## 2021-09-12 MED ORDER — FREESTYLE LITE TEST VI STRP: ORAL_STRIP | 6 refills | Status: AC

## 2021-09-12 NOTE — Telephone Encounter (Signed)
Patient calls nurse line reporting problems obtaining test strips.   Patient has medicare and frequency and diagnoses code need to be added.   I have resent.

## 2021-10-04 ENCOUNTER — Other Ambulatory Visit: Payer: Self-pay | Admitting: Neurology

## 2021-10-13 ENCOUNTER — Ambulatory Visit: Payer: PRIVATE HEALTH INSURANCE | Admitting: Neurology

## 2021-10-21 ENCOUNTER — Ambulatory Visit: Payer: Medicare Other | Admitting: Neurology

## 2021-11-11 ENCOUNTER — Ambulatory Visit: Payer: Medicare Other | Admitting: Neurology

## 2021-11-27 ENCOUNTER — Telehealth: Payer: Self-pay

## 2021-11-27 NOTE — Telephone Encounter (Signed)
[place] RN calls nurse line reporting constipation at home visit today. [name] reports last BM was Sunday, however reports a small one today. [name] reports the patient has been taking doculax gummies with minimal relief. [name] denies nausea/vomiting, abdominal pain or distention.   Patient contacted and advised to drink plenty of fluids. Patient is requesting OTC recommendations from PCP.   Patient has not been seen since 2021. Apt scheduled with PCP for next week.   Red flags discussed.

## 2021-11-27 NOTE — Telephone Encounter (Signed)
Agree with management, pt does need follow up. Can try miralax 1-2 capfulls once or twice a day with 4 oz of fluid.  Gladys Damme, MD [facility], PGY-3

## 2021-12-05 ENCOUNTER — Other Ambulatory Visit: Payer: Self-pay

## 2021-12-05 ENCOUNTER — Encounter: Payer: Self-pay | Admitting: Family Medicine

## 2021-12-05 ENCOUNTER — Ambulatory Visit (INDEPENDENT_AMBULATORY_CARE_PROVIDER_SITE_OTHER): Payer: Medicare Other | Admitting: Family Medicine

## 2021-12-05 VITALS — BP 127/63 | HR 91

## 2021-12-05 DIAGNOSIS — L89152 Pressure ulcer of sacral region, stage 2: Secondary | ICD-10-CM

## 2021-12-05 DIAGNOSIS — Z749 Problem related to care provider dependency, unspecified: Secondary | ICD-10-CM | POA: Diagnosis not present

## 2021-12-05 DIAGNOSIS — N1831 Chronic kidney disease, stage 3a: Secondary | ICD-10-CM

## 2021-12-05 DIAGNOSIS — R634 Abnormal weight loss: Secondary | ICD-10-CM

## 2021-12-05 DIAGNOSIS — E118 Type 2 diabetes mellitus with unspecified complications: Secondary | ICD-10-CM | POA: Diagnosis present

## 2021-12-05 LAB — POCT GLYCOSYLATED HEMOGLOBIN (HGB A1C): HbA1c, POC (controlled diabetic range): 6 % (ref 0.0–7.0)

## 2021-12-05 NOTE — Assessment & Plan Note (Signed)
Chronic, controlled. A1c stable at 6%. Currently on metformin 500 mg daily. Will send strip rx to pharmacy. No change in regimen.

## 2021-12-05 NOTE — Assessment & Plan Note (Signed)
Unable to assess at visit today as no way of moving patient in her chair to visualize. Requested that patient bring her son to next visit. I will speak with home health nursing to get update on progress. Recommend patient follow up in 3-4 weeks and will attempt to visualize then.

## 2021-12-05 NOTE — Assessment & Plan Note (Signed)
Discussed options with patient for renal protection including SGLT2i or ARB. Patient does not wish to start new medications today. I will check in with her nephrologist on recent lab work. Urine micro pending.
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
## 2021-12-05 NOTE — Patient Instructions (Addendum)
It was a pleasure to see you today!  Your diabetes is well controlled. I will send your glucometer strips to the pharmacy. Keep up eating the fruit for your constipation. Also you can try miralax if needed. I have placed a referral for social work. You should receive a phone call in 1-2 weeks to discuss your needs. If for any reason you do not receive a phone call or need more help scheduling this appointment, please call our office at [phone]. I recommend you try ensure for extra calories. If you can't afford or get ensure, you can try carnation instant breakfast in 8 oz of milk Follow up in 4 weeks to check in on weight    Be Well,  Dr. Chauncey [place]
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
## 2021-12-06 LAB — MICROALBUMIN / CREATININE URINE RATIO
Creatinine, Urine: 18.7 mg/dL
Microalb/Creat Ratio: 141 mg/g creat — ABNORMAL HIGH (ref 0–29)
Microalbumin, Urine: 26.4 ug/mL

## 2021-12-08 ENCOUNTER — Telehealth: Payer: Self-pay | Admitting: *Deleted

## 2021-12-08 NOTE — Chronic Care Management (AMB) (Signed)
°  Care Management   Outreach Note  12/08/2021 Name: Claudia Walter MRN: [id number] DOB: 17-Sep-1946  Referred by: Gladys Damme, MD Reason for referral : Care Coordination (Initial outreach to schedule referral with BSW )   An unsuccessful telephone outreach was attempted today. The patient was referred to the case management team for assistance with care management and care coordination.   Follow Up Plan:  A HIPAA compliant phone message was left for the patient providing contact information and requesting a return call. The care management team will reach out to the patient again over the next 7 days. If patient returns call to provider office, please advise to call [facility] at [phone].  [facility] Management  Direct Dial: [phone]

## 2021-12-09 NOTE — Chronic Care Management (AMB) (Signed)
°  Care Management   Note  12/09/2021 Name: Claudia Walter MRN: [id number] DOB: September 07, 1946  Claudia Walter is a 76 y.o. year old female who is a primary care patient of Gladys Damme, MD. I reached out to [facility] by phone today in response to a referral sent by [name]'s primary care provider.   [name] was given information about care management services today including:  Care management services include personalized support from designated clinical staff supervised by her physician, including individualized plan of care and coordination with other care providers 24/7 contact phone numbers for assistance for urgent and routine care needs. The patient may stop care management services at any time by phone call to the office staff.  Patient agreed to services and verbal consent obtained.   Follow up plan: Telephone appointment with care management team member scheduled for:12/15/21  [place] Management  Direct Dial: [phone]

## 2021-12-15 ENCOUNTER — Telehealth: Payer: Medicare Other

## 2021-12-23 ENCOUNTER — Telehealth: Payer: Self-pay | Admitting: Licensed Clinical Social Worker

## 2021-12-23 NOTE — Telephone Encounter (Signed)
°  Care Management   Follow Up Note   12/15/2021 Name: Claudia Walter MRN: [id number] DOB: 11-Dec-1945   Referred by: Gladys Damme, MD Reason for referral : No chief complaint on file.   An unsuccessful telephone outreach was attempted today. The patient was referred to the case management team for assistance with care management and care coordination.  SW contacted patient at 1:15 pm and 2:52 Pm. SW left VM. SW will continue outreach efforts within the next 30 days.   [name], [place]  Social Worker [facility] Care Management  [phone]

## 2021-12-24 ENCOUNTER — Telehealth: Payer: Self-pay | Admitting: *Deleted

## 2021-12-24 NOTE — Chronic Care Management (AMB) (Signed)
?  Care Management  ? ?Note ? ?12/24/2021 ?Name: Claudia Walter MRN: [id number] DOB: August 16, 1946 ? ?Claudia Walter is a 76 y.o. year old female who is a primary care patient of Gladys Damme, MD and is actively engaged with the care management team. I reached out to [facility] by phone today to assist with re-scheduling an initial visit with the BSW ? ?Follow up plan: ?Unsuccessful telephone outreach attempt made. A HIPAA compliant phone message was left for the patient providing contact information and requesting a return call.  ?The care management team will reach out to the patient again over the next 7 days.  ?If patient returns call to provider office, please advise to call [place] at [phone]. ? ?Claudia Walter  ?Care Guide, Embedded Care Coordination ?[place]  Care Management  ?Direct Dial: [phone] ? ?

## 2021-12-24 NOTE — Chronic Care Management (AMB) (Signed)
?  Care Management  ? ?Note ? ?12/24/2021 ?Name: Claudia Walter MRN: [id number] DOB: Aug 15, 1946 ? ?Claudia Walter is a 75 y.o. year old female who is a primary care patient of Gladys Damme, MD and is actively engaged with the care management team. I reached out to [facility] by phone today to assist with re-scheduling an initial visit with the BSW ? ?Follow up plan: ?Telephone appointment with care management team member scheduled for:01/01/22 ? ?Laverda Sorenson  ?Care Guide, Embedded Care Coordination ?[place]  Care Management  ?Direct Dial: [phone] ? ?

## 2021-12-25 ENCOUNTER — Other Ambulatory Visit: Payer: Self-pay | Admitting: Neurology

## 2021-12-29 ENCOUNTER — Telehealth: Payer: Medicare Other | Admitting: Licensed Clinical Social Worker

## 2022-01-01 ENCOUNTER — Telehealth: Payer: Medicare Other | Admitting: Licensed Clinical Social Worker

## 2022-01-01 ENCOUNTER — Telehealth: Payer: Self-pay | Admitting: Licensed Clinical Social Worker

## 2022-01-01 NOTE — Telephone Encounter (Signed)
?  Care Management  ? ?Follow Up Note ? ? ?01/01/2022 ?Name: Claudia Walter MRN: [id number] DOB: [birth year] ? ? ?Referred by: Gladys Damme, MD ?Reason for referral : No chief complaint on file. ? ? ?Third unsuccessful telephone outreach was attempted today. The patient was referred to the case management team for assistance with care management and care coordination. The patient's primary care provider has been notified of our unsuccessful attempts to make or maintain contact with the patient. The care management team is pleased to engage with this patient at any time in the future should he/she be interested in assistance from the care management team.  ? ?Follow Up Plan: The patient has been provided with contact information for the care management team and has been advised to call with any health related questions or concerns.  ? ?SW left a VM with contact information for patient to call if needed. SW attempted patient twice on today.  ? ?[name], BSW  ?Social Worker ?[facility] Care Management  ?[phone] ?  ?

## 2022-01-02 ENCOUNTER — Telehealth: Payer: Self-pay | Admitting: Family Medicine

## 2022-01-02 DIAGNOSIS — F0630 Mood disorder due to known physiological condition, unspecified: Secondary | ICD-10-CM

## 2022-01-02 MED ORDER — BUPROPION HCL 75 MG PO TABS: ORAL_TABLET | ORAL | 3 refills | Status: AC

## 2022-01-02 NOTE — Telephone Encounter (Signed)
Called patient to discuss changing upcoming appt. She is not happy in her current situation as her son is her caregiver and does not stay with her to take care of her. She has had significant weight loss. Her sisters, niece, and other son are coming from [place] to take her back this weekend, which will likely be a better living situation as she will have 24h care.  ? ?If patient does not move this weekend, she will schedule a follow up with me when available. ? ?Gladys Damme, MD ?[facility], PGY-3 ? ?

## 2022-01-16 ENCOUNTER — Ambulatory Visit: Payer: Medicare Other | Admitting: Family Medicine

## 2022-04-02 ENCOUNTER — Other Ambulatory Visit: Payer: Self-pay | Admitting: Family Medicine

## 2022-10-08 ENCOUNTER — Ambulatory Visit: Payer: Self-pay | Admitting: Licensed Clinical Social Worker

## 2022-10-08 NOTE — Patient Outreach (Signed)
SW removed self from care team.   Emilio Baylock, BSW, MSW, LCSW-A  Social Worker [facility] Care Management  [phone] 

## 2023-03-03 ENCOUNTER — Telehealth: Payer: Self-pay | Admitting: Family Medicine

## 2023-03-03 NOTE — Telephone Encounter (Signed)
Contacted Claudia Walter to schedule their annual wellness visit. Patient declined to schedule AWV at this time.  I spoke with patient and patient moved to [place] over a year ago.  Thank you,  [facility] Support [facility] Direct dial  [phone]
